# Patient Record
Sex: Female | Born: 1985
Health system: Southern US, Community
[De-identification: ages and names within clinical notes are randomized; demographics above are authoritative.]

## PROBLEM LIST (undated history)

## (undated) ENCOUNTER — Inpatient Hospital Stay (HOSPITAL_COMMUNITY): Payer: Self-pay

## (undated) DIAGNOSIS — R51 Headache: Secondary | ICD-10-CM

## (undated) DIAGNOSIS — F53 Postpartum depression: Secondary | ICD-10-CM

## (undated) DIAGNOSIS — R519 Headache, unspecified: Secondary | ICD-10-CM

## (undated) DIAGNOSIS — G7000 Myasthenia gravis without (acute) exacerbation: Secondary | ICD-10-CM

## (undated) DIAGNOSIS — O99345 Other mental disorders complicating the puerperium: Secondary | ICD-10-CM

## (undated) HISTORY — DX: Headache, unspecified: R51.9

## (undated) HISTORY — DX: Myasthenia gravis without (acute) exacerbation: G70.00

## (undated) HISTORY — DX: Other mental disorders complicating the puerperium: O99.345

## (undated) HISTORY — DX: Postpartum depression: F53.0

## (undated) HISTORY — PX: WISDOM TOOTH EXTRACTION: SHX21

## (undated) HISTORY — DX: Headache: R51

---

## 2014-07-03 NOTE — L&D Delivery Note (Signed)
Operative Delivery Note At 6:49 PM a viable and healthy female was delivered via Vaginal, Vacuum Neurosurgeon).  Presentation: vertex; Position: Occiput,, Anterior; Station: +3.  Verbal consent: obtained from patient.  Risks and benefits discussed in detail.  Risks include, but are not limited to the risks of anesthesia, bleeding, infection, damage to maternal tissues, fetal cephalhematoma.  There is also the risk of inability to effect vaginal delivery of the head, or shoulder dystocia that cannot be resolved by established maneuvers, leading to the need for emergency cesarean section.  APGAR: 9, 9; weight  6 lb 1 oz  Placenta status: Abnormal, Manual removal Pathology.   Cord: 3 vessels with the following complications: None.  Cord pH: none  Anesthesia: Epidural  Instruments: kiwi Episiotomy: None Lacerations: Labial minora( left( superficial) not bleeding Suture Repair: n/a Est. Blood Loss (mL): 250  Mom to postpartum.  Baby to Couplet care / Skin to Skin.  [name] A 05/20/2015, 8:09 PM

## 2014-10-13 LAB — US OB TRANSVAGINAL

## 2014-10-30 LAB — OB RESULTS CONSOLE RPR: RPR: NONREACTIVE

## 2014-10-30 LAB — OB RESULTS CONSOLE RUBELLA ANTIBODY, IGM: RUBELLA: IMMUNE

## 2014-10-30 LAB — OB RESULTS CONSOLE HEPATITIS B SURFACE ANTIGEN: Hepatitis B Surface Ag: NEGATIVE

## 2014-10-30 LAB — OB RESULTS CONSOLE HIV ANTIBODY (ROUTINE TESTING): HIV: NONREACTIVE

## 2014-11-06 ENCOUNTER — Ambulatory Visit (HOSPITAL_COMMUNITY): Payer: Self-pay

## 2014-11-13 ENCOUNTER — Ambulatory Visit (HOSPITAL_COMMUNITY)
Admission: RE | Admit: 2014-11-13 | Discharge: 2014-11-13 | Disposition: A | Discharge status: Home / Self Care | Payer: 59 | Source: Ambulatory Visit | Attending: Obstetrics | Admitting: Obstetrics

## 2014-11-13 ENCOUNTER — Encounter (HOSPITAL_COMMUNITY): Payer: Self-pay

## 2014-11-13 DIAGNOSIS — Z3A12 12 weeks gestation of pregnancy: Secondary | ICD-10-CM | POA: Insufficient documentation

## 2014-11-13 DIAGNOSIS — O9989 Other specified diseases and conditions complicating pregnancy, childbirth and the puerperium: Secondary | ICD-10-CM | POA: Insufficient documentation

## 2014-11-13 DIAGNOSIS — O401XX0 Polyhydramnios, first trimester, not applicable or unspecified: Secondary | ICD-10-CM | POA: Diagnosis not present

## 2014-11-13 DIAGNOSIS — G7000 Myasthenia gravis without (acute) exacerbation: Secondary | ICD-10-CM | POA: Diagnosis not present

## 2014-11-13 DIAGNOSIS — Z8739 Personal history of other diseases of the musculoskeletal system and connective tissue: Secondary | ICD-10-CM | POA: Insufficient documentation

## 2014-11-13 NOTE — Consult Note (Signed)
Maternal Fetal Medicine Consultation  Requesting Provider(s): Aloha Gell, MD  Reason for consultation: Myasthenia Gravis  HPI: Robin Robertson is a 29 yo G1P0, EDD 05/26/2015 who is currently at 12w 2d seen for consultation due to a personal history of Myasthenia Gravis.  The patient reports that she was first diagnosed with MG at age 19.  She reports that most of her symptoms have been associated with "asthma-like" symptoms and chronic muscle fatigue.  She has some chronic ptosis of the right eye.  [name] reports that she has never had a myasthenia crisis. She was previously followed by an Neuro-opthamologist in [place], but has not yet established care with a neurologist in the area.  She was previously on pyridostigmine, but has been off of it for approximately one year.  She is without complaints today.  Her prenatal course thus far has been uncomplicated.  OB History: OB History    Gravida Para Term Preterm AB TAB SAB Ectopic Multiple Living   1               PMH: Myasthenia Gravis, Migraine headaches  PSH: Wisdom teeth extraction  Meds: Prenatal vitamins  Allergies:  Allergies  Allergen Reactions  . Fentanyl   . Influenza Vaccines   . Vicodin [Hydrocodone-Acetaminophen]    FH: Ischemic heart disease, elevated cholesterol, hypertension - father; CVA - mother  Soc:  Denies tobacco or ETOH use   Review of Systems: no vaginal bleeding or cramping/contractions, no LOF, no nausea/vomiting. All other systems reviewed and are negative.   PE:  Wt: 109 lbs, 109/64, 70  A/P:  1) Single IUP at 12w 2d  2) Known history of MG - Pregnancy has a variable effect on the course of myasthenia gravis.  The first trimester and post partum time frames appear to be associated with the highest risk of exacerbations.  The patient recently moved to the area, and has not yet established care with a Neurologist.  She has an appointment next week, and would await their input for  further management.     Recommendations: 1) Neurology consultation for further management.  Anticholinesterase agents (pyridostigmine) is the standard, first line agent.  Dose adjustments may be required in pregnancy due to increased renal clearance.  Glucocorticoids, azathioprine or cyclosporine may be used if anticholinesterases fail to control MG exacerbations and are relatively safe in pregnancy. 2) Baseline pulmonary function tests - particularly in light of the patients history of respiratory issues / "asthma- like" symptoms.  Complaints of dyspnea and cough should prompt evaluation for possible myasthenic flares and respiratory muscle weakness 3) Thyroid function tests as there is an association between MG and other autoimmune disorders 4) All infections (UTI) should be treated promptly - women with underlying infections will frequently develop MG exacerbations 4)  Magnesium sulfate is contraindicated.  Hypertension can be treated with methydopa or hydralazine - avoid calcium channel blockers and beta blockers if possible.  Keppra can be used for seizure prophylaxis (Dilantin should be reserved for refractory seizures as it can potentially exacerbate weakness) 5) Recommend anesthesia consult (ideally prior to the 3rd trimester)  - particularly with the fact that the patient has some opioid allergies.  This in addition to her MG, labor analgesia is very limited.  Regional anesthesia is not contraindicated in mild to moderate disease and my be the analgesia of choice for anticipated vaginal delivery. 6) The second stage of labor may be problematic - would consider giving anticholinesterase agents parenterally.  May benefit from  operative vaginal delivery. 7) The newborn is at risk for transient MG (occurs in 10-20% of infants born to [facility] mothers) - notify Peds.  May require additional observation for the first 48-72 hours of life. 8) Fetal assessment may be affected by MG.  The perception of fetal  movement may be altered and NSTs are often inconclusive.  Decreased fetal movement and breathing activity have been described.  Polyhydramnios had been reported due to poor fetal swallowing.     Thank you for the opportunity to be a part of the care of [place]. Please contact our office if we can be of further assistance.   I spent approximately 30 minutes with this patient with over 50% of time spent in face-to-face counseling.  Benjaman Lobe, MD Maternal Fetal Medicine

## 2014-11-16 ENCOUNTER — Other Ambulatory Visit (HOSPITAL_COMMUNITY): Payer: Self-pay | Admitting: Obstetrics

## 2014-11-17 ENCOUNTER — Other Ambulatory Visit (HOSPITAL_COMMUNITY): Payer: Self-pay | Admitting: Obstetrics

## 2014-11-17 ENCOUNTER — Encounter (HOSPITAL_COMMUNITY): Payer: Self-pay | Admitting: Obstetrics

## 2014-11-24 ENCOUNTER — Encounter: Payer: Self-pay | Admitting: Neurology

## 2014-11-24 ENCOUNTER — Ambulatory Visit (INDEPENDENT_AMBULATORY_CARE_PROVIDER_SITE_OTHER): Payer: 59 | Admitting: Neurology

## 2014-11-24 VITALS — BP 100/59 | HR 83 | Ht 64.0 in | Wt 111.4 lb

## 2014-11-24 DIAGNOSIS — G7000 Myasthenia gravis without (acute) exacerbation: Secondary | ICD-10-CM

## 2014-11-24 LAB — OB RESULTS CONSOLE GC/CHLAMYDIA
Chlamydia: NEGATIVE
GC PROBE AMP, GENITAL: NEGATIVE

## 2014-11-24 NOTE — Progress Notes (Signed)
Reason for visit: Myasthenia gravis  Referring physician: Dr. Dayna Ramus Robin is a 29 y.o. female  History of present illness:  Robin Robertson is a 29 year old right-handed white female with a history of myasthenia gravis with ocular features. The patient was diagnosed 4 years ago while living in [place]. The patient moved to this area about a year ago. She was on Mestinon prior to moving, she has been off the medication for about one year. The patient mainly has ptosis involving the right eye only, she denies any double vision. She has seronegative disease, the diagnosis of made in that she had a response to Mestinon with improvement of the ptosis. The patient does not believe that she ever had a CT scan of the chest. She denies any generalized weakness, and she denies difficulty with chewing or swallowing. She does report some generalized fatigue at times, and she may have some slight shortness of breath. She currently is [redacted] weeks pregnant, her due date is on 05/26/2015. The patient is sent to this office for further evaluation.  Past Medical History  Diagnosis Date  . Myasthenia gravis   . Headache     Past Surgical History  Procedure Laterality Date  . Wisdom tooth extraction      Family History  Problem Relation Age of Onset  . Hypertension Father   . Cerebrovascular Disease Maternal Grandmother   . Heart disease Paternal Grandmother   . Myasthenia gravis Paternal Grandmother   . Hypertension Paternal Grandfather     Social history:  reports that she has never smoked. She has never used smokeless tobacco. She reports that she does not drink alcohol or use illicit drugs.  Medications:  Prior to Admission medications   Medication Sig Start Date End Date Taking? Authorizing Provider  Prenatal Vit-Fe Fumarate-FA (PRENATAL MULTIVITAMIN) TABS tablet Take 1 tablet by mouth daily at 12 noon.   Yes Historical Provider, MD      Allergies  Allergen  Reactions  . Fentanyl   . Influenza Vaccines   . Vicodin [Hydrocodone-Acetaminophen]     ROS:  Out of a complete 14 system review of symptoms, the patient complains only of the following symptoms, and all other reviewed systems are negative.  Fatigue Shortness of breath Migraine  Blood pressure 100/59, pulse 83, height 5\' 4"  (1.626 m), weight 111 lb 6.4 oz (50.531 kg), last menstrual period 08/19/2014.  Physical Exam  General: The patient is alert and cooperative at the time of the examination.  Eyes: Pupils are equal, round, and reactive to light. Discs are flat bilaterally.  Neck: The neck is supple, no carotid bruits are noted.  Respiratory: The respiratory examination is clear.  Cardiovascular: The cardiovascular examination reveals a regular rate and rhythm, no obvious murmurs or rubs are noted.  Skin: Extremities are without significant edema.  Neurologic Exam  Mental status: The patient is alert and oriented x 3 at the time of the examination. The patient has apparent normal recent and remote memory, with an apparently normal attention span and concentration ability.  Cranial nerves: Facial symmetry is not present. Prominent ptosis of the right eye is noted. There is good sensation of the face to pinprick and soft touch bilaterally. The strength of the facial muscles and the muscles to head turning and shoulder shrug are normal bilaterally. Speech is well enunciated, no aphasia or dysarthria is noted. Extraocular movements are full. Visual fields are full. The tongue is midline, and the patient has  symmetric elevation of the soft palate. No obvious hearing deficits are noted. With superior gaze for 1 minute, no increased ptosis is noted on the right. No divergence of gaze is noted, no reports of double vision is noted.  Motor: The motor testing reveals 5 over 5 strength of all 4 extremities. Good symmetric motor tone is noted throughout. With arms outstretched 1 minute, no  fatigable weakness of the deltoid muscles is noted on either side.  Sensory: Sensory testing is intact to pinprick, soft touch, vibration sensation, and position sense on all 4 extremities. No evidence of extinction is noted.  Coordination: Cerebellar testing reveals good finger-nose-finger and heel-to-shin bilaterally.  Gait and station: Gait is normal. Tandem gait is normal. Romberg is negative. No drift is seen.  Reflexes: Deep tendon reflexes are symmetric and normal bilaterally. Toes are downgoing bilaterally.   Assessment/Plan:  1. Myasthenia gravis, ocular features  2. Migraine headache  The patient has been remarkably stable with her myasthenia gravis with right-sided ptosis. The patient can be treated with Mestinon if needed. The patient has had ocular disease for 4 years, the chance of generalization of her myasthenia is low. For now, we will follow the patient conservatively. She will follow-up in about 6 months.    Jill Alexanders MD 11/24/2014 8:19 PM  [facility] [address] [place], [place] [address]  Phone [phone] Fax [phone]

## 2014-11-24 NOTE — Patient Instructions (Signed)
Myasthenia Gravis Myasthenia gravis is a disease that causes muscle weakness throughout the body. The muscles affected are the ones we can control (voluntary muscles). An example of a voluntary muscle is your hand muscles. You can control the muscles to make the hand pick something up. An example of an involuntary muscle is the heart. The heart beats without any direction from you.  Myasthenia Gravis is thought to be an autoimmune disease. That means that normal defenses of the body begin to attack the body. In this case, the immune system begins to attack cells located at the junctions of the muscles and the nerves. Women are affected more often. Women are affected at a younger age than men. Babies born to affected women frequently develop symptoms at an early age. SYMPTOMS Initially in the disease, the facial muscles are affected first. After this, a person may develop droopy eyelids. They may have difficulty controlling facial muscles. They may have problems chewing. Swallowing and speaking may become impaired. The weakness gradually spreads to the arms and legs. It begins to affect breathing. Sometimes, the symptoms lessen or go away without any apparent cause. DIAGNOSIS  Diagnosis can be made with blood tests. Tests such as electromyography may be done to examine the electrical activity in the muscle. An improvement in symptoms after having an anti-cholinesterase drug helps confirm the diagnosis.  TREATMENT  Medicines are usually prescribed as the first treatment. These medicines help, but they do not cure the disease. A plasma cleansing procedure (plasmapheresis) can be used to treat a crisis. It can also be used to prepare a person for surgery. This procedure produces short-term improvement. Some cases are helped by removing the thymus gland. Steroids are used for short-term benefits. Document Released: 09/25/2000 Document Revised: 09/11/2011 Document Reviewed: 08/20/2013 [employer] Patient  Information 2015 [employer]. This information is not intended to replace advice given to you by your health care provider. Make sure you discuss any questions you have with your health care provider.  

## 2014-12-25 ENCOUNTER — Telehealth: Payer: Self-pay | Admitting: *Deleted

## 2014-12-25 DIAGNOSIS — G7000 Myasthenia gravis without (acute) exacerbation: Secondary | ICD-10-CM

## 2014-12-25 NOTE — Telephone Encounter (Signed)
Order placed for PFT. 

## 2014-12-28 ENCOUNTER — Ambulatory Visit (INDEPENDENT_AMBULATORY_CARE_PROVIDER_SITE_OTHER): Payer: 59 | Admitting: Internal Medicine

## 2014-12-28 ENCOUNTER — Encounter (INDEPENDENT_AMBULATORY_CARE_PROVIDER_SITE_OTHER): Payer: Self-pay

## 2014-12-28 DIAGNOSIS — G7000 Myasthenia gravis without (acute) exacerbation: Secondary | ICD-10-CM | POA: Diagnosis not present

## 2014-12-28 LAB — PULMONARY FUNCTION TEST
DL/VA % pred: 104 %
DL/VA: 5.01 ml/min/mmHg/L
DLCO unc % pred: 100 %
DLCO unc: 24.35 ml/min/mmHg
FEF 25-75 Pre: 2.99 L/sec
FEF2575-%PRED-PRE: 85 %
FEV1-%PRED-PRE: 90 %
FEV1-PRE: 2.9 L
FEV1FVC-%Pred-Pre: 98 %
FEV6-%Pred-Pre: 91 %
FEV6-Pre: 3.43 L
FEV6FVC-%Pred-Pre: 101 %
FVC-%PRED-PRE: 91 %
FVC-PRE: 3.45 L
PRE FEV6/FVC RATIO: 100 %
Pre FEV1/FVC ratio: 84 %
RV % PRED: 125 %
RV: 1.72 L
TLC % pred: 100 %
TLC: 5.06 L

## 2014-12-28 NOTE — Progress Notes (Signed)
PFT done today. 

## 2014-12-30 NOTE — Progress Notes (Signed)
Quick Note:  Spoke with pt and notified of results per Dr. Young. Pt verbalized understanding and denied any questions.  ______ 

## 2014-12-30 NOTE — Progress Notes (Signed)
Quick Note:  LMTCB ______ 

## 2015-04-22 ENCOUNTER — Other Ambulatory Visit: Payer: Self-pay | Admitting: Obstetrics

## 2015-04-28 LAB — OB RESULTS CONSOLE GBS: STREP GROUP B AG: NEGATIVE

## 2015-05-07 ENCOUNTER — Encounter (HOSPITAL_COMMUNITY): Payer: Self-pay | Admitting: Anesthesiology

## 2015-05-07 ENCOUNTER — Encounter (HOSPITAL_COMMUNITY)
Admission: RE | Admit: 2015-05-07 | Discharge: 2015-05-07 | Disposition: A | Discharge status: Home / Self Care | Payer: 59 | Source: Ambulatory Visit | Attending: Obstetrics | Admitting: Obstetrics

## 2015-05-07 NOTE — Progress Notes (Signed)
63 YRO 37 week IUP for consult regarding Myasthenia Gravis and Epidural.  She was taken off her Myasthenia meds during the pregnancy and is doing well.  She had an episode of respiratory depression with Fentanyl at age 29 during a MRI and was also concerned about the Fentanyl in the Epidural.  I told her that we did not do anything different with an Epidural in a Myasthenic patient other than possibly decreasing the infusion rate.  I advised her to inform her anesthesiologist about her concerns with  Fentanyl at time of procedure.

## 2015-05-20 ENCOUNTER — Encounter (HOSPITAL_COMMUNITY): Payer: Self-pay | Admitting: *Deleted

## 2015-05-20 ENCOUNTER — Inpatient Hospital Stay (HOSPITAL_COMMUNITY): Payer: 59 | Admitting: Anesthesiology

## 2015-05-20 ENCOUNTER — Inpatient Hospital Stay (HOSPITAL_COMMUNITY)
Admission: AD | Admit: 2015-05-20 | Discharge: 2015-05-22 | DRG: 767 | Disposition: A | Discharge status: Home / Self Care | Payer: 59 | Source: Ambulatory Visit | Attending: Obstetrics and Gynecology | Admitting: Obstetrics and Gynecology

## 2015-05-20 DIAGNOSIS — O1092 Unspecified pre-existing hypertension complicating childbirth: Secondary | ICD-10-CM | POA: Diagnosis present

## 2015-05-20 DIAGNOSIS — O26893 Other specified pregnancy related conditions, third trimester: Secondary | ICD-10-CM | POA: Diagnosis present

## 2015-05-20 DIAGNOSIS — Z8249 Family history of ischemic heart disease and other diseases of the circulatory system: Secondary | ICD-10-CM | POA: Diagnosis not present

## 2015-05-20 DIAGNOSIS — Z8759 Personal history of other complications of pregnancy, childbirth and the puerperium: Secondary | ICD-10-CM

## 2015-05-20 DIAGNOSIS — Z3A39 39 weeks gestation of pregnancy: Secondary | ICD-10-CM | POA: Diagnosis not present

## 2015-05-20 DIAGNOSIS — G7000 Myasthenia gravis without (acute) exacerbation: Secondary | ICD-10-CM | POA: Diagnosis present

## 2015-05-20 LAB — TYPE AND SCREEN
ABO/RH(D): A POS
Antibody Screen: NEGATIVE

## 2015-05-20 LAB — CBC
HEMATOCRIT: 33.5 % — AB (ref 36.0–46.0)
Hemoglobin: 11.7 g/dL — ABNORMAL LOW (ref 12.0–15.0)
MCH: 32 pg (ref 26.0–34.0)
MCHC: 34.9 g/dL (ref 30.0–36.0)
MCV: 91.5 fL (ref 78.0–100.0)
PLATELETS: 171 10*3/uL (ref 150–400)
RBC: 3.66 MIL/uL — ABNORMAL LOW (ref 3.87–5.11)
RDW: 13.1 % (ref 11.5–15.5)
WBC: 9.4 10*3/uL (ref 4.0–10.5)

## 2015-05-20 LAB — RPR: RPR Ser Ql: NONREACTIVE

## 2015-05-20 LAB — ABO/RH: ABO/RH(D): A POS

## 2015-05-20 MED ORDER — LACTATED RINGERS IV SOLN
INTRAVENOUS | Status: DC
  Administered 2015-05-20: 07:00:00 via INTRAVENOUS

## 2015-05-20 MED ORDER — ONDANSETRON HCL 4 MG/2ML IJ SOLN
4.0000 mg | Freq: Four times a day (QID) | INTRAMUSCULAR | Status: DC | PRN
  Administered 2015-05-20: 4 mg via INTRAVENOUS
  Filled 2015-05-20: qty 2

## 2015-05-20 MED ORDER — OXYTOCIN 40 UNITS IN LACTATED RINGERS INFUSION - SIMPLE MED
1.0000 m[IU]/min | INTRAVENOUS | Status: DC
  Administered 2015-05-20: 2 m[IU]/min via INTRAVENOUS

## 2015-05-20 MED ORDER — OXYTOCIN 40 UNITS IN LACTATED RINGERS INFUSION - SIMPLE MED
1.0000 m[IU]/min | INTRAVENOUS | Status: DC
  Filled 2015-05-20: qty 1000

## 2015-05-20 MED ORDER — LANOLIN HYDROUS EX OINT: TOPICAL_OINTMENT | CUTANEOUS | Status: DC | PRN

## 2015-05-20 MED ORDER — ONDANSETRON HCL 4 MG PO TABS: 4.0000 mg | ORAL_TABLET | ORAL | Status: DC | PRN

## 2015-05-20 MED ORDER — OXYTOCIN BOLUS FROM INFUSION: 500.0000 mL | INTRAVENOUS | Status: DC

## 2015-05-20 MED ORDER — LACTATED RINGERS IV SOLN
500.0000 mL | INTRAVENOUS | Status: DC | PRN
  Administered 2015-05-20: 500 mL via INTRAVENOUS

## 2015-05-20 MED ORDER — LIDOCAINE HCL (PF) 1 % IJ SOLN
INTRAMUSCULAR | Status: DC | PRN
  Administered 2015-05-20 (×2): 4 mL

## 2015-05-20 MED ORDER — TERBUTALINE SULFATE 1 MG/ML IJ SOLN
0.2500 mg | Freq: Once | INTRAMUSCULAR | Status: DC | PRN
  Filled 2015-05-20: qty 1

## 2015-05-20 MED ORDER — PHENYLEPHRINE 40 MCG/ML (10ML) SYRINGE FOR IV PUSH (FOR BLOOD PRESSURE SUPPORT)
80.0000 ug | PREFILLED_SYRINGE | INTRAVENOUS | Status: DC | PRN
  Filled 2015-05-20: qty 2
  Filled 2015-05-20: qty 20

## 2015-05-20 MED ORDER — DIPHENHYDRAMINE HCL 25 MG PO CAPS: 25.0000 mg | ORAL_CAPSULE | Freq: Four times a day (QID) | ORAL | Status: DC | PRN

## 2015-05-20 MED ORDER — SENNOSIDES-DOCUSATE SODIUM 8.6-50 MG PO TABS
2.0000 | ORAL_TABLET | ORAL | Status: DC
  Administered 2015-05-22: 2 via ORAL
  Filled 2015-05-20 (×2): qty 2

## 2015-05-20 MED ORDER — OXYTOCIN 40 UNITS IN LACTATED RINGERS INFUSION - SIMPLE MED
62.5000 mL/h | INTRAVENOUS | Status: DC
  Filled 2015-05-20: qty 1000

## 2015-05-20 MED ORDER — ONDANSETRON HCL 4 MG/2ML IJ SOLN: 4.0000 mg | INTRAMUSCULAR | Status: DC | PRN

## 2015-05-20 MED ORDER — BENZOCAINE-MENTHOL 20-0.5 % EX AERO
1.0000 "application " | INHALATION_SPRAY | CUTANEOUS | Status: DC | PRN
  Administered 2015-05-20: 1 via TOPICAL
  Filled 2015-05-20: qty 56

## 2015-05-20 MED ORDER — IBUPROFEN 600 MG PO TABS
600.0000 mg | ORAL_TABLET | Freq: Four times a day (QID) | ORAL | Status: DC
  Administered 2015-05-20 – 2015-05-22 (×7): 600 mg via ORAL
  Filled 2015-05-20 (×7): qty 1

## 2015-05-20 MED ORDER — FERROUS SULFATE 325 (65 FE) MG PO TABS
325.0000 mg | ORAL_TABLET | Freq: Two times a day (BID) | ORAL | Status: DC
  Administered 2015-05-21 – 2015-05-22 (×3): 325 mg via ORAL
  Filled 2015-05-20 (×3): qty 1

## 2015-05-20 MED ORDER — LIDOCAINE HCL (PF) 1 % IJ SOLN
30.0000 mL | INTRAMUSCULAR | Status: DC | PRN
  Filled 2015-05-20: qty 30

## 2015-05-20 MED ORDER — DIPHENHYDRAMINE HCL 50 MG/ML IJ SOLN: 12.5000 mg | INTRAMUSCULAR | Status: DC | PRN

## 2015-05-20 MED ORDER — OXYCODONE-ACETAMINOPHEN 5-325 MG PO TABS: 1.0000 | ORAL_TABLET | ORAL | Status: DC | PRN

## 2015-05-20 MED ORDER — ACETAMINOPHEN 325 MG PO TABS: 650.0000 mg | ORAL_TABLET | ORAL | Status: DC | PRN

## 2015-05-20 MED ORDER — SODIUM CHLORIDE 0.9 % IV SOLN
14.0000 mL/h | INTRAVENOUS | Status: DC | PRN
  Administered 2015-05-20: 14 mL/h via EPIDURAL
  Filled 2015-05-20 (×3): qty 17

## 2015-05-20 MED ORDER — CITRIC ACID-SODIUM CITRATE 334-500 MG/5ML PO SOLN
30.0000 mL | ORAL | Status: DC | PRN
  Administered 2015-05-20: 30 mL via ORAL
  Filled 2015-05-20: qty 15

## 2015-05-20 MED ORDER — SIMETHICONE 80 MG PO CHEW: 80.0000 mg | CHEWABLE_TABLET | ORAL | Status: DC | PRN

## 2015-05-20 MED ORDER — FLEET ENEMA 7-19 GM/118ML RE ENEM: 1.0000 | ENEMA | RECTAL | Status: DC | PRN

## 2015-05-20 MED ORDER — PRENATAL MULTIVITAMIN CH
1.0000 | ORAL_TABLET | Freq: Every day | ORAL | Status: DC
  Administered 2015-05-21: 1 via ORAL
  Filled 2015-05-20: qty 1

## 2015-05-20 MED ORDER — EPHEDRINE 5 MG/ML INJ
10.0000 mg | INTRAVENOUS | Status: DC | PRN
Start: 2015-05-20 — End: 2015-05-20
  Filled 2015-05-20: qty 2

## 2015-05-20 MED ORDER — WITCH HAZEL-GLYCERIN EX PADS: 1.0000 "application " | MEDICATED_PAD | CUTANEOUS | Status: DC | PRN

## 2015-05-20 MED ORDER — OXYCODONE-ACETAMINOPHEN 5-325 MG PO TABS: 2.0000 | ORAL_TABLET | ORAL | Status: DC | PRN

## 2015-05-20 MED ORDER — ZOLPIDEM TARTRATE 5 MG PO TABS: 5.0000 mg | ORAL_TABLET | Freq: Every evening | ORAL | Status: DC | PRN

## 2015-05-20 MED ORDER — OXYTOCIN 40 UNITS IN LACTATED RINGERS INFUSION - SIMPLE MED: 1.0000 m[IU]/min | INTRAVENOUS | Status: DC

## 2015-05-20 MED ORDER — DIBUCAINE 1 % RE OINT: 1.0000 "application " | TOPICAL_OINTMENT | RECTAL | Status: DC | PRN

## 2015-05-20 NOTE — H&P (Signed)
Robin Robertson is a 29 y.o. G1P0 at [redacted]w[redacted]d presenting for SROM. Pt notes onset contractions yest afternoon, continued for much of the afternoon, SROM at 2 am and mild contractions since . Good fetal movement, No vaginal bleeding.  PNCare at [place] since 7 wks - Dated by 7 wk u/s c/w LMP - Myasthenia Gravis- had been off meds for 9 months prior to pregnancy, no meds through pregnancy. Has established with neuro. Lid lag/ eye symptoms most prominent. Plan early anasthesia consult, epidural to minimize fatigue, labor down and consider assisted delivery. Avoid mag, can use valproic acid for seizure. Avoid phenytoin. OK for pyridostigmine and steroids if needed for MG flare. Limit opiods and use pulse ox when using opiods. Treat hypertension with hydralizine or methyldopa. Avoid infection. Alert peds to risks of transient fetal MG. - growth at 37'2, 25% - Failed 1 ht GTT, nl 3 hr GTT   Prenatal Transfer Tool  Maternal Diabetes: No Genetic Screening: Normal Maternal Ultrasounds/Referrals: Normal Fetal Ultrasounds or other Referrals:  None Maternal Substance Abuse:  No Significant Maternal Medications:  None Significant Maternal Lab Results: None     OB History    Gravida Para Term Preterm AB TAB SAB Ectopic Multiple Living   1              Past Medical History  Diagnosis Date  . Myasthenia gravis ([place])   . Headache   . Myasthenia gravis [facility])    Past Surgical History  Procedure Laterality Date  . Wisdom tooth extraction     Family History: family history includes Cerebrovascular Disease in her maternal grandmother; Heart disease in her paternal grandmother; Hypertension in her father and paternal grandfather; Myasthenia gravis in her paternal grandmother. Social History:  reports that she has never smoked. She has never used smokeless tobacco. She reports that she does not drink alcohol or use illicit drugs.  Review of Systems - Negative except leakign fluid, mild  contractions   Dilation: 4 Effacement (%): 90 Station: 0 Exam by:: Dr. Valentino Saxon  Blood pressure 121/65, pulse 70, temperature 98.1 F (36.7 C), temperature source Oral, resp. rate 16, height 5\' 4"  (1.626 m), weight 61.236 kg (135 lb), last menstrual period 08/19/2014, SpO2 99 %.  Physical Exam:  Gen: well appearing, no distress Back: no CVAT Abd: gravid, NT, no RUQ pain LE: no edema, equal bilaterally, non-tender Toco: irregular, not tracing well FH: baseline 140s, accelerations present, no deceleratons, 10 beat variability  Prenatal labs: ABO, Rh: --/--/A POS (11/17 0429) Antibody: NEG (11/17 0429) Rubella: !Error! RPR: Nonreactive (04/29 0000)  HBsAg: Negative (04/29 0000)  HIV: Non-reactive (04/29 0000)  GBS: Negative (10/26 0000)  1 hr Glucola 151, nl 3 hr  Genetic screening normal quad Anatomy US normal   Assessment/Plan: 29 y.o. G1P0 at [redacted]w[redacted]d SROM, no actively laboring 6 hrs after ROM, plan pitocin now Epidural Myasthenia Gravis, see plan above. Reactive fetal testing   Arlys Scatena A. 05/20/2015, 8:56 AM

## 2015-05-20 NOTE — Lactation Note (Signed)
This note was copied from the chart of Robin [facility]. Lactation Consultation Note  Patient Name: Robin Robertson [id number] Date: 05/20/2015 Reason for consult: Initial assessment Baby at 3 hr of life and not latching. Mom has wide spaced small breast that are not compressible and very short shaft nipple at rest. Baby has a high palate. Baby did extend tongue over gum ridge, can lift tongue past midline, and had good lateralization but appears to have a hump in the middle of her tongue. Baby may need to be further assessed for a tight frenulum. Mom's L nipple looks bruised and there is stage 2 skin break down. Mom was given a #20 nipple shield that cause pinching, she went to a #24 shield and reported that it felt better. Mom was able to manually express colostrum bilaterally and bloody colostrum was noted in the shield. Discussed feeding frequency, voids, wt loss, breast changes, and nipple care. She was given a Harmony to use after feeding when she uses the shield. Lactation handouts give and she is aware of OP services along with support group.    Maternal Data Formula Feeding for Exclusion: No Has patient been taught Hand Expression?: Yes Does the patient have breastfeeding experience prior to this delivery?: No  Feeding Feeding Type: Breast Fed Length of feed: 15 min  LATCH Score/Interventions Latch: Repeated attempts needed to sustain latch, nipple held in mouth throughout feeding, stimulation needed to elicit sucking reflex. Intervention(s): Adjust position;Assist with latch;Breast compression  Audible Swallowing: A few with stimulation Intervention(s): Hand expression;Alternate breast massage  Type of Nipple: Everted at rest and after stimulation Intervention(s): Hand pump  Comfort (Breast/Nipple): Filling, red/small blisters or bruises, mild/mod discomfort  Problem noted: Mild/Moderate discomfort;Cracked, bleeding, blisters, bruises Interventions   (Cracked/bleeding/bruising/blister): Expressed breast milk to nipple Interventions (Mild/moderate discomfort): Post-pump  Hold (Positioning): Assistance needed to correctly position infant at breast and maintain latch. Intervention(s): Support Pillows;Position options  LATCH Score: 6  Lactation Tools Discussed/Used Tools: Nipple Shields Nipple shield size: 24 WIC Program: No   Consult Status Consult Status: Follow-up Date: 05/21/15 Follow-up type: In-patient    Denzil Hughes 05/20/2015, 10:35 PM

## 2015-05-20 NOTE — Anesthesia Preprocedure Evaluation (Signed)
Anesthesia Evaluation  Patient identified by MRN, date of birth, ID band Patient awake    Reviewed: Allergy & Precautions, NPO status , Patient's Chart, lab work & pertinent test results  History of Anesthesia Complications Negative for: history of anesthetic complications  Airway Mallampati: II  TM Distance: >3 FB Neck ROM: Full    Dental no notable dental hx. (+) Dental Advisory Given   Pulmonary neg pulmonary ROS,    Pulmonary exam normal breath sounds clear to auscultation       Cardiovascular negative cardio ROS Normal cardiovascular exam Rhythm:Regular Rate:Normal     Neuro/Psych  Headaches, Myasthenia Gravis, not currently taking any medication, no current symptoms, no hospitalization  Neuromuscular disease negative psych ROS   GI/Hepatic negative GI ROS, Neg liver ROS,   Endo/Other  negative endocrine ROS  Renal/GU negative Renal ROS  negative genitourinary   Musculoskeletal negative musculoskeletal ROS (+)   Abdominal   Peds negative pediatric ROS (+)  Hematology negative hematology ROS (+)   Anesthesia Other Findings   Reproductive/Obstetrics (+) Pregnancy                             Anesthesia Physical Anesthesia Plan  ASA: II  Anesthesia Plan: Epidural   Post-op Pain Management:    Induction:   Airway Management Planned:   Additional Equipment:   Intra-op Plan:   Post-operative Plan:   Informed Consent: I have reviewed the patients History and Physical, chart, labs and discussed the procedure including the risks, benefits and alternatives for the proposed anesthesia with the patient or authorized representative who has indicated his/her understanding and acceptance.   Dental advisory given  Plan Discussed with: CRNA  Anesthesia Plan Comments:         Anesthesia Quick Evaluation

## 2015-05-20 NOTE — Progress Notes (Signed)
S: Doing well, no complaints, pain  controlled with epidural placed about 1 hr ago, still with a 'hot spot' RLQ.   O: BP 106/62 mmHg  Pulse 61  Temp(Src) 98.5 F (36.9 C) (Oral)  Resp 18  Ht 5\' 4"  (1.626 m)  Wt 61.236 kg (135 lb)  BMI 23.16 kg/m2  SpO2 100%  LMP 08/19/2014   FHT:  FHR: 140s bpm, variability: moderate,  accelerations:  Present,  decelerations:  Absent UC:   regular, every 3 minutes, pit at 6 munits/ min SVE:   Dilation: 5 Effacement (%): 90 Station: 0 Exam by:: Dr Pamala Hurry   A / P:  29 y.o.  Obstetric History   G1   P0   T0   P0   A0   TAB0   SAB0   E0   M0   L0    at [redacted]w[redacted]d Augmentation of labor, progressing well  Myasthenia Gravis  Fetal Wellbeing:  Category I Pain Control:  Epidural  Anticipated MOD:  NSVD  Robin Robertson A. 05/20/2015, 1:40 PM

## 2015-05-20 NOTE — MAU Note (Signed)
Pt reports ROM at 0200, occasional uc's

## 2015-05-20 NOTE — Progress Notes (Signed)
S: Pushing  O: VE fully (+) 2  station  Tracing: baseline 145 (+) variable deceleration with ctx Ctx q 2-3 mins  IMP: Variable deceleration due to cord compression Myasthenia gravis P0 cont pushing. Watch tracing closely

## 2015-05-20 NOTE — Progress Notes (Signed)
S: epidural  O: Pitocin VE deferred  Tracing: baseline 145  Ctx q 2-3 mins  IMP:  SROM  Term gestation Myasthenia gravis P0 cont with pitocin. No plan to restart myasthenia med pp per pt

## 2015-05-20 NOTE — Anesthesia Procedure Notes (Signed)
Epidural Patient location during procedure: OB  Staffing Anesthesiologist: Marlow Berenguer Performed by: anesthesiologist   Preanesthetic Checklist Completed: patient identified, site marked, surgical consent, pre-op evaluation, timeout performed, IV checked, risks and benefits discussed and monitors and equipment checked  Epidural Patient position: sitting Prep: site prepped and draped and DuraPrep Patient monitoring: continuous pulse ox and blood pressure Approach: midline Location: L3-L4 Injection technique: LOR saline  Needle:  Needle type: Tuohy  Needle gauge: 17 G Needle length: 9 cm and 9 Needle insertion depth: 4 cm Catheter type: closed end flexible Catheter size: 19 Gauge Catheter at skin depth: 9 cm Test dose: negative  Assessment Events: blood not aspirated, injection not painful, no injection resistance, negative IV test and no paresthesia  Additional Notes Patient identified. Risks/Benefits/Options discussed with patient including but not limited to bleeding, infection, nerve damage, paralysis, failed block, incomplete pain control, headache, blood pressure changes, nausea, vomiting, reactions to medication both or allergic, itching and postpartum back pain. Confirmed with bedside nurse the patient's most recent platelet count. Confirmed with patient that they are not currently taking any anticoagulation, have any bleeding history or any family history of bleeding disorders. Patient expressed understanding and wished to proceed. All questions were answered. Sterile technique was used throughout the entire procedure. Please see nursing notes for vital signs. Test dose was given through epidural catheter and negative prior to continuing to dose epidural or start infusion. Warning signs of high block given to the patient including shortness of breath, tingling/numbness in hands, complete motor block, or any concerning symptoms with instructions to call for help. Patient was  given instructions on fall risk and not to get out of bed. All questions and concerns addressed with instructions to call with any issues or inadequate analgesia.      

## 2015-05-21 ENCOUNTER — Encounter (HOSPITAL_COMMUNITY): Payer: Self-pay | Admitting: Obstetrics and Gynecology

## 2015-05-21 LAB — CBC
HCT: 33.6 % — ABNORMAL LOW (ref 36.0–46.0)
Hemoglobin: 11.6 g/dL — ABNORMAL LOW (ref 12.0–15.0)
MCH: 31.4 pg (ref 26.0–34.0)
MCHC: 34.5 g/dL (ref 30.0–36.0)
MCV: 90.8 fL (ref 78.0–100.0)
PLATELETS: 153 10*3/uL (ref 150–400)
RBC: 3.7 MIL/uL — AB (ref 3.87–5.11)
RDW: 13.4 % (ref 11.5–15.5)
WBC: 15.3 10*3/uL — AB (ref 4.0–10.5)

## 2015-05-21 LAB — CCBB MATERNAL DONOR DRAW

## 2015-05-21 NOTE — Lactation Note (Signed)
This note was copied from the chart of Robin [facility]. Lactation Consultation Note  Patient Name: Robin Robertson [id number] Date: 05/21/2015 Reason for consult: Follow-up assessment Baby at 22 hr of life and mom reports bf is going well. Baby will latch without the shield to the R breast. The L nipple is still bruised and has 2 small scabs. Mom reports that L nipple is feeling better today. Discussed breast changes and nipple care. Given comfort gels. Mom got her DEBP from health insurance today, discussed pumping and milk storage.   Maternal Data    Feeding Feeding Type: Breast Fed Length of feed: 10 min  LATCH Score/Interventions                      Lactation Tools Discussed/Used     Consult Status Consult Status: Follow-up Date: 05/22/15 Follow-up type: In-patient    Denzil Hughes 05/21/2015, 5:00 PM

## 2015-05-21 NOTE — Anesthesia Postprocedure Evaluation (Signed)
  Anesthesia Post-op Note  Patient: Robin Robertson  Procedure(s) Performed: * No procedures listed *  Patient Location: Mother/Baby  Anesthesia Type:Epidural  Level of Consciousness: awake and alert   Airway and Oxygen Therapy: Patient Spontanous Breathing  Post-op Pain: mild  Post-op Assessment: Post-op Vital signs reviewed, Patient's Cardiovascular Status Stable, Respiratory Function Stable, No signs of Nausea or vomiting, Pain level controlled, No headache, Spinal receding and Patient able to bend at knees              Post-op Vital Signs: Reviewed  Last Vitals:  Filed Vitals:   05/21/15 0500  BP: 98/66  Pulse: 55  Temp: 36.7 C  Resp: 18    Complications: No apparent anesthesia complications

## 2015-05-21 NOTE — Lactation Note (Signed)
This note was copied from the chart of Robin [facility]. Lactation Consultation Note  Patient Name: Robin Robertson [id number] Date: 05/21/2015 Reason for consult: Difficult latch RN requested that [place] observe a feeding because she does not think baby is transferring milk. Mom was able to latch baby to both breast using a #20 shield on the R and #24 on the L, audible swallows were present on both breast. Mom was only able to keep baby on the L for 10 minutes until the pain got too bad. There was milk and blood in the shield. Mom is very tired and weepy. She did pump after taking baby off the breast. She is going to take a nap when she is done pumping. FOB will feed back whatever mom gets if baby acts hungry while mom is resting. Discussed nipple care and breast changes. Encouraged mom to keep bf and praised her efforts.   Maternal Data    Feeding Feeding Type: Breast Fed  LATCH Score/Interventions                      Lactation Tools Discussed/Used Tools: Nipple Shields   Consult Status Consult Status: Follow-up Date: 05/22/15 Follow-up type: In-patient    Denzil Hughes 05/21/2015, 10:23 PM

## 2015-05-21 NOTE — Progress Notes (Signed)
PPD #1- SVD  Subjective:   Reports feeling well Tolerating po/ No nausea or vomiting Bleeding is light Pain controlled with Motrin Up ad lib / ambulatory / voiding without problems Newborn: breastfeeding    Objective:   VS:  VS:  Filed Vitals:   05/20/15 2133 05/20/15 2238 05/21/15 0225 05/21/15 0500  BP: 101/55 104/52 104/54 98/66  Pulse: 64 63 60 55  Temp: 98.7 F (37.1 C) 98.6 F (37 C) 98.6 F (37 C) 98 F (36.7 C)  TempSrc: Oral Oral Oral Oral  Resp: 18 18 18 18   Height:      Weight:      SpO2: 97% 98%      LABS:  Recent Labs  05/20/15 0430 05/21/15 0528  WBC 9.4 15.3*  HGB 11.7* 11.6*  PLT 171 153   Blood type: --/--/A POS (11/17 0430) Rubella: Immune (04/29 0000)   I&O: Intake/Output      11/17 0701 - 11/18 0700 11/18 0701 - 11/19 0700   Urine (mL/kg/hr) 500 (0.3)    Blood 302 (0.2)    Total Output 802     Net -802          Urine Occurrence 2 x      Physical Exam: Alert and oriented x3 Abdomen: soft, non-tender, non-distended  Fundus: firm, non-tender, U-3 Perineum:  Lochia: small Extremities: No edema, no calf pain or tenderness   Assessment:  PPD #1 G1P1001/ S/P:vacuum extraction, labial laceration  Doing well   Plan: Continue routine post partum orders Anticipate D/C home tomorrow Tdap current-declines flu vaccine   Julianne Handler, N MSN, CNM 05/21/2015, 9:21 AM

## 2015-05-22 MED ORDER — IBUPROFEN 600 MG PO TABS: 600.0000 mg | ORAL_TABLET | Freq: Four times a day (QID) | ORAL | Status: DC

## 2015-05-22 NOTE — Discharge Summary (Signed)
Obstetric Discharge Summary Reason for Admission: rupture of membranes and [redacted] weeks gestation Prenatal Course: Dated by 7 wk u/s c/w LMP, Myasthenia Gravis, growth at 37'2, 25%, Failed 1 ht GTT, nl 3 hr GTT Intrapartum Procedures: vacuum and Pitocin, epidural Postpartum Procedures: none Complications-Operative and Postpartum: labial laceration HEMOGLOBIN  Date Value Ref Range Status  05/21/2015 11.6* 12.0 - 15.0 g/dL Final   HCT  Date Value Ref Range Status  05/21/2015 33.6* 36.0 - 46.0 % Final    Physical Exam:  General: alert, cooperative and no distress Lochia: appropriate Uterine Fundus: firm Incision: healing well, no significant drainage, no dehiscence, no significant erythema DVT Evaluation: No evidence of DVT seen on physical exam. Negative Homan's sign. No cords or calf tenderness. No significant calf/ankle edema.  Discharge Diagnoses: Term Pregnancy-delivered  Discharge Information: Date: 05/22/2015 Activity: pelvic rest Diet: routine Medications: PNV and Ibuprofen Condition: stable Instructions: refer to practice specific booklet Discharge to: home Follow-up Information    Follow up with [facility] A., MD. Schedule an appointment as soon as possible for a visit in 6 weeks.   Specialty:  Obstetrics and Gynecology   Contact information:   [place] [address] [phone]       Newborn Data: Live born female on 05/20/15 Birth Weight: 6 lb 1.4 oz (2761 g) APGAR: 9, 9  Home with mother.  Himmat Enberg, [place], N 05/22/2015, 11:02 AM

## 2015-05-22 NOTE — Progress Notes (Signed)
PPD #2- SVD  Subjective:   Reports feeling well, ready for discharge Tolerating po/ No nausea or vomiting Bleeding is light Pain controlled with Motrin Up ad lib / ambulatory / voiding without problems Newborn: breastfeeding    Objective:   VS: VS:  Filed Vitals:   05/21/15 0500 05/21/15 1032 05/21/15 1810 05/22/15 0646  BP: 98/66 93/55 110/57 96/56  Pulse: 55 70 61 98  Temp: 98 F (36.7 C) 98.3 F (36.8 C) 98.7 F (37.1 C) 98.3 F (36.8 C)  TempSrc: Oral Oral Axillary Oral  Resp: 18 20 20 16   Height:      Weight:      SpO2:        LABS:  Recent Labs  05/20/15 0430 05/21/15 0528  WBC 9.4 15.3*  HGB 11.7* 11.6*  PLT 171 153   Blood type: --/--/A POS (11/17 0430) Rubella: Immune (04/29 0000)                I&O: Intake/Output      11/18 0701 - 11/19 0700 11/19 0701 - 11/20 0700   Urine (mL/kg/hr)     Blood     Total Output       Net              Physical Exam: Alert and oriented X3 Abdomen: soft, non-tender, non-distended  Fundus: firm, non-tender, U-2 Perineum: Well approximated, no significant erythema, edema, or drainage; healing well. Lochia: small Extremities: No edema, no calf pain or tenderness   Assessment: PPD #2  G1P1001/ S/P:vacuum extraction, labial laceration Doing well - stable for discharge home  Plan: Discharge home RX's:  Ibuprofen 600mg  po Q 6 hrs prn pain #30 Refill x 0 Follow up in 6 wks at [facility] for postpartum visit [place] given    Julianne Handler, N MSN, CNM 05/22/2015, 9:29 AM

## 2015-06-15 ENCOUNTER — Encounter: Payer: Self-pay | Admitting: Neurology

## 2015-06-15 ENCOUNTER — Ambulatory Visit (INDEPENDENT_AMBULATORY_CARE_PROVIDER_SITE_OTHER): Payer: 59 | Admitting: Neurology

## 2015-06-15 VITALS — BP 104/57 | HR 72 | Ht 64.0 in | Wt 119.5 lb

## 2015-06-15 DIAGNOSIS — G7000 Myasthenia gravis without (acute) exacerbation: Secondary | ICD-10-CM | POA: Diagnosis not present

## 2015-06-15 NOTE — Patient Instructions (Signed)
Myasthenia Gravis Myasthenia gravis (MG) means severe weakness. It is a long-term (chronic) condition that causes weakness in the muscles you can control (voluntary muscles). MG can affect any voluntary muscle. The muscles most often affected are the ones that control:   Eye movement.  Facial movements.  Swallowing. MG is an autoimmune disease, which means that your body's defense system (immune system) attacks healthy parts of your body instead of germs and other things that make you sick. When you have MG, your immune system makes proteins (antibodies) that block the chemical (acetylcholine) your body needs to send nerve signals to your muscles. This causes muscle weakness. CAUSES  The exact cause of MG is unknown. One possible cause is an enlarged thymus gland, which is located under your breastbone.  SIGNS AND SYMPTOMS The earliest symptom of MG is muscle weakness that gets worse with activity and gets better after rest. Other symptoms of MG may include:  Drooping eyelids.  Double vision.  Loss of facial expression.  Trouble chewing and swallowing.  Slurred speech.  A waddling walk.  Weakness of the arms, hands, and legs. Trouble breathing is the most dangerous symptom of MG. Sudden and severe difficulty breathing (myasthenic crisis) may require emergency breathing support. This symptom sometimes happens after:   Infection.  Fever.  Drug reaction. DIAGNOSIS  It can be hard to diagnose MG because muscle weakness is a common symptom in many conditions. Your health care provider will do a physical exam. You may also have tests that will help make a diagnosis. These may include:  A blood test.  A test using the medicine edrophonium. This medicine increases muscle strength by slowing the breakdown of acetylcholine.  Tests to measure nerve conduction to muscle (electromyography).  An imaging study of the chest (CT or MRI). TREATMENT  Treatment can improve muscle strength.  Sometimes symptoms of MG go away for a while (remission) and you can stop treatment. Possible treatments include:  Medicine.  Removal of the thymus gland (thymectomy). This may result in a long remission for some people. HOME CARE INSTRUCTIONS  Take medicines only as directed by your health care provider.  Get plenty of rest to conserve your energy.  Take frequent breaks to rest your eyes.  Maintain a healthy diet and a healthy weight.  Do not use any tobacco products including cigarettes, chewing tobacco, or electronic cigarettes. If you need help quitting, ask your health care provider.  Keep all follow-up visits as directed by your health care provider. This is important. SEEK MEDICAL CARE IF:  Your symptoms get worse after a fever or infection.  You have a reaction to a medicine you are taking.  Your symptoms change or get worse. SEEK IMMEDIATE MEDICAL CARE IF: You have trouble breathing.    This information is not intended to replace advice given to you by your health care provider. Make sure you discuss any questions you have with your health care provider.   Document Released: 09/25/2000 Document Revised: 07/10/2014 Document Reviewed: 08/20/2013 Elsevier Interactive Patient Education 2016 Elsevier Inc.  

## 2015-06-15 NOTE — Progress Notes (Signed)
Reason for visit: Myasthenia gravis  Robin Robertson is an 29 y.o. female  History of present illness:  Robin Robertson is a 29 year old right-handed white female with a history of myasthenia gravis with ocular features. The patient has not required any immunosuppressant medications, she has Mestinon to take, but she does not take this on a regular basis. The patient has some intermittent ptosis involving the right eye, she denies any significant problems with double vision, chewing problems, or swallowing problems. She has no weakness of the extremities. She has had the disease for about 4 and half years without progression. She recently delivered a child within the last 3-1/2 weeks. She is doing well following delivery. She comes to this office for an evaluation.  Past Medical History  Diagnosis Date  . Myasthenia gravis ([place])   . Headache   . Myasthenia gravis [facility])     Past Surgical History  Procedure Laterality Date  . Wisdom tooth extraction      Family History  Problem Relation Age of Onset  . Hypertension Father   . Cerebrovascular Disease Maternal Grandmother   . Heart disease Paternal Grandmother   . Myasthenia gravis Paternal Grandmother   . Hypertension Paternal Grandfather     Social history:  reports that she has never smoked. She has never used smokeless tobacco. She reports that she does not drink alcohol or use illicit drugs.    Allergies  Allergen Reactions  . Fentanyl Anaphylaxis    Stopped breathing, heart rate dropped to 8bpm  . Influenza Vaccines Shortness Of Breath    Chest pain, Left side of body went numb for 2 weeks  . Vicodin [Hydrocodone-Acetaminophen] Rash    Anger and Black outs    Medications:  Prior to Admission medications   Medication Sig Start Date End Date Taking? Authorizing Provider  ibuprofen (ADVIL,MOTRIN) 600 MG tablet Take 1 tablet (600 mg total) by mouth every 6 (six) hours. 05/22/15  Yes Graciela Husbands, CNM  Prenatal  Multivit-Min-Fe-FA (PRENATAL VITAMINS PO) Take 1 tablet by mouth daily.   Yes Historical Provider, MD    ROS:  Out of a complete 14 system review of symptoms, the patient complains only of the following symptoms, and all other reviewed systems are negative.  Headache, numbness  Blood pressure 104/57, pulse 72, height 5\' 4"  (1.626 m), weight 119 lb 8 oz (54.205 kg), unknown if currently breastfeeding.  Physical Exam  General: The patient is alert and cooperative at the time of the examination.  Skin: No significant peripheral edema is noted.   Neurologic Exam  Mental status: The patient is alert and oriented x 3 at the time of the examination. The patient has apparent normal recent and remote memory, with an apparently normal attention span and concentration ability.   Cranial nerves: Facial symmetry is not present. Ptosis of the right eye is noted. Speech is normal, no aphasia or dysarthria is noted. Extraocular movements are full. Visual fields are full. With superior gaze for 1 minute, no increase in ptosis was noted on the right eye, no reports of subjective double vision was noted.  Motor: The patient has good strength in all 4 extremities. With arms outstretched 1 minute, no fatigable weakness of the deltoid muscles was seen.  Sensory examination: Soft touch sensation is symmetric on the face, arms, and legs.  Coordination: The patient has good finger-nose-finger and heel-to-shin bilaterally.  Gait and station: The patient has a normal gait. Tandem gait is normal. Romberg is  negative. No drift is seen.  Reflexes: Deep tendon reflexes are symmetric.   Assessment/Plan:  1. Myasthenia gravis  The patient is doing well at this time, she has ocular myasthenia that has been relatively stable. She has Mestinon if she needs it to take. She will follow-up in one year, sooner if needed. The patient can take Mestinon while breast-feeding.  Jill Alexanders MD 06/15/2015 6:48  PM  [facility] [address] [place], [place] [address]  Phone [phone] Fax [phone]

## 2015-07-15 DIAGNOSIS — F331 Major depressive disorder, recurrent, moderate: Secondary | ICD-10-CM | POA: Diagnosis not present

## 2015-09-01 ENCOUNTER — Ambulatory Visit (INDEPENDENT_AMBULATORY_CARE_PROVIDER_SITE_OTHER): Payer: 59 | Admitting: Osteopathic Medicine

## 2015-09-01 ENCOUNTER — Encounter: Payer: Self-pay | Admitting: Osteopathic Medicine

## 2015-09-01 VITALS — BP 113/68 | HR 63 | Ht 64.0 in | Wt 113.0 lb

## 2015-09-01 DIAGNOSIS — R42 Dizziness and giddiness: Secondary | ICD-10-CM | POA: Insufficient documentation

## 2015-09-01 DIAGNOSIS — R002 Palpitations: Secondary | ICD-10-CM | POA: Diagnosis not present

## 2015-09-01 NOTE — Progress Notes (Signed)
HPI: Robin Robertson is a 30 y.o. female who presents to [facility] today for chief complaint of:  Chief Complaint  Patient presents with  . Establish Care    Postpartum Dizziness    CHEST PAINS & DIZZINESS . Location: all over chest, R and L  . Quality: sharp . Severity: were bad but have basically gone away, now biggest problems is dizziness . Duration: dizziness about a month . Timing: multiple times per day, every day . Context: 15 weeks postpartum, her supervisor (works as Economist) scanned her and saw nothing, no formal read was done.  . Modifying factors: nothing makes bette or worse, no orthostatic symptoms unless she is already feeling dizzy sitting down then stands up and it's worse . Assoc signs/symptoms: dizziness, which is getting worse lately. Having nightmares, night terrors. No alarm signs for brainstem - ataxia, n/v, vision change, weakness, trouble with balance/coordination.   OTHER: Hx Myasthenia Gravis, following with Neuro, Dr Jannifer Franklin, last appt 06/15/15 - Mestinon prn and f/u 1 year. PFT 12/28/14 WNL   Past medical, social and family history reviewed: Past Medical History  Diagnosis Date  . Myasthenia gravis ([place])   . Headache   . Myasthenia gravis [facility])    Past Surgical History  Procedure Laterality Date  . Wisdom tooth extraction     Social History  Substance Use Topics  . Smoking status: Never Smoker   . Smokeless tobacco: Never Used  . Alcohol Use: No   Family History  Problem Relation Age of Onset  . Hypertension Father   . Cerebrovascular Disease Maternal Grandmother   . Heart disease Paternal Grandmother   . Myasthenia gravis Paternal Grandmother   . Hypertension Paternal Grandfather     Current Outpatient Prescriptions  Medication Sig Dispense Refill  . Prenatal Multivit-Min-Fe-FA (PRENATAL VITAMINS PO) Take 1 tablet by mouth daily.     No current facility-administered medications for this visit.    Allergies  Allergen Reactions  . Fentanyl Anaphylaxis    Stopped breathing, heart rate dropped to 8bpm  . Influenza Vaccines Shortness Of Breath    Chest pain, Left side of body went numb for 2 weeks  . Vicodin [Hydrocodone-Acetaminophen] Rash    Anger and Black outs      Review of Systems: CONSTITUTIONAL:  No  fever, no chills, No  unintentional weight changes HEAD/EYES/EARS/NOSE/THROAT: (+) headache, no vision change, no hearing change, No  sore throat, No  sinus pressure, (+)allergies CARDIAC: (+) chest pain, No  pressure, (+) palpitations, No  orthopnea RESPIRATORY: No  cough, No  shortness of breath/wheeze GASTROINTESTINAL: No  nausea, No  vomiting, No  abdominal pain, No  blood in stool, No  diarrhea, No  constipation  MUSCULOSKELETAL: No  myalgia/arthralgia GENITOURINARY: No  incontinence, No  abnormal genital bleeding/discharge SKIN: No  rash/wounds/concerning lesions HEM/ONC: No  easy bruising/bleeding, No  abnormal lymph node ENDOCRINE: No polyuria/polydipsia/polyphagia, No  heat/cold intolerance  NEUROLOGIC: No  weakness, No  dizziness, No  slurred speech PSYCHIATRIC: No  concerns with depression though reports hx of depression, (+) concerns with anxiety, (+) sleep problems, PHQ2 (-)  Exam:  BP 113/68 mmHg  Pulse 63  Ht 5\' 4"  (1.626 m)  Wt 113 lb (51.256 kg)  BMI 19.39 kg/m2 Constitutional: VS see above. General Appearance: alert, well-developed, well-nourished, NAD Eyes: Normal lids and conjunctive, non-icteric sclera, PERRLA, EOMI Ears, Nose, Mouth, Throat: MMM, Normal external inspection ears/nares/mouth/lips/gums, TM normal bilaterally. Pharynx no erythema, no exudate.  Neck:  No masses, trachea midline. No thyroid enlargement/tenderness/mass appreciated. No lymphadenopathy Respiratory: Normal respiratory effort. no wheeze, no rhonchi, no rales Cardiovascular: S1/S2 normal, no murmur, no rub/gallop auscultated. RRR. No lower extremity edema. Gastrointestinal:  Nontender, no masses. Musculoskeletal: Gait normal. No clubbing/cyanosis of digits.  Neurological: No cranial nerve deficit on limited exam. Motor and sensation intact and symmetric. Dix-Hallpike maneuver no nystagmus or replication of symptoms. Cerebellar reflexes intact.   Psychiatric: Normal judgment/insight. Normal mood and affect. Oriented x3.    No results found for this or any previous visit (from the past 72 hour(s)).  EKG interpretation: Rate: 61 Rhythm: sinus No ST/T changes concerning for acute ischemia/infarct   Orthostatic VS for the past 24 hrs:  BP- Lying Pulse- Lying BP- Sitting Pulse- Sitting BP- Standing at 0 minutes Pulse- Standing at 0 minutes  09/01/15 1603 97/53 mmHg 59 106/62 mmHg 68 100/60 mmHg 68    ASSESSMENT/PLAN: Concern for postpartum CM vs postpartum thyroid issue vs BPPV vs other dizziness cause unrelated to postpartum status, will get labs done today and will consider formal echo, pt states palpitations aren't really an issue as much as dizziness, her palpitations/chest pain has resolved at this time but could still consider Holter monitor. She is established with a neurologist, will consider reaching out to him if symptoms do not improve or worsen and all other w/u negative, no alarm symptoms at this time which would prompt imaging but may consider this if no better. Pt breastfeeding and declines symptomatic care with meclizine. ER/RTC precautions reviewed.   Dizziness - Plan: CBC, COMPLETE METABOLIC PANEL WITH GFR, Magnesium, TSH, Urinalysis  Palpitations - Plan: CBC, COMPLETE METABOLIC PANEL WITH GFR, Magnesium, TSH, Urinalysis, EKG 12-Lead   Return depends on lab results and if symptoms are persistent/worse over the next week or two.

## 2015-09-02 LAB — COMPLETE METABOLIC PANEL WITH GFR
ALT: 22 U/L (ref 6–29)
AST: 22 U/L (ref 10–30)
Albumin: 4.7 g/dL (ref 3.6–5.1)
Alkaline Phosphatase: 46 U/L (ref 33–115)
BUN: 14 mg/dL (ref 7–25)
CHLORIDE: 102 mmol/L (ref 98–110)
CO2: 23 mmol/L (ref 20–31)
Calcium: 9.7 mg/dL (ref 8.6–10.2)
Creat: 0.66 mg/dL (ref 0.50–1.10)
GFR, Est African American: >89 mL/min (ref >=60)
GFR, Est Non African American: >89 mL/min (ref >=60)
GLUCOSE: 75 mg/dL (ref 65–99)
POTASSIUM: 3.7 mmol/L (ref 3.5–5.3)
SODIUM: 139 mmol/L (ref 135–146)
Total Bilirubin: 0.7 mg/dL (ref 0.2–1.2)
Total Protein: 7 g/dL (ref 6.1–8.1)

## 2015-09-02 LAB — CBC
HCT: 42.3 % (ref 36.0–46.0)
Hemoglobin: 14 g/dL (ref 12.0–15.0)
MCH: 29.7 pg (ref 26.0–34.0)
MCHC: 33.1 g/dL (ref 30.0–36.0)
MCV: 89.6 fL (ref 78.0–100.0)
MPV: 10 fL (ref 8.6–12.4)
PLATELETS: 263 10*3/uL (ref 150–400)
RBC: 4.72 MIL/uL (ref 3.87–5.11)
RDW: 13.6 % (ref 11.5–15.5)
WBC: 8.8 10*3/uL (ref 4.0–10.5)

## 2015-09-02 LAB — URINALYSIS
Bilirubin Urine: NEGATIVE
Glucose, UA: NEGATIVE
Hgb urine dipstick: NEGATIVE
KETONES UR: NEGATIVE
LEUKOCYTES UA: NEGATIVE
NITRITE: NEGATIVE
PH: 6.5 (ref 5.0–8.0)
PROTEIN: NEGATIVE
SPECIFIC GRAVITY, URINE: 1.019 (ref 1.001–1.035)

## 2015-09-02 LAB — TSH: TSH: 0.78 mIU/L

## 2015-09-02 LAB — MAGNESIUM: Magnesium: 2.1 mg/dL (ref 1.5–2.5)

## 2015-09-03 DIAGNOSIS — O906 Postpartum mood disturbance: Secondary | ICD-10-CM | POA: Diagnosis not present

## 2015-09-07 DIAGNOSIS — F331 Major depressive disorder, recurrent, moderate: Secondary | ICD-10-CM | POA: Diagnosis not present

## 2015-09-10 DIAGNOSIS — H5213 Myopia, bilateral: Secondary | ICD-10-CM | POA: Diagnosis not present

## 2015-09-12 DIAGNOSIS — J111 Influenza due to unidentified influenza virus with other respiratory manifestations: Secondary | ICD-10-CM | POA: Diagnosis not present

## 2015-09-20 DIAGNOSIS — F331 Major depressive disorder, recurrent, moderate: Secondary | ICD-10-CM | POA: Diagnosis not present

## 2015-10-06 DIAGNOSIS — F331 Major depressive disorder, recurrent, moderate: Secondary | ICD-10-CM | POA: Diagnosis not present

## 2015-10-08 DIAGNOSIS — Z3202 Encounter for pregnancy test, result negative: Secondary | ICD-10-CM | POA: Diagnosis not present

## 2015-11-02 DIAGNOSIS — F331 Major depressive disorder, recurrent, moderate: Secondary | ICD-10-CM | POA: Diagnosis not present

## 2015-12-23 DIAGNOSIS — F331 Major depressive disorder, recurrent, moderate: Secondary | ICD-10-CM | POA: Diagnosis not present

## 2016-06-14 ENCOUNTER — Ambulatory Visit: Payer: 59 | Admitting: Adult Health

## 2016-06-21 ENCOUNTER — Ambulatory Visit (INDEPENDENT_AMBULATORY_CARE_PROVIDER_SITE_OTHER): Payer: 59 | Admitting: Adult Health

## 2016-06-21 ENCOUNTER — Encounter: Payer: Self-pay | Admitting: Adult Health

## 2016-06-21 VITALS — BP 102/54 | HR 90 | Ht 64.0 in | Wt 114.4 lb

## 2016-06-21 DIAGNOSIS — G7000 Myasthenia gravis without (acute) exacerbation: Secondary | ICD-10-CM | POA: Diagnosis not present

## 2016-06-21 DIAGNOSIS — G43019 Migraine without aura, intractable, without status migrainosus: Secondary | ICD-10-CM | POA: Diagnosis not present

## 2016-06-21 NOTE — Patient Instructions (Addendum)
Start Gabapentin 100 mg at bedtime for 1 week for migraine then increase to 2 tablets (200 mg) at bedtime If your symptoms worsen or you develop new symptoms please let us know.  Please let me know if you restart mestinon.  Gabapentin capsules or tablets What is this medicine? GABAPENTIN (GA ba pen tin) is used to control partial seizures in adults with epilepsy. It is also used to treat certain types of nerve pain. This medicine may be used for other purposes; ask your health care provider or pharmacist if you have questions. COMMON BRAND NAME(S): Active-PAC with Gabapentin, Gabarone, Neurontin What should I tell my health care provider before I take this medicine? They need to know if you have any of these conditions: -kidney disease -suicidal thoughts, plans, or attempt; a previous suicide attempt by you or a family member -an unusual or allergic reaction to gabapentin, other medicines, foods, dyes, or preservatives -pregnant or trying to get pregnant -breast-feeding How should I use this medicine? Take this medicine by mouth with a glass of water. Follow the directions on the prescription label. You can take it with or without food. If it upsets your stomach, take it with food.Take your medicine at regular intervals. Do not take it more often than directed. Do not stop taking except on your doctor's advice. If you are directed to break the 600 or 800 mg tablets in half as part of your dose, the extra half tablet should be used for the next dose. If you have not used the extra half tablet within 28 days, it should be thrown away. A special MedGuide will be given to you by the pharmacist with each prescription and refill. Be sure to read this information carefully each time. Talk to your pediatrician regarding the use of this medicine in children. Special care may be needed. Overdosage: If you think you have taken too much of this medicine contact a poison control center or emergency room at  once. NOTE: This medicine is only for you. Do not share this medicine with others. What if I miss a dose? If you miss a dose, take it as soon as you can. If it is almost time for your next dose, take only that dose. Do not take double or extra doses. What may interact with this medicine? Do not take this medicine with any of the following medications: -other gabapentin products This medicine may also interact with the following medications: -alcohol -antacids -antihistamines for allergy, cough and cold -certain medicines for anxiety or sleep -certain medicines for depression or psychotic disturbances -homatropine; hydrocodone -naproxen -narcotic medicines (opiates) for pain -phenothiazines like chlorpromazine, mesoridazine, prochlorperazine, thioridazine This list may not describe all possible interactions. Give your health care provider a list of all the medicines, herbs, non-prescription drugs, or dietary supplements you use. Also tell them if you smoke, drink alcohol, or use illegal drugs. Some items may interact with your medicine. What should I watch for while using this medicine? Visit your doctor or health care professional for regular checks on your progress. You may want to keep a record at home of how you feel your condition is responding to treatment. You may want to share this information with your doctor or health care professional at each visit. You should contact your doctor or health care professional if your seizures get worse or if you have any new types of seizures. Do not stop taking this medicine or any of your seizure medicines unless instructed by your doctor or  health care professional. Stopping your medicine suddenly can increase your seizures or their severity. Wear a medical identification bracelet or chain if you are taking this medicine for seizures, and carry a card that lists all your medications. You may get drowsy, dizzy, or have blurred vision. Do not drive, use  machinery, or do anything that needs mental alertness until you know how this medicine affects you. To reduce dizzy or fainting spells, do not sit or stand up quickly, especially if you are an older patient. Alcohol can increase drowsiness and dizziness. Avoid alcoholic drinks. Your mouth may get dry. Chewing sugarless gum or sucking hard candy, and drinking plenty of water will help. The use of this medicine may increase the chance of suicidal thoughts or actions. Pay special attention to how you are responding while on this medicine. Any worsening of mood, or thoughts of suicide or dying should be reported to your health care professional right away. Women who become pregnant while using this medicine may enroll in the [place] Pregnancy Registry by calling ([phone]. This registry collects information about the safety of antiepileptic drug use during pregnancy. What side effects may I notice from receiving this medicine? Side effects that you should report to your doctor or health care professional as soon as possible: -allergic reactions like skin rash, itching or hives, swelling of the face, lips, or tongue -worsening of mood, thoughts or actions of suicide or dying Side effects that usually do not require medical attention (report to your doctor or health care professional if they continue or are bothersome): -constipation -difficulty walking or controlling muscle movements -dizziness -nausea -slurred speech -tiredness -tremors -weight gain This list may not describe all possible side effects. Call your doctor for medical advice about side effects. You may report side effects to FDA at 1-800-FDA-1088. Where should I keep my medicine? Keep out of reach of children. This medicine may cause accidental overdose and death if it taken by other adults, children, or pets. Mix any unused medicine with a substance like cat litter or coffee grounds. Then throw the  medicine away in a sealed container like a sealed bag or a coffee can with a lid. Do not use the medicine after the expiration date. Store at room temperature between 15 and 30 degrees C (59 and 86 degrees F). NOTE: This sheet is a summary. It may not cover all possible information. If you have questions about this medicine, talk to your doctor, pharmacist, or health care provider.  2017 Elsevier/Gold Standard (2013-08-15 15:26:50)  Myasthenia Gravis Introduction Myasthenia gravis (MG) means severe weakness. It is a long-term (chronic) condition that causes weakness in the muscles you can control (voluntary muscles). MG can affect any voluntary muscle. The muscles most often affected are the ones that control:  Eye movement.  Facial movements.  Swallowing. MG is an autoimmune disease, which means that your body's defense system (immune system) attacks healthy parts of your body instead of germs and other things that make you sick. When you have MG, your immune system makes proteins (antibodies) that block the chemical (acetylcholine) your body needs to send nerve signals to your muscles. This causes muscle weakness. What are the causes? The exact cause of MG is unknown. One possible cause is an enlarged thymus gland, which is located under your breastbone. What are the signs or symptoms? The earliest symptom of MG is muscle weakness that gets worse with activity and gets better after rest. Other symptoms of MG may include:  Drooping eyelids.  Double vision.  Loss of facial expression.  Trouble chewing and swallowing.  Slurred speech.  A waddling walk.  Weakness of the arms, hands, and legs. Trouble breathing is the most dangerous symptom of MG. Sudden and severe difficulty breathing (myasthenic crisis) may require emergency breathing support. This symptom sometimes happens after:  Infection.  Fever.  Drug reaction. How is this diagnosed? It can be hard to diagnose MG because  muscle weakness is a common symptom in many conditions. Your health care provider will do a physical exam. You may also have tests that will help make a diagnosis. These may include:  A blood test.  A test using the medicine edrophonium. This medicine increases muscle strength by slowing the breakdown of acetylcholine.  Tests to measure nerve conduction to muscle (electromyography).  An imaging study of the chest (CT or MRI). How is this treated? Treatment can improve muscle strength. Sometimes symptoms of MG go away for a while (remission) and you can stop treatment. Possible treatments include:  Medicine.  Removal of the thymus gland (thymectomy). This may result in a long remission for some people. Follow these instructions at home:  Take medicines only as directed by your health care provider.  Get plenty of rest to conserve your energy.  Take frequent breaks to rest your eyes.  Maintain a healthy diet and a healthy weight.  Do not use any tobacco products including cigarettes, chewing tobacco, or electronic cigarettes. If you need help quitting, ask your health care provider.  Keep all follow-up visits as directed by your health care provider. This is important. Contact a health care provider if:  Your symptoms get worse after a fever or infection.  You have a reaction to a medicine you are taking.  Your symptoms change or get worse. Get help right away if: You have trouble breathing. This information is not intended to replace advice given to you by your health care provider. Make sure you discuss any questions you have with your health care provider. Document Released: 09/25/2000 Document Revised: 11/25/2015 Document Reviewed: 08/20/2013  2017 Elsevier

## 2016-06-21 NOTE — Progress Notes (Signed)
PATIENT: Robin Robertson DOB: 09-06-1985  REASON FOR VISIT: follow up- myasthenia gravis HISTORY FROM: patient  HISTORY OF PRESENT ILLNESS: Robin Robertson is a 30 year old female with a history of myasthenia gravis with ocular features. She returns today for follow-up. She reports that she is not on any medication currently. She reports that she was on Mestinon in the past but decided to come off this medication when she got pregnant. She states that her symptoms have remained stable. Her daughter is now 92 years old. She states that she always has ptosis of the right eye. It does get worse with fatigue. Denies diplopia. She states that she does fatigue easily. She states that if she is doing any repetitive activity she does notice weakness and fatigue. Denies any trouble chewing. Denies any trouble breathing. Most recently she's had trouble with migraine headaches. She states that she's had migraines her whole life. However after she stopped breast-feeding they have gotten worse. She also reports that the barometric pressure as a trigger for her migraines. She states her headaches typically occur on the right side or in the occipital region. She does report photophobia and phonophobia. Denies nausea or vomiting. Denies visual disturbance prior to the headache. Occasionally with severe headache she will have blurry vision. In the past she's been on amitriptyline and Imitrex. She states in the last month's she's had at least 2 headaches a week. She is interested in trying a daily medication to get control of her headaches. She returns today for an evaluation.  HISTORY 06/15/15:  Robin Robertson is a 30 year old right-handed white female with a history of myasthenia gravis with ocular features. The patient has not required any immunosuppressant medications, she has Mestinon to take, but she does not take this on a regular basis. The patient has some intermittent ptosis involving the right eye, she denies  any significant problems with double vision, chewing problems, or swallowing problems. She has no weakness of the extremities. She has had the disease for about 4 and half years without progression. She recently delivered a child within the last 3-1/2 weeks. She is doing well following delivery. She comes to this office for an evaluation.  REVIEW OF SYSTEMS: Out of a complete 14 system review of symptoms, the patient complains only of the following symptoms, and all other reviewed systems are negative.  Dizziness, headache, runny nose  ALLERGIES: Allergies  Allergen Reactions  . Fentanyl Anaphylaxis    Stopped breathing, heart rate dropped to 8bpm  . Influenza Vaccines Shortness Of Breath    Chest pain, Left side of body went numb for 2 weeks  . Vicodin [Hydrocodone-Acetaminophen] Rash    Anger and Black outs    HOME MEDICATIONS: Outpatient Medications Prior to Visit  Medication Sig Dispense Refill  . Prenatal Multivit-Min-Fe-FA (PRENATAL VITAMINS PO) Take 1 tablet by mouth daily.     No facility-administered medications prior to visit.     PAST MEDICAL HISTORY: Past Medical History:  Diagnosis Date  . Headache   . Myasthenia gravis ([place])   . Myasthenia gravis ([place])     PAST SURGICAL HISTORY: Past Surgical History:  Procedure Laterality Date  . WISDOM TOOTH EXTRACTION      FAMILY HISTORY: Family History  Problem Relation Age of Onset  . Hypertension Father   . Cerebrovascular Disease Maternal Grandmother   . Heart disease Paternal Grandmother   . Myasthenia gravis Paternal Grandmother   . Hypertension Paternal Grandfather     SOCIAL HISTORY: Social History  Social History  . Marital status: Married    Spouse name: N/A  . Number of children: 0  . Years of education: associates   Occupational History  . [employer]    Social History Main Topics  . Smoking status: Never Smoker  . Smokeless tobacco: Never Used  . Alcohol use No  . Drug use: No  .  Sexual activity: Yes   Other Topics Concern  . Not on file   Social History Narrative   Patient drinks 1-3 cups of caffeine weekly.   Patient is right handed.      PHYSICAL EXAM  Vitals:   06/21/16 0834  BP: (!) 102/54  Pulse: 90  Weight: 114 lb 6.4 oz (51.9 kg)  Height: 5\' 4"  (1.626 m)   Body mass index is 19.64 kg/m.  Generalized: Well developed, in no acute distress   Neurological examination  Mentation: Alert oriented to time, place, history taking. Follows all commands speech and language fluent Cranial nerve II-XII: Pupils were equal round reactive to light. Extraocular movements were full, visual field were full on confrontational test. Facial sensation and strength were normal. Uvula tongue midline. Head turning and shoulder shrug  were normal and symmetric.Ptosis noted in the right eye. With superior gaze for 1 minute ptosis does not worsen. Motor: The motor testing reveals 5 over 5 strength of all 4 extremities. Good symmetric motor tone is noted throughout. With arms abducted for 1 minute she does have mild weakness in the arms bilaterally. Sensory: Sensory testing is intact to soft touch on all 4 extremities. No evidence of extinction is noted.  Coordination: Cerebellar testing reveals good finger-nose-finger and heel-to-shin bilaterally.  Gait and station: Gait is normal. Tandem gait is normal. Romberg is negative. No drift is seen.  Reflexes: Deep tendon reflexes are symmetric and normal bilaterally.   DIAGNOSTIC DATA (LABS, IMAGING, TESTING) - I reviewed patient records, labs, notes, testing and imaging myself where available.  Lab Results  Component Value Date   WBC 8.8 09/01/2015   HGB 14.0 09/01/2015   HCT 42.3 09/01/2015   MCV 89.6 09/01/2015   PLT 263 09/01/2015      Component Value Date/Time   NA 139 09/01/2015 1623   K 3.7 09/01/2015 1623   CL 102 09/01/2015 1623   CO2 23 09/01/2015 1623   GLUCOSE 75 09/01/2015 1623   BUN 14 09/01/2015 1623     CREATININE 0.66 09/01/2015 1623   CALCIUM 9.7 09/01/2015 1623   PROT 7.0 09/01/2015 1623   ALBUMIN 4.7 09/01/2015 1623   AST 22 09/01/2015 1623   ALT 22 09/01/2015 1623   ALKPHOS 46 09/01/2015 1623   BILITOT 0.7 09/01/2015 1623   GFRNONAA >89 09/01/2015 1623   GFRAA >89 09/01/2015 1623    Lab Results  Component Value Date   TSH 0.78 09/01/2015      ASSESSMENT AND PLAN 30 y.o. year old female  has a past medical history of Headache; Myasthenia gravis ([place]); and Myasthenia gravis ([place]). here with:  1. Myasthenia gravis 2. Migraine headaches  Patient advised that if her symptoms related to myasthenia worsen she may need to reconsider starting Mestinon. We did review symptoms of myasthenia as well as myasthenia crisis. Patient verbalized understanding. We will start patient on gabapentin 100 mg at bedtime for 1 week then increasing to 200 mg at bedtime for migraine headaches. If this is not beneficial for her headaches she should let us know. She will follow-up in 3 months or sooner  if needed.     Ward Givens, MSN, NP-C 06/21/2016, 8:41 AM [facility] [address], [place], [place] [address] [phone]

## 2016-06-21 NOTE — Progress Notes (Signed)
I have read the note, and I agree with the clinical assessment and plan.  Jemiah Ellenburg KEITH   

## 2016-06-22 ENCOUNTER — Telehealth: Payer: Self-pay | Admitting: Adult Health

## 2016-06-22 MED ORDER — GABAPENTIN 100 MG PO CAPS: ORAL_CAPSULE | ORAL | 11 refills | Status: DC

## 2016-06-22 NOTE — Telephone Encounter (Signed)
Pt called stating she was seen on 06/21/16 and is waiting on a prescription for headaches. Please call

## 2016-06-22 NOTE — Addendum Note (Signed)
Addended by: Trudie Buckler on: 06/22/2016 05:14 PM   Modules accepted: Orders

## 2016-06-22 NOTE — Telephone Encounter (Signed)
Medication sent.

## 2016-06-22 NOTE — Addendum Note (Signed)
Addended byOliver Hum on: 06/22/2016 04:47 PM   Modules accepted: Orders

## 2016-06-28 NOTE — Telephone Encounter (Signed)
LMVM for pt that escribed prescription last week.  If not received to let us know.

## 2016-07-25 MED FILL — PARoxetine HCL 10 MG TABS: 10 | 90 days supply | Qty: 90 | Fill #0

## 2016-08-02 DIAGNOSIS — Z01419 Encounter for gynecological examination (general) (routine) without abnormal findings: Secondary | ICD-10-CM | POA: Diagnosis not present

## 2016-08-02 DIAGNOSIS — L292 Pruritus vulvae: Secondary | ICD-10-CM | POA: Diagnosis not present

## 2016-08-02 DIAGNOSIS — Z1151 Encounter for screening for human papillomavirus (HPV): Secondary | ICD-10-CM | POA: Diagnosis not present

## 2016-08-02 DIAGNOSIS — Z681 Body mass index (BMI) 19 or less, adult: Secondary | ICD-10-CM | POA: Diagnosis not present

## 2016-08-02 DIAGNOSIS — F53 Puerperal psychosis: Secondary | ICD-10-CM | POA: Diagnosis not present

## 2016-09-25 ENCOUNTER — Encounter: Payer: Self-pay | Admitting: Adult Health

## 2016-09-25 ENCOUNTER — Ambulatory Visit (INDEPENDENT_AMBULATORY_CARE_PROVIDER_SITE_OTHER): Payer: 59 | Admitting: Adult Health

## 2016-09-25 VITALS — BP 91/54 | HR 61 | Ht 64.0 in | Wt 113.6 lb

## 2016-09-25 DIAGNOSIS — G7000 Myasthenia gravis without (acute) exacerbation: Secondary | ICD-10-CM | POA: Diagnosis not present

## 2016-09-25 DIAGNOSIS — G43009 Migraine without aura, not intractable, without status migrainosus: Secondary | ICD-10-CM | POA: Diagnosis not present

## 2016-09-25 NOTE — Progress Notes (Signed)
I have read the note, and I agree with the clinical assessment and plan.  WILLIS,CHARLES KEITH   

## 2016-09-25 NOTE — Patient Instructions (Signed)
We will continue to monitor Continue gabapentin 100 mg at bedtime for migraines Can retry mestinon in the future  Myasthenia Gravis Myasthenia gravis (MG) means severe weakness. It is a long-term (chronic) condition that causes weakness in the muscles you can control (voluntary muscles). MG can affect any voluntary muscle. The muscles most often affected are the ones that control:  Eye movement.  Facial movements.  Swallowing. MG is an autoimmune disease, which means that your body's defense system (immune system) attacks healthy parts of your body instead of germs and other things that make you sick. When you have MG, your immune system makes proteins (antibodies) that block the chemical (acetylcholine) your body needs to send nerve signals to your muscles. This causes muscle weakness. What are the causes? The exact cause of MG is unknown. One possible cause is an enlarged thymus gland, which is located under your breastbone. What are the signs or symptoms? The earliest symptom of MG is muscle weakness that gets worse with activity and gets better after rest. Other symptoms of MG may include:  Drooping eyelids.  Double vision.  Loss of facial expression.  Trouble chewing and swallowing.  Slurred speech.  A waddling walk.  Weakness of the arms, hands, and legs. Trouble breathing is the most dangerous symptom of MG. Sudden and severe difficulty breathing (myasthenic crisis) may require emergency breathing support. This symptom sometimes happens after:  Infection.  Fever.  Drug reaction. How is this diagnosed? It can be hard to diagnose MG because muscle weakness is a common symptom in many conditions. Your health care provider will do a physical exam. You may also have tests that will help make a diagnosis. These may include:  A blood test.  A test using the medicine edrophonium. This medicine increases muscle strength by slowing the breakdown of acetylcholine.  Tests to  measure nerve conduction to muscle (electromyography).  An imaging study of the chest (CT or MRI). How is this treated? Treatment can improve muscle strength. Sometimes symptoms of MG go away for a while (remission) and you can stop treatment. Possible treatments include:  Medicine.  Removal of the thymus gland (thymectomy). This may result in a long remission for some people. Follow these instructions at home:  Take medicines only as directed by your health care provider.  Get plenty of rest to conserve your energy.  Take frequent breaks to rest your eyes.  Maintain a healthy diet and a healthy weight.  Do not use any tobacco products including cigarettes, chewing tobacco, or electronic cigarettes. If you need help quitting, ask your health care provider.  Keep all follow-up visits as directed by your health care provider. This is important. Contact a health care provider if:  Your symptoms get worse after a fever or infection.  You have a reaction to a medicine you are taking.  Your symptoms change or get worse. Get help right away if: You have trouble breathing. This information is not intended to replace advice given to you by your health care provider. Make sure you discuss any questions you have with your health care provider. Document Released: 09/25/2000 Document Revised: 11/25/2015 Document Reviewed: 08/20/2013 Elsevier Interactive Patient Education  2017 [employer].

## 2016-09-25 NOTE — Progress Notes (Signed)
PATIENT: Robin Robertson DOB: [birth year]  REASON FOR VISIT: follow up- myasthenia gravis, migraine headaches HISTORY FROM: patient  HISTORY OF PRESENT ILLNESS: Robin Robertson is a 31 year old female with a history of myasthenia gravis with ocular features and migraine headaches. She returns today for follow-up. Overall she feels that she has remained stable. She states that gabapentin has improved her migraines. She states that she is only taken 100 mg at bedtime. She states occasionally with the weather changes she will have several days of a mild headache but overall she's been doing well. She states her symptoms related to myasthenia has also been stable. She continues to have ptosis on the right. Denies diplopia or blurry vision. Denies any weakness in the limbs. Denies shortness of breath. Denies any trouble chewing or swallowing. She states occasionally if she is up most of the night with her daughter she will feel more fatigued the next day. She is no longer on Mestinon. She stopped this medication prior to getting pregnant. She feels that her symptoms have remained stable. She returns today for an evaluation.  HISTORY 06/21/16: Robin Robertson is a 31 year old female with a history of myasthenia gravis with ocular features. She returns today for follow-up. She reports that she is not on any medication currently. She reports that she was on Mestinon in the past but decided to come off this medication when she got pregnant. She states that her symptoms have remained stable. Her daughter is now 24 years old. She states that she always has ptosis of the right eye. It does get worse with fatigue. Denies diplopia. She states that she does fatigue easily. She states that if she is doing any repetitive activity she does notice weakness and fatigue. Denies any trouble chewing. Denies any trouble breathing. Most recently she's had trouble with migraine headaches. She states that she's had migraines her whole  life. However after she stopped breast-feeding they have gotten worse. She also reports that the barometric pressure is a trigger for her migraines. She states her headaches typically occur on the right side or in the occipital region. She does report photophobia and phonophobia. Denies nausea or vomiting. Denies visual disturbance prior to the headache. Occasionally with severe headache she will have blurry vision. In the past she's been on amitriptyline and Imitrex. She states in the last month's she's had at least 2 headaches a week. She is interested in trying a daily medication to get control of her headaches. She returns today for an evaluation.  HISTORY 06/15/15:  Robin Robertson is a 31 year old right-handed white female with a history of myasthenia gravis with ocular features. The patient has not required any immunosuppressant medications, she has Mestinon to take, but she does not take this on a regular basis. The patient has some intermittent ptosis involving the right eye, she denies any significant problems with double vision, chewing problems, or swallowing problems. She has no weakness of the extremities. She has had the disease for about 4 and half years without progression. She recently delivered a child within the last 3-1/2 weeks. She is doing well following delivery. She comes to this office for an evaluation.  REVIEW OF SYSTEMS: Out of a complete 14 system review of symptoms, the patient complains only of the following symptoms, and all other reviewed systems are negative.  Palpitations, runny nose, fatigue, excessive sweating, bruise easily, headaches, rash  ALLERGIES: Allergies  Allergen Reactions  . Fentanyl Anaphylaxis    Stopped breathing, heart rate dropped to  8bpm  . Influenza Vaccines Shortness Of Breath    Chest pain, Left side of body went numb for 2 weeks  . Vicodin [Hydrocodone-Acetaminophen] Rash    Anger and Black outs    HOME MEDICATIONS: Outpatient Medications  Prior to Visit  Medication Sig Dispense Refill  . gabapentin (NEURONTIN) 100 MG capsule Take 1 capsule at bedtime for one week, then increase to 2 capsules at bedtime. 60 capsule 11   No facility-administered medications prior to visit.     PAST MEDICAL HISTORY: Past Medical History:  Diagnosis Date  . Headache   . Myasthenia gravis ([place])   . Myasthenia gravis ([place])     PAST SURGICAL HISTORY: Past Surgical History:  Procedure Laterality Date  . WISDOM TOOTH EXTRACTION      FAMILY HISTORY: Family History  Problem Relation Age of Onset  . Hypertension Father   . Cerebrovascular Disease Maternal Grandmother   . Heart disease Paternal Grandmother   . Myasthenia gravis Paternal Grandmother   . Hypertension Paternal Grandfather     SOCIAL HISTORY: Social History   Social History  . Marital status: Married    Spouse name: N/A  . Number of children: 0  . Years of education: associates   Occupational History  . [employer]    Social History Main Topics  . Smoking status: Never Smoker  . Smokeless tobacco: Never Used  . Alcohol use No  . Drug use: No  . Sexual activity: Yes   Other Topics Concern  . Not on file   Social History Narrative   Patient drinks 1-3 cups of caffeine weekly.   Patient is right handed.   Has child, CHLOE.      PHYSICAL EXAM  Vitals:   09/25/16 1043  BP: (!) 91/54  Pulse: 61  Weight: 113 lb 9.6 oz (51.5 kg)  Height: 5\' 4"  (1.626 m)   Body mass index is 19.5 kg/m.  Generalized: Well developed, in no acute distress   Neurological examination  Mentation: Alert oriented to time, place, history taking. Follows all commands speech and language fluent Cranial nerve II-XII: Pupils were equal round reactive to light. Extraocular movements were full, visual field were full on confrontational test. Facial sensation and strength were normal. Uvula tongue midline. Head turning and shoulder shrug  were normal and symmetric.Ptosis on the  right.  With eyes held in the superior gaze for 1 minute no worsening of ptosis or diplopia noted. Motor: The motor testing reveals 5 over 5 strength of all 4 extremities. Good symmetric motor tone is noted throughout. With arms abducted for 1 minute no weakness noted. Sensory: Sensory testing is intact to soft touch on all 4 extremities. No evidence of extinction is noted.  Coordination: Cerebellar testing reveals good finger-nose-finger and heel-to-shin bilaterally.  Gait and station: Gait is normal.  Reflexes: Deep tendon reflexes are symmetric and normal bilaterally.   DIAGNOSTIC DATA (LABS, IMAGING, TESTING) - I reviewed patient records, labs, notes, testing and imaging myself where available.  Lab Results  Component Value Date   WBC 8.8 09/01/2015   HGB 14.0 09/01/2015   HCT 42.3 09/01/2015   MCV 89.6 09/01/2015   PLT 263 09/01/2015      Component Value Date/Time   NA 139 09/01/2015 1623   K 3.7 09/01/2015 1623   CL 102 09/01/2015 1623   CO2 23 09/01/2015 1623   GLUCOSE 75 09/01/2015 1623   BUN 14 09/01/2015 1623   CREATININE 0.66 09/01/2015 1623   CALCIUM  9.7 09/01/2015 1623   PROT 7.0 09/01/2015 1623   ALBUMIN 4.7 09/01/2015 1623   AST 22 09/01/2015 1623   ALT 22 09/01/2015 1623   ALKPHOS 46 09/01/2015 1623   BILITOT 0.7 09/01/2015 1623   GFRNONAA >89 09/01/2015 1623   GFRAA >89 09/01/2015 1623    Lab Results  Component Value Date   TSH 0.78 09/01/2015      ASSESSMENT AND PLAN 31 y.o. year old female  has a past medical history of Headache; Myasthenia gravis ([place]); and Myasthenia gravis ([place]). here with:  1. Myasthenia gravis 2. Migraine headaches  Overall the patient has remained stable. She will continue on gabapentin 100 mg at bedtime for her migraines. If her migraine frequency increases we can increase her dose of gabapentin to 200 mg at bedtime. The patient states that they are considering having another child. For that reason she would like to hold  off on the Mestinon. She states her symptoms have been stable. I again reviewed the symptoms of myasthenia gravis as well as myasthenia crisis. She is advised that if she develops symptoms of myasthenia crisis she should go to the emergency room. She voiced understanding. She will follow-up in 6 months with Dr. Jannifer Franklin.    Ward Givens, MSN, NP-C 09/25/2016, 10:58 AM [facility] [address], [place], [place] [address] [phone]

## 2016-10-16 MED FILL — NORETHIN-ESTRAD-FERR 1-0.02: 1-20 | 84 days supply | Qty: 84 | Fill #0

## 2016-10-16 MED FILL — PARoxetine HCL 10 MG TABS: 10 | 90 days supply | Qty: 90 | Fill #0

## 2016-11-01 ENCOUNTER — Ambulatory Visit (INDEPENDENT_AMBULATORY_CARE_PROVIDER_SITE_OTHER): Payer: 59 | Admitting: Osteopathic Medicine

## 2016-11-01 ENCOUNTER — Encounter: Payer: Self-pay | Admitting: Osteopathic Medicine

## 2016-11-01 VITALS — BP 109/65 | HR 98 | Ht 64.0 in | Wt 115.0 lb

## 2016-11-01 DIAGNOSIS — R1011 Right upper quadrant pain: Secondary | ICD-10-CM | POA: Diagnosis not present

## 2016-11-01 DIAGNOSIS — R1031 Right lower quadrant pain: Secondary | ICD-10-CM

## 2016-11-01 DIAGNOSIS — N926 Irregular menstruation, unspecified: Secondary | ICD-10-CM

## 2016-11-01 DIAGNOSIS — N939 Abnormal uterine and vaginal bleeding, unspecified: Secondary | ICD-10-CM | POA: Insufficient documentation

## 2016-11-01 DIAGNOSIS — R7989 Other specified abnormal findings of blood chemistry: Secondary | ICD-10-CM

## 2016-11-01 DIAGNOSIS — R946 Abnormal results of thyroid function studies: Secondary | ICD-10-CM

## 2016-11-01 DIAGNOSIS — N920 Excessive and frequent menstruation with regular cycle: Secondary | ICD-10-CM

## 2016-11-01 LAB — CBC WITH DIFFERENTIAL/PLATELET
Basophils Absolute: 0 cells/uL (ref 0–200)
Basophils Relative: 0 %
Eosinophils Absolute: 0 cells/uL — ABNORMAL LOW (ref 15–500)
Eosinophils Relative: 0 %
HCT: 41.4 % (ref 35.0–45.0)
HEMOGLOBIN: 13.6 g/dL (ref 11.7–15.5)
LYMPHS ABS: 590 {cells}/uL — AB (ref 850–3900)
Lymphocytes Relative: 10 %
MCH: 29.4 pg (ref 27.0–33.0)
MCHC: 32.9 g/dL (ref 32.0–36.0)
MCV: 89.6 fL (ref 80.0–100.0)
MPV: 10.1 fL (ref 7.5–12.5)
Monocytes Absolute: 354 cells/uL (ref 200–950)
Monocytes Relative: 6 %
NEUTROS ABS: 4956 {cells}/uL (ref 1500–7800)
NEUTROS PCT: 84 %
PLATELETS: 202 10*3/uL (ref 140–400)
RBC: 4.62 MIL/uL (ref 3.80–5.10)
RDW: 13.3 % (ref 11.0–15.0)
WBC: 5.9 10*3/uL (ref 3.8–10.8)

## 2016-11-01 LAB — POCT URINALYSIS DIPSTICK
Bilirubin, UA: NEGATIVE
GLUCOSE UA: NEGATIVE
Ketones, UA: 40
Leukocytes, UA: NEGATIVE
NITRITE UA: NEGATIVE
Protein, UA: 30
Spec Grav, UA: 1.025 (ref 1.010–1.025)
UROBILINOGEN UA: 1 U/dL
pH, UA: 6 (ref 5.0–8.0)

## 2016-11-01 LAB — COMPLETE METABOLIC PANEL WITH GFR
ALBUMIN: 4 g/dL (ref 3.6–5.1)
ALK PHOS: 44 U/L (ref 33–115)
ALT: 11 U/L (ref 6–29)
AST: 15 U/L (ref 10–30)
BUN: 14 mg/dL (ref 7–25)
CHLORIDE: 105 mmol/L (ref 98–110)
CO2: 21 mmol/L (ref 20–31)
Calcium: 8.5 mg/dL — ABNORMAL LOW (ref 8.6–10.2)
Creat: 0.65 mg/dL (ref 0.50–1.10)
GFR, Est African American: >89 mL/min (ref >=60)
GFR, Est Non African American: >89 mL/min (ref >=60)
Glucose, Bld: 105 mg/dL — ABNORMAL HIGH (ref 65–99)
POTASSIUM: 3.9 mmol/L (ref 3.5–5.3)
SODIUM: 138 mmol/L (ref 135–146)
Total Bilirubin: 0.6 mg/dL (ref 0.2–1.2)
Total Protein: 6.4 g/dL (ref 6.1–8.1)

## 2016-11-01 LAB — POCT URINE PREGNANCY: PREG TEST UR: NEGATIVE

## 2016-11-01 LAB — TSH: TSH: 0.31 mIU/L — ABNORMAL LOW

## 2016-11-01 MED ORDER — ONDANSETRON 8 MG PO TBDP: 8.0000 mg | ORAL_TABLET | Freq: Three times a day (TID) | ORAL | 1 refills | Status: DC | PRN

## 2016-11-01 NOTE — Progress Notes (Addendum)
HPI: Robin Robertson is a 31 y.o. female  who presents to [place] today, 11/01/16,  for chief complaint of:  Chief Complaint  Patient presents with  . Abdominal Pain  . Nausea    . Location & Quality: cramping pain with nausea but no vomiting upper abdomen on R side, occasional cramping on R lower side . Severity: mild to moderate, able to go to work but is uncomfortable . Duration: 2 weeks going on 3 weeks soon . Assoc signs/symptoms: loose stool few times, vaginal spotting but irregular periods since delivery of baby last year, recent OCP switch last month   Past medical history, surgical history, social history and family history reviewed.  Patient Active Problem List   Diagnosis Date Noted  . Dizziness 09/01/2015  . Postpartum care following vac-assist vaginal delivery (11/17) 05/21/2015  . Normal labor 05/20/2015  . Status post vacuum-assisted vaginal delivery 05/20/2015  . Myasthenia gravis ([place]) 11/24/2014    Current medication list and allergy/intolerance information reviewed.   Current Outpatient Prescriptions on File Prior to Visit  Medication Sig Dispense Refill  . gabapentin (NEURONTIN) 100 MG capsule Take 1 capsule at bedtime for one week, then increase to 2 capsules at bedtime. 60 capsule 11  . norethindrone-ethinyl estradiol (JUNEL FE,GILDESS FE,LOESTRIN FE) 1-20 MG-MCG tablet Take 1 tablet by mouth daily.    Marland Kitchen PARoxetine (PAXIL) 10 MG tablet   0   No current facility-administered medications on file prior to visit.    Allergies  Allergen Reactions  . Fentanyl Anaphylaxis    Stopped breathing, heart rate dropped to 8bpm  . Influenza Vaccines Shortness Of Breath    Chest pain, Left side of body went numb for 2 weeks  . Vicodin [Hydrocodone-Acetaminophen] Rash    Anger and Black outs      Review of Systems:  Constitutional: No fever. Some chills.   Cardiac: No  chest pain, No  pressure, No  palpitations  Respiratory:  No  shortness of breath.  Gastrointestinal: +abdominal pain, +change in bowel habits as per HPI, no blood in stool, +nausea, no vomiting   Musculoskeletal: No new myalgia/arthralgia  Skin: No  Rash  Neurologic: No  weakness, No  Dizziness   Exam:  BP 109/65   Pulse 98   Ht '5\' 4"'  (1.626 m)   Wt 115 lb (52.2 kg)   BMI 19.74 kg/m   Constitutional: VS see above. General Appearance: alert, well-developed, well-nourished, NAD  Eyes: Normal lids and conjunctive, non-icteric sclera  Ears, Nose, Mouth, Throat: MMM, Normal external inspection ears/nares/mouth/lips/gums.  Neck: No masses, trachea midline.   Respiratory: Normal respiratory effort. no wheeze, no rhonchi, no rales  Cardiovascular: S1/S2 normal, no murmur, no rub/gallop auscultated. RRR.   Musculoskeletal: Gait normal. Symmetric and independent movement of all extremities  Abdominal: (+)TTP RUQ, borderline Murphy's - pt is breathing through the exam but very uncomfortable, no rebound, nondistended, BS WNL x4 but bit more active on L side, Neg McBurney's  Neurological: Normal balance/coordination. No tremor.  Skin: warm, dry, intact.   Psychiatric: Normal judgment/insight. Normal mood and affect. Oriented x3.    Recent Results (from the past 2160 hour(s))  POCT urine pregnancy     Status: None   Collection Time: 11/01/16  9:20 AM  Result Value Ref Range   Preg Test, Ur Negative Negative  POCT Urinalysis Dipstick     Status: None   Collection Time: 11/01/16  9:21 AM  Result Value Ref Range   Color,  UA YELLOW    Clarity, UA CLEAR    Glucose, UA NEGATIVE    Bilirubin, UA NEGATIVE    Ketones, UA 40 MG    Spec Grav, UA 1.025 1.010 - 1.025   Blood, UA TRACE    pH, UA 6.0 5.0 - 8.0   Protein, UA 30    Urobilinogen, UA 1.0 0.2 or 1.0 E.U./dL   Nitrite, UA NEGATIVE    Leukocytes, UA Negative Negative  CBC with Differential/Platelet     Status: Abnormal   Collection Time: 11/01/16   9:58 AM  Result Value Ref Range   WBC 5.9 3.8 - 10.8 K/uL   RBC 4.62 3.80 - 5.10 MIL/uL   Hemoglobin 13.6 11.7 - 15.5 g/dL   HCT 41.4 35.0 - 45.0 %   MCV 89.6 80.0 - 100.0 fL   MCH 29.4 27.0 - 33.0 pg   MCHC 32.9 32.0 - 36.0 g/dL   RDW 13.3 11.0 - 15.0 %   Platelets 202 140 - 400 K/uL   MPV 10.1 7.5 - 12.5 fL   Neutro Abs 4,956 1,500 - 7,800 cells/uL   Lymphs Abs 590 (L) 850 - 3,900 cells/uL   Monocytes Absolute 354 200 - 950 cells/uL   Eosinophils Absolute 0 (L) 15 - 500 cells/uL   Basophils Absolute 0 0 - 200 cells/uL   Neutrophils Relative % 84 %   Lymphocytes Relative 10 %   Monocytes Relative 6 %   Eosinophils Relative 0 %   Basophils Relative 0 %   Smear Review Criteria for review not met   COMPLETE METABOLIC PANEL WITH GFR     Status: Abnormal   Collection Time: 11/01/16  9:58 AM  Result Value Ref Range   Sodium 138 135 - 146 mmol/L   Potassium 3.9 3.5 - 5.3 mmol/L   Chloride 105 98 - 110 mmol/L   CO2 21 20 - 31 mmol/L   Glucose, Bld 105 (H) 65 - 99 mg/dL   BUN 14 7 - 25 mg/dL   Creat 0.65 0.50 - 1.10 mg/dL   Total Bilirubin 0.6 0.2 - 1.2 mg/dL   Alkaline Phosphatase 44 33 - 115 U/L   AST 15 10 - 30 U/L   ALT 11 6 - 29 U/L   Total Protein 6.4 6.1 - 8.1 g/dL   Albumin 4.0 3.6 - 5.1 g/dL   Calcium 8.5 (L) 8.6 - 10.2 mg/dL   GFR, Est African American >89 >=60 mL/min   GFR, Est Non African American >89 >=60 mL/min  TSH     Status: Abnormal   Collection Time: 11/01/16  9:58 AM  Result Value Ref Range   TSH 0.31 (L) mIU/L    Comment:   Reference Range   > or = 20 Years  0.40-4.50   Pregnancy Range First trimester  0.26-2.66 Second trimester 0.55-2.73 Third trimester  0.43-2.91     Lipase     Status: None   Collection Time: 11/01/16  9:58 AM  Result Value Ref Range   Lipase 28 7 - 60 U/L       ASSESSMENT/PLAN: Labs/imaging and symptomatic treatment as below, bland/FLD recommended, consider CT, ER precautions reviewed but do not suspect cholecystitis  or appendicitis at this point though s/s of these reviewed w/ patient in case worse   Right upper quadrant abdominal pain - ?GB or pancreatic pathology - labs and [place], consider CT - Plan: POCT Urinalysis Dipstick, POCT urine pregnancy, CBC with Differential/Platelet, COMPLETE METABOLIC PANEL WITH GFR, TSH, Lipase,  US Abdomen Limited RUQ  Abdominal pain, right lower quadrant - ?ovarian pathology - would consider CT, no concern for appendicitis at this point but caution advised  - Plan: Urine Culture, Urine Microscopic, CBC with Differential/Platelet, COMPLETE METABOLIC PANEL WITH GFR, TSH  Vaginal spotting - Likely OCP, following with OBGYN. Not certain if currrent abd symptoms related to GYN issue but might be  Irregular periods - See above re: spotting  Abnormal TSH - Plan: T3, T4, free    Patient Instructions  Plan: 1. Labs today 2. Ultrasound of liver and gallbladder 3. Treat nausea - medications and bland diet 4. Further evaluation will depend on above results and will depend on your symptoms - we may need to get additional testing such as a CT scan of the abdomen & pelvis if your pain persists but the testing is negative  5. If worse, especially severe pain or fever, please seek emergency care, otherwise let's follow-up on your symptoms early next week        Follow-up plan: Return in about 5 days (around 11/06/2016) for recheck sypmtoms, sooner if needed .  Visit summary with medication list and pertinent instructions was printed for patient to review, alert [place] if any changes needed. All questions at time of visit were answered - patient instructed to contact office with any additional concerns. ER/RTC precautions were reviewed with the patient and understanding verbalized.

## 2016-11-01 NOTE — Patient Instructions (Addendum)
Plan: 1. Labs today 2. Ultrasound of liver and gallbladder 3. Treat nausea - medications and bland diet 4. Further evaluation will depend on above results and will depend on your symptoms - we may need to get additional testing such as a CT scan of the abdomen & pelvis if your pain persists but the testing is negative  5. If worse, especially severe pain or fever, please seek emergency care, otherwise let's follow-up on your symptoms early next week

## 2016-11-02 ENCOUNTER — Ambulatory Visit (INDEPENDENT_AMBULATORY_CARE_PROVIDER_SITE_OTHER): Payer: 59

## 2016-11-02 DIAGNOSIS — R1011 Right upper quadrant pain: Secondary | ICD-10-CM

## 2016-11-02 LAB — LIPASE: Lipase: 28 U/L (ref 7–60)

## 2016-11-02 NOTE — Addendum Note (Signed)
Addended by: Maryla Morrow on: 11/02/2016 07:16 AM   Modules accepted: Orders

## 2016-11-03 LAB — T4, FREE: Free T4: 0.9 ng/dL (ref 0.8–1.8)

## 2016-11-03 LAB — T3: T3, Total: 83 ng/dL (ref 76–181)

## 2016-11-06 ENCOUNTER — Ambulatory Visit: Payer: 59 | Admitting: Osteopathic Medicine

## 2016-12-12 ENCOUNTER — Encounter: Payer: Self-pay | Admitting: Adult Health

## 2016-12-13 ENCOUNTER — Other Ambulatory Visit: Payer: Self-pay | Admitting: *Deleted

## 2016-12-13 MED ORDER — GABAPENTIN 100 MG PO CAPS: ORAL_CAPSULE | ORAL | 4 refills | Status: DC

## 2016-12-13 MED FILL — GABAPENTIN 100 MG CAPSULE: 100 | 30 days supply | Qty: 60 | Fill #0

## 2017-01-05 DIAGNOSIS — Z135 Encounter for screening for eye and ear disorders: Secondary | ICD-10-CM | POA: Diagnosis not present

## 2017-01-05 DIAGNOSIS — H33322 Round hole, left eye: Secondary | ICD-10-CM | POA: Diagnosis not present

## 2017-01-08 ENCOUNTER — Encounter (INDEPENDENT_AMBULATORY_CARE_PROVIDER_SITE_OTHER): Payer: 59 | Admitting: Ophthalmology

## 2017-01-08 DIAGNOSIS — H33301 Unspecified retinal break, right eye: Secondary | ICD-10-CM

## 2017-01-08 DIAGNOSIS — H33022 Retinal detachment with multiple breaks, left eye: Secondary | ICD-10-CM

## 2017-01-08 DIAGNOSIS — H35413 Lattice degeneration of retina, bilateral: Secondary | ICD-10-CM

## 2017-01-08 DIAGNOSIS — H43813 Vitreous degeneration, bilateral: Secondary | ICD-10-CM | POA: Diagnosis not present

## 2017-01-17 ENCOUNTER — Encounter (INDEPENDENT_AMBULATORY_CARE_PROVIDER_SITE_OTHER): Payer: 59 | Admitting: Ophthalmology

## 2017-01-17 DIAGNOSIS — H33301 Unspecified retinal break, right eye: Secondary | ICD-10-CM

## 2017-01-25 ENCOUNTER — Encounter (INDEPENDENT_AMBULATORY_CARE_PROVIDER_SITE_OTHER): Payer: 59 | Admitting: Ophthalmology

## 2017-01-25 DIAGNOSIS — H33303 Unspecified retinal break, bilateral: Secondary | ICD-10-CM

## 2017-03-23 ENCOUNTER — Telehealth: Payer: 59 | Admitting: Family

## 2017-03-23 DIAGNOSIS — J028 Acute pharyngitis due to other specified organisms: Secondary | ICD-10-CM | POA: Diagnosis not present

## 2017-03-23 DIAGNOSIS — B9689 Other specified bacterial agents as the cause of diseases classified elsewhere: Secondary | ICD-10-CM

## 2017-03-23 MED ORDER — BENZONATATE 100 MG PO CAPS: 100.0000 mg | ORAL_CAPSULE | Freq: Three times a day (TID) | ORAL | 0 refills | Status: DC | PRN

## 2017-03-23 MED ORDER — AZITHROMYCIN 250 MG PO TABS: ORAL_TABLET | ORAL | 0 refills | Status: DC

## 2017-03-23 MED FILL — AZITHROMYCIN 250 MG TABLET: 250 | 5 days supply | Qty: 6 | Fill #0

## 2017-03-23 MED FILL — BENZONATATE 100 MG CAPS: 100 | 5 days supply | Qty: 30 | Fill #0

## 2017-03-23 NOTE — Progress Notes (Signed)
Thank you for the details you put in the comment boxes. Those details really help [place] take better care of you.   We are sorry that you are not feeling well.  Here is how we plan to help!  Based on your presentation I believe you most likely have A cough due to bacteria.  When patients have a fever and a productive cough with a change in color or increased sputum production, we are concerned about bacterial bronchitis.  If left untreated it can progress to pneumonia.  If your symptoms do not improve with your treatment plan it is important that you contact your provider.   I have prescribed Azithromyin 250 mg: two tables now and then one tablet daily for 4 additonal days    In addition you may use A non-prescription cough medication called Mucinex DM: take 2 tablets every 12 hours. and A prescription cough medication called Tessalon Perles 100mg . You may take 1-2 capsules every 8 hours as needed for your cough.   From your responses in the eVisit questionnaire you describe inflammation in the upper respiratory tract which is causing a significant cough.  This is commonly called Bronchitis and has four common causes:    Allergies  Viral Infections  Acid Reflux  Bacterial Infection Allergies, viruses and acid reflux are treated by controlling symptoms or eliminating the cause. An example might be a cough caused by taking certain blood pressure medications. You stop the cough by changing the medication. Another example might be a cough caused by acid reflux. Controlling the reflux helps control the cough.  USE OF BRONCHODILATOR ("RESCUE") INHALERS: There is a risk from using your bronchodilator too frequently.  The risk is that over-reliance on a medication which only relaxes the muscles surrounding the breathing tubes can reduce the effectiveness of medications prescribed to reduce swelling and congestion of the tubes themselves.  Although you feel brief relief from the bronchodilator inhaler, your  asthma may actually be worsening with the tubes becoming more swollen and filled with mucus.  This can delay other crucial treatments, such as oral steroid medications. If you need to use a bronchodilator inhaler daily, several times per day, you should discuss this with your provider.  There are probably better treatments that could be used to keep your asthma under control.     HOME CARE . Only take medications as instructed by your medical team. . Complete the entire course of an antibiotic. . Drink plenty of fluids and get plenty of rest. . Avoid close contacts especially the very young and the elderly . Cover your mouth if you cough or cough into your sleeve. . Always remember to wash your hands . A steam or ultrasonic humidifier can help congestion.   GET HELP RIGHT AWAY IF: . You develop worsening fever. . You become short of breath . You cough up blood. . Your symptoms persist after you have completed your treatment plan MAKE SURE YOU   Understand these instructions.  Will watch your condition.  Will get help right away if you are not doing well or get worse.  Your e-visit answers were reviewed by a board certified advanced clinical practitioner to complete your personal care plan.  Depending on the condition, your plan could have included both over the counter or prescription medications. If there is a problem please reply  once you have received a response from your provider. Your safety is important to [place].  If you have drug allergies check your prescription carefully.  You can use MyChart to ask questions about today's visit, request a non-urgent call back, or ask for a work or school excuse for 24 hours related to this e-Visit. If it has been greater than 24 hours you will need to follow up with your provider, or enter a new e-Visit to address those concerns. You will get an e-mail in the next two days asking about your experience.  I hope that your e-visit has been  valuable and will speed your recovery. Thank you for using e-visits.

## 2017-04-02 ENCOUNTER — Ambulatory Visit: Payer: 59 | Admitting: Neurology

## 2017-05-23 ENCOUNTER — Encounter (INDEPENDENT_AMBULATORY_CARE_PROVIDER_SITE_OTHER): Payer: 59 | Admitting: Ophthalmology

## 2017-05-23 DIAGNOSIS — H43813 Vitreous degeneration, bilateral: Secondary | ICD-10-CM | POA: Diagnosis not present

## 2017-05-23 DIAGNOSIS — H33303 Unspecified retinal break, bilateral: Secondary | ICD-10-CM

## 2017-06-15 DIAGNOSIS — Z3200 Encounter for pregnancy test, result unknown: Secondary | ICD-10-CM | POA: Diagnosis not present

## 2017-06-21 ENCOUNTER — Telehealth: Payer: 59 | Admitting: Family

## 2017-06-21 ENCOUNTER — Encounter: Payer: Self-pay | Admitting: Adult Health

## 2017-06-21 ENCOUNTER — Ambulatory Visit: Payer: 59 | Admitting: Adult Health

## 2017-06-21 VITALS — BP 98/67 | HR 74 | Ht 64.0 in | Wt 111.8 lb

## 2017-06-21 DIAGNOSIS — J329 Chronic sinusitis, unspecified: Secondary | ICD-10-CM

## 2017-06-21 DIAGNOSIS — R51 Headache: Secondary | ICD-10-CM

## 2017-06-21 DIAGNOSIS — G7000 Myasthenia gravis without (acute) exacerbation: Secondary | ICD-10-CM | POA: Diagnosis not present

## 2017-06-21 DIAGNOSIS — Z3A01 Less than 8 weeks gestation of pregnancy: Secondary | ICD-10-CM | POA: Diagnosis not present

## 2017-06-21 DIAGNOSIS — B9789 Other viral agents as the cause of diseases classified elsewhere: Secondary | ICD-10-CM

## 2017-06-21 DIAGNOSIS — R519 Headache, unspecified: Secondary | ICD-10-CM

## 2017-06-21 MED ORDER — FLUTICASONE PROPIONATE 50 MCG/ACT NA SUSP: 1.0000 | Freq: Two times a day (BID) | NASAL | 6 refills | Status: DC

## 2017-06-21 NOTE — Progress Notes (Signed)
Thank you for the details you included in the comment boxes. Those details are very helpful in determining the best course of treatment for you and help [place] to provide the best care. Due to your pregnancy, there are limited options that are safe. Delsym cough syrup is safe for you to use for your cough (just follow the label). Flonase below is also safe.   We are sorry that you are not feeling well.  Here is how we plan to help!  Based on what you have shared with me it looks like you have sinusitis.  Sinusitis is inflammation and infection in the sinus cavities of the head.  Based on your presentation I believe you most likely have Acute Viral Sinusitis.This is an infection most likely caused by a virus. There is not specific treatment for viral sinusitis other than to help you with the symptoms until the infection runs its course.  You may use an oral decongestant such as Mucinex D or if you have glaucoma or high blood pressure use plain Mucinex. Saline nasal spray help and can safely be used as often as needed for congestion, I have prescribed: Fluticasone nasal spray two sprays in each nostril once a day  Some authorities believe that zinc sprays or the use of Echinacea may shorten the course of your symptoms.  Sinus infections are not as easily transmitted as other respiratory infection, however we still recommend that you avoid close contact with loved ones, especially the very young and elderly.  Remember to wash your hands thoroughly throughout the day as this is the number one way to prevent the spread of infection!  Home Care:  Only take medications as instructed by your medical team.  Complete the entire course of an antibiotic.  Do not take these medications with alcohol.  A steam or ultrasonic humidifier can help congestion.  You can place a towel over your head and breathe in the steam from hot water coming from a faucet.  Avoid close contacts especially the very young and the  elderly.  Cover your mouth when you cough or sneeze.  Always remember to wash your hands.  Get Help Right Away If:  You develop worsening fever or sinus pain.  You develop a severe head ache or visual changes.  Your symptoms persist after you have completed your treatment plan.  Make sure you  Understand these instructions.  Will watch your condition.  Will get help right away if you are not doing well or get worse.  Your e-visit answers were reviewed by a board certified advanced clinical practitioner to complete your personal care plan.  Depending on the condition, your plan could have included both over the counter or prescription medications.  If there is a problem please reply  once you have received a response from your provider.  Your safety is important to [place].  If you have drug allergies check your prescription carefully.    You can use MyChart to ask questions about today's visit, request a non-urgent call back, or ask for a work or school excuse for 24 hours related to this e-Visit. If it has been greater than 24 hours you will need to follow up with your provider, or enter a new e-Visit to address those concerns.  You will get an e-mail in the next two days asking about your experience.  I hope that your e-visit has been valuable and will speed your recovery. Thank you for using e-visits.

## 2017-06-21 NOTE — Progress Notes (Signed)
PATIENT: Robin Robertson DOB: Nov 22, 1985  REASON FOR VISIT: follow up HISTORY FROM: patient  HISTORY OF PRESENT ILLNESS: Today 06/21/17  Robin Robertson is a 31 year old female with a history of myasthenia gravis and migraine headaches.  She returns today for follow-up.  She reports that she recently found that she is [redacted] weeks pregnant.  She has a pending OB/GYN appointment.  The patient is not on any medication for myasthenia gravis.  The patient was on gabapentin over the time of her headaches.  Her she reports that she stopped the medication prior to becoming pregnant.  She states her headaches are under relatively good control.  She reported she had a mild headache before we recently had snow.  She states that she has been having a head cold for the last month.  She states that there are times that she feels weaker she denies worsening ptosis on the right.  Denies diplopia.  Denies any trouble swallowing.  She does report some shortness of breath but feels that this is related to her head cold.  She returns today for an evaluation. HISTORY   REVIEW OF SYSTEMS: Out of a complete 14 system review of symptoms, the patient complains only of the following symptoms, and all other reviewed systems are negative.  ALLERGIES: Allergies  Allergen Reactions  . Fentanyl Anaphylaxis    Stopped breathing, heart rate dropped to 8bpm  . Influenza Vaccines Shortness Of Breath    Chest pain, Left side of body went numb for 2 weeks  . Vicodin [Hydrocodone-Acetaminophen] Rash    Anger and Black outs    HOME MEDICATIONS: Outpatient Medications Prior to Visit  Medication Sig Dispense Refill  . gabapentin (NEURONTIN) 100 MG capsule Take 1 capsule at bedtime for one week, then increase to 2 capsules at bedtime. (Patient taking differently: Take 1 capsule at bedtime for one week, then increase to 2 capsules at bedtime prn) 60 capsule 4  . norethindrone-ethinyl estradiol (JUNEL FE,GILDESS FE,LOESTRIN FE)  1-20 MG-MCG tablet Take 1 tablet by mouth daily.    Marland Kitchen PARoxetine (PAXIL) 10 MG tablet   0  . azithromycin (ZITHROMAX) 250 MG tablet Take 2 tabs now then 1 daily times 4 days 6 tablet 0  . benzonatate (TESSALON PERLES) 100 MG capsule Take 1-2 capsules (100-200 mg total) by mouth every 8 (eight) hours as needed for cough. 30 capsule 0  . ondansetron (ZOFRAN-ODT) 8 MG disintegrating tablet Take 1 tablet (8 mg total) by mouth every 8 (eight) hours as needed for nausea or vomiting. (Patient not taking: Reported on 06/21/2017) 20 tablet 1   No facility-administered medications prior to visit.     PAST MEDICAL HISTORY: Past Medical History:  Diagnosis Date  . Headache   . Myasthenia gravis ([place])   . Myasthenia gravis ([place])     PAST SURGICAL HISTORY: Past Surgical History:  Procedure Laterality Date  . WISDOM TOOTH EXTRACTION      FAMILY HISTORY: Family History  Problem Relation Age of Onset  . Hypertension Father   . Cerebrovascular Disease Maternal Grandmother   . Heart disease Paternal Grandmother   . Myasthenia gravis Paternal Grandmother   . Hypertension Paternal Grandfather     SOCIAL HISTORY: Social History   Socioeconomic History  . Marital status: Married    Spouse name: Not on file  . Number of children: 0  . Years of education: associates  . Highest education level: Not on file  Social Needs  . Financial resource strain: Not  on file  . Food insecurity - worry: Not on file  . Food insecurity - inability: Not on file  . Transportation needs - medical: Not on file  . Transportation needs - non-medical: Not on file  Occupational History  . Occupation: [employer]  Tobacco Use  . Smoking status: Never Smoker  . Smokeless tobacco: Never Used  Substance and Sexual Activity  . Alcohol use: No    Alcohol/week: 0.0 oz  . Drug use: No  . Sexual activity: Yes  Other Topics Concern  . Not on file  Social History Narrative   Patient drinks 1-3 cups of caffeine  weekly.   Patient is right handed.   Has child, CHLOE.      PHYSICAL EXAM  Vitals:   06/21/17 0850  BP: 98/67  Pulse: 74  Weight: 111 lb 12.8 oz (50.7 kg)  Height: 5\' 4"  (1.626 m)   Body mass index is 19.19 kg/m.  Generalized: Well developed, in no acute distress  HEENT: Lungs  clear bilaterally to auscultation  Neurological examination  Mentation: Alert oriented to time, place, history taking. Follows all commands speech and language fluent Cranial nerve II-XII: Pupils were equal round reactive to light. Extraocular movements were full, visual field were full on confrontational test. Facial sensation and strength were normal. Uvula tongue midline. Head turning and shoulder shrug  were normal and symmetric.  Ptosis noted on the right.  With superior gaze for 1 minute no worsening ptosis or diplopia. Motor: The motor testing reveals 5 over 5 strength of all 4 extremities. Good symmetric motor tone is noted throughout.  With arms abducted for 1 minute no fatigable weakness noted.  However giveaway weakness was present. Sensory: Sensory testing is intact to soft touch on all 4 extremities. No evidence of extinction is noted.  Coordination: Cerebellar testing reveals good finger-nose-finger and heel-to-shin bilaterally.  Gait and station: Gait is normal. Tandem gait is normal. Romberg is negative. No drift is seen.  Reflexes: Deep tendon reflexes are symmetric and normal bilaterally.   DIAGNOSTIC DATA (LABS, IMAGING, TESTING) - I reviewed patient records, labs, notes, testing and imaging myself where available.  Lab Results  Component Value Date   WBC 5.9 11/01/2016   HGB 13.6 11/01/2016   HCT 41.4 11/01/2016   MCV 89.6 11/01/2016   PLT 202 11/01/2016      Component Value Date/Time   NA 138 11/01/2016 0958   K 3.9 11/01/2016 0958   CL 105 11/01/2016 0958   CO2 21 11/01/2016 0958   GLUCOSE 105 (H) 11/01/2016 0958   BUN 14 11/01/2016 0958   CREATININE 0.65 11/01/2016 0958     CALCIUM 8.5 (L) 11/01/2016 0958   PROT 6.4 11/01/2016 0958   ALBUMIN 4.0 11/01/2016 0958   AST 15 11/01/2016 0958   ALT 11 11/01/2016 0958   ALKPHOS 44 11/01/2016 0958   BILITOT 0.6 11/01/2016 0958   GFRNONAA >89 11/01/2016 0958   GFRAA >89 11/01/2016 0958   No results found for: CHOL, HDL, LDLCALC, LDLDIRECT, TRIG, CHOLHDL No results found for: HGBA1C No results found for: VITAMINB12 Lab Results  Component Value Date   TSH 0.31 (L) 11/01/2016      ASSESSMENT AND PLAN 31 y.o. year old female  has a past medical history of Headache, Myasthenia gravis ([place]), and Myasthenia gravis ([place]). here with:  1.  Myasthenia gravis 2.  Headache 3.  Pregnancy first trimester  The patient recently found that she is [redacted] weeks pregnant.  We will  not initiate any new medications today.  Her headaches are under relatively good control.  We did review signs and symptoms of myasthenia gravis.  I did advise that if her symptoms worsen she should let us know.  Also advised that if she develops any concerning symptoms such as shortness of breath or difficulty swallowing she should call 911 or go to the emergency room.  Patient voiced understanding.  I did encourage the patient to follow-up with her primary care regarding head cold that has been ongoing for 1 month.  She will follow-up in 3 months or sooner if needed.    Ward Givens, MSN, NP-C 06/21/2017, 9:13 AM [facility] [address], [place], [place] [address] [phone]

## 2017-06-21 NOTE — Patient Instructions (Addendum)
Your Plan:  Continue to monitor symptoms  If you develop weakness, worsening eyelid droop or double vision please let Robin Robertson know If you begin to have shortness of breath call 911 or go to ED If your symptoms worsen or you develop new symptoms please let Robin Robertson know.   Thank you for coming to see Robin Robertson at [facility]. I hope we have been able to provide you high quality care today.  You may receive a patient satisfaction survey over the next few weeks. We would appreciate your feedback and comments so that we may continue to improve ourselves and the health of our patients.  Myasthenia Gravis Myasthenia gravis (MG) means severe weakness. It is a long-term (chronic) condition that causes weakness in the muscles you can control (voluntary muscles). MG can affect any voluntary muscle. The muscles most often affected are the ones that control:  Eye movement.  Facial movements.  Swallowing.  MG is an autoimmune disease, which means that your body's defense system (immune system) attacks healthy parts of your body instead of germs and other things that make you sick. When you have MG, your immune system makes proteins (antibodies) that block the chemical (acetylcholine) your body needs to send nerve signals to your muscles. This causes muscle weakness. What are the causes? The exact cause of MG is unknown. One possible cause is an enlarged thymus gland, which is located under your breastbone. What are the signs or symptoms? The earliest symptom of MG is muscle weakness that gets worse with activity and gets better after rest. Other symptoms of MG may include:  Drooping eyelids.  Double vision.  Loss of facial expression.  Trouble chewing and swallowing.  Slurred speech.  A waddling walk.  Weakness of the arms, hands, and legs.  Trouble breathing is the most dangerous symptom of MG. Sudden and severe difficulty breathing (myasthenic crisis) may require emergency breathing  support. This symptom sometimes happens after:  Infection.  Fever.  Drug reaction.  How is this diagnosed? It can be hard to diagnose MG because muscle weakness is a common symptom in many conditions. Your health care provider will do a physical exam. You may also have tests that will help make a diagnosis. These may include:  A blood test.  A test using the medicine edrophonium. This medicine increases muscle strength by slowing the breakdown of acetylcholine.  Tests to measure nerve conduction to muscle (electromyography).  An imaging study of the chest (CT or MRI).  How is this treated? Treatment can improve muscle strength. Sometimes symptoms of MG go away for a while (remission) and you can stop treatment. Possible treatments include:  Medicine.  Removal of the thymus gland (thymectomy). This may result in a long remission for some people.  Follow these instructions at home:  Take medicines only as directed by your health care provider.  Get plenty of rest to conserve your energy.  Take frequent breaks to rest your eyes.  Maintain a healthy diet and a healthy weight.  Do not use any tobacco products including cigarettes, chewing tobacco, or electronic cigarettes. If you need help quitting, ask your health care provider.  Keep all follow-up visits as directed by your health care provider. This is important. Contact a health care provider if:  Your symptoms get worse after a fever or infection.  You have a reaction to a medicine you are taking.  Your symptoms change or get worse. Get help right away if: You have trouble breathing. This information  is not intended to replace advice given to you by your health care provider. Make sure you discuss any questions you have with your health care provider. Document Released: 09/25/2000 Document Revised: 11/25/2015 Document Reviewed: 08/20/2013 Elsevier Interactive Patient Education  [employer].

## 2017-06-21 NOTE — Progress Notes (Signed)
I have read the note, and I agree with the clinical assessment and plan.  Charles K Willis   

## 2017-07-03 NOTE — L&D Delivery Note (Signed)
Patient: Robin Robertson MRN: [id number]  GBS status: Negative  Patient is a 32 y.o. now G2P2 s/p NSVD at [redacted]w[redacted]d, who was admitted for IOL for myasthenia gravis. AROM 1h 75m prior to delivery with clear fluid.    Delivery Note At 6:17 PM a viable female was delivered via Vaginal, Spontaneous (Presentation: LOA).  APGAR: 9, 9; weight pending.   Placenta status: spontaneous, intact.  Cord:  3 vessel with the following complications: none.    Anesthesia:  Epidural  Episiotomy: None Lacerations: None Suture Repair: N/A Est. Blood Loss (mL):  100    Head delivered LOA. No nuchal cord present. Shoulder and body delivered in usual fashion. Infant with spontaneous cry, placed on mother's abdomen, dried and bulb suctioned. Cord clamped x 2 after 1-minute delay, and cut by family member. Cord blood drawn. Placenta delivered spontaneously with gentle cord traction. Fundus firm with massage and Pitocin. Perineum inspected and found to have no lacerations. Exam notable for bruised, edematous anterior anterior lip of cervix.   Mom to postpartum.  Baby to Couplet care / Skin to Skin.  Melina Schools 02/01/2018, 6:45 PM

## 2017-07-13 ENCOUNTER — Encounter: Payer: Self-pay | Admitting: Certified Nurse Midwife

## 2017-07-13 ENCOUNTER — Ambulatory Visit (INDEPENDENT_AMBULATORY_CARE_PROVIDER_SITE_OTHER): Payer: 59 | Admitting: Certified Nurse Midwife

## 2017-07-13 VITALS — BP 95/56 | HR 72 | Wt 114.0 lb

## 2017-07-13 DIAGNOSIS — Z113 Encounter for screening for infections with a predominantly sexual mode of transmission: Secondary | ICD-10-CM

## 2017-07-13 DIAGNOSIS — Z3482 Encounter for supervision of other normal pregnancy, second trimester: Secondary | ICD-10-CM

## 2017-07-13 DIAGNOSIS — Z3480 Encounter for supervision of other normal pregnancy, unspecified trimester: Secondary | ICD-10-CM | POA: Diagnosis not present

## 2017-07-13 DIAGNOSIS — G7000 Myasthenia gravis without (acute) exacerbation: Secondary | ICD-10-CM

## 2017-07-13 MED ORDER — PRENATAL VITAMIN 27-0.8 MG PO TABS: 1.0000 | ORAL_TABLET | Freq: Every day | ORAL | 11 refills | Status: DC

## 2017-07-13 NOTE — Progress Notes (Signed)
Subjective:  Robin Robertson is a 32 y.o. G2P1001 at [redacted]w[redacted]d being seen today for first prenatal care visit.  She is currently monitored for the following issues for this low-risk pregnancy and has Myasthenia gravis ([place]); Normal labor; Status post vacuum-assisted vaginal delivery; Postpartum care following vac-assist vaginal delivery (11/17); Dizziness; Right upper quadrant abdominal pain; Abdominal pain, right lower quadrant; Vaginal spotting; and Supervision of other normal pregnancy, antepartum on their problem list.  Patient reports nausea.   . Vag. Bleeding: None.   . Denies leaking of fluid.   The following portions of the patient's history were reviewed and updated as appropriate: allergies, current medications, past family history, past medical history, past social history, past surgical history and problem list. Problem list updated.  Objective:   Vitals:   07/13/17 0907  BP: (!) 95/56  Pulse: 72  Weight: 51.7 kg (114 lb)    Fetal Status: Fetal Heart Rate (bpm): 182         General:  Alert, oriented and cooperative. Patient is in no acute distress.  Skin: Skin is warm and dry. No rash noted.   Cardiovascular: Normal heart rate noted  Respiratory: Normal respiratory effort, no problems with respiration noted  Abdomen: Soft, gravid, appropriate for gestational age.       Pelvic: Vag. Bleeding: None Vag D/C Character: Thin   Cervical exam deferred        Extremities: Normal range of motion.     Mental Status: Normal mood and affect. Normal behavior. Normal judgment and thought content.   Urinalysis: Urine Protein: Negative Urine Glucose: Negative  Assessment and Plan:  Pregnancy: G2P1001 at [redacted]w[redacted]d  1. Supervision of other normal pregnancy, antepartum - Babyscripts Schedule Optimization - Obstetric panel - HIV antibody (with reflex) - Culture, OB Urine - Urine cytology ancillary only - [place] MFM Fetal Nuchal Translucency; Future  2. Myasthenia gravis ([place]) - recent eval  by Neuro - not on meds - f/u scheduled for March  3. Nausea of pregnancy - eat often, don't get hungry - B6 50 mg 3-4 times daily - Ginger  Preterm labor symptoms and general obstetric precautions including but not limited to vaginal bleeding, contractions, leaking of fluid and fetal movement were reviewed in detail with the patient. Please refer to After Visit Summary for other counseling recommendations.  No Follow-up on file.   Julianne Handler, CNM

## 2017-07-13 NOTE — Progress Notes (Signed)
Bedside U/S shows single IUP with FHT of 182 BPM and CRL  Is 28.10mm  GA [redacted]w[redacted]d.  Pt will be on BRX opt schedule

## 2017-07-14 LAB — URINE CYTOLOGY ANCILLARY ONLY
CHLAMYDIA, DNA PROBE: NEGATIVE
Neisseria Gonorrhea: NEGATIVE

## 2017-07-15 LAB — CULTURE, OB URINE

## 2017-07-15 LAB — URINE CULTURE, OB REFLEX: ORGANISM ID, BACTERIA: NO GROWTH

## 2017-07-16 ENCOUNTER — Encounter: Payer: Self-pay | Admitting: Certified Nurse Midwife

## 2017-07-16 DIAGNOSIS — O09899 Supervision of other high risk pregnancies, unspecified trimester: Secondary | ICD-10-CM | POA: Insufficient documentation

## 2017-07-16 DIAGNOSIS — Z283 Underimmunization status: Secondary | ICD-10-CM | POA: Insufficient documentation

## 2017-07-16 DIAGNOSIS — Z2839 Other underimmunization status: Secondary | ICD-10-CM | POA: Insufficient documentation

## 2017-07-16 DIAGNOSIS — O9989 Other specified diseases and conditions complicating pregnancy, childbirth and the puerperium: Secondary | ICD-10-CM

## 2017-07-16 LAB — OBSTETRIC PANEL
Antibody Screen: NOT DETECTED
BASOS ABS: 30 {cells}/uL (ref 0–200)
BASOS PCT: 0.4 %
EOS ABS: 68 {cells}/uL (ref 15–500)
Eosinophils Relative: 0.9 %
HCT: 38.4 % (ref 35.0–45.0)
HEP B S AG: NONREACTIVE
Hemoglobin: 13.1 g/dL (ref 11.7–15.5)
Lymphs Abs: 1920 cells/uL (ref 850–3900)
MCH: 30 pg (ref 27.0–33.0)
MCHC: 34.1 g/dL (ref 32.0–36.0)
MCV: 87.9 fL (ref 80.0–100.0)
MPV: 10.7 fL (ref 7.5–12.5)
Monocytes Relative: 6.8 %
Neutro Abs: 4973 cells/uL (ref 1500–7800)
Neutrophils Relative %: 66.3 %
PLATELETS: 251 10*3/uL (ref 140–400)
RBC: 4.37 10*6/uL (ref 3.80–5.10)
RDW: 12.3 % (ref 11.0–15.0)
RPR: NONREACTIVE
TOTAL LYMPHOCYTE: 25.6 %
WBC: 7.5 10*3/uL (ref 3.8–10.8)
WBCMIX: 510 {cells}/uL (ref 200–950)

## 2017-07-16 LAB — HIV ANTIBODY (ROUTINE TESTING W REFLEX): HIV: NONREACTIVE

## 2017-07-18 DIAGNOSIS — O9989 Other specified diseases and conditions complicating pregnancy, childbirth and the puerperium: Principal | ICD-10-CM

## 2017-07-18 DIAGNOSIS — O09899 Supervision of other high risk pregnancies, unspecified trimester: Secondary | ICD-10-CM

## 2017-07-18 DIAGNOSIS — Z3480 Encounter for supervision of other normal pregnancy, unspecified trimester: Secondary | ICD-10-CM

## 2017-07-18 DIAGNOSIS — Z283 Underimmunization status: Secondary | ICD-10-CM

## 2017-07-25 ENCOUNTER — Ambulatory Visit (INDEPENDENT_AMBULATORY_CARE_PROVIDER_SITE_OTHER): Payer: 59 | Admitting: Obstetrics & Gynecology

## 2017-07-25 VITALS — BP 102/56 | HR 75 | Wt 116.0 lb

## 2017-07-25 DIAGNOSIS — G7000 Myasthenia gravis without (acute) exacerbation: Secondary | ICD-10-CM

## 2017-07-25 DIAGNOSIS — Z3480 Encounter for supervision of other normal pregnancy, unspecified trimester: Secondary | ICD-10-CM

## 2017-07-25 DIAGNOSIS — Z3481 Encounter for supervision of other normal pregnancy, first trimester: Secondary | ICD-10-CM

## 2017-07-25 NOTE — Addendum Note (Signed)
Addended by: Phill Myron on: 07/25/2017 11:29 AM   Modules accepted: Orders

## 2017-07-25 NOTE — Progress Notes (Signed)
   PRENATAL VISIT NOTE  Subjective:  Robin Robertson is a 32 y.o. G2P1001 at [redacted]w[redacted]d being seen today for ongoing prenatal care.  She is currently monitored for the following issues for this high-risk pregnancy and has Myasthenia gravis ([place]); Status post vacuum-assisted vaginal delivery; Dizziness; Right upper quadrant abdominal pain; Abdominal pain, right lower quadrant; Vaginal spotting; Supervision of other normal pregnancy, antepartum; and Rubella non-immune status, antepartum on their problem list.  Patient reports nausea.   . Vag. Bleeding: None.   . Denies leaking of fluid.   The following portions of the patient's history were reviewed and updated as appropriate: allergies, current medications, past family history, past medical history, past social history, past surgical history and problem list. Problem list updated.  Objective:   Vitals:   07/25/17 1026  BP: (!) 102/56  Pulse: 75  Weight: 116 lb (52.6 kg)    Fetal Status: Fetal Heart Rate (bpm): 160         General:  Alert, oriented and cooperative. Patient is in no acute distress.  Skin: Skin is warm and dry. No rash noted.   Cardiovascular: Normal heart rate noted  Respiratory: Normal respiratory effort, no problems with respiration noted  Abdomen: Soft, gravid, appropriate for gestational age.  Pain/Pressure: Absent     Pelvic: Cervical exam performed      pap smear today (last pap 2015)  Extremities: Normal range of motion.  Edema: None  Mental Status:  Normal mood and affect. Normal behavior. Normal judgment and thought content.   Assessment and Plan:  Pregnancy: G2P1001 at [redacted]w[redacted]d  1. Supervision of other normal pregnancy, antepartum - First screen scheduled, AFP at 20 weeks - [place] MFM OB DETAIL +14 WK; Future - Babyscripts compliant  2. Myasthenia gravis ([place]) -no new symptoms;  Sees neurologist in April.    Preterm labor symptoms and general obstetric precautions including but not limited to vaginal bleeding,  contractions, leaking of fluid and fetal movement were reviewed in detail with the patient. Please refer to After Visit Summary for other counseling recommendations.   Optmized schedule of [place]; Weekly BP and weight; RTC 20 weeks.  Silas Sacramento, MD

## 2017-07-27 ENCOUNTER — Encounter (HOSPITAL_COMMUNITY): Payer: Self-pay | Admitting: Certified Nurse Midwife

## 2017-07-27 LAB — CYTOLOGY - PAP
Diagnosis: NEGATIVE
HPV: NOT DETECTED

## 2017-08-06 ENCOUNTER — Encounter (HOSPITAL_COMMUNITY): Payer: Self-pay

## 2017-08-06 ENCOUNTER — Ambulatory Visit (HOSPITAL_COMMUNITY)
Admission: RE | Admit: 2017-08-06 | Discharge: 2017-08-06 | Disposition: A | Discharge status: Home / Self Care | Payer: 59 | Source: Ambulatory Visit | Attending: Certified Nurse Midwife | Admitting: Certified Nurse Midwife

## 2017-08-06 DIAGNOSIS — Z3A13 13 weeks gestation of pregnancy: Secondary | ICD-10-CM | POA: Insufficient documentation

## 2017-08-06 DIAGNOSIS — Z3482 Encounter for supervision of other normal pregnancy, second trimester: Secondary | ICD-10-CM | POA: Insufficient documentation

## 2017-08-06 DIAGNOSIS — O281 Abnormal biochemical finding on antenatal screening of mother: Secondary | ICD-10-CM | POA: Diagnosis not present

## 2017-08-06 DIAGNOSIS — Z3480 Encounter for supervision of other normal pregnancy, unspecified trimester: Secondary | ICD-10-CM

## 2017-08-06 DIAGNOSIS — Z3682 Encounter for antenatal screening for nuchal translucency: Secondary | ICD-10-CM | POA: Diagnosis not present

## 2017-08-06 DIAGNOSIS — Z3A00 Weeks of gestation of pregnancy not specified: Secondary | ICD-10-CM | POA: Diagnosis not present

## 2017-08-14 ENCOUNTER — Encounter: Payer: Self-pay | Admitting: *Deleted

## 2017-08-27 DIAGNOSIS — J029 Acute pharyngitis, unspecified: Secondary | ICD-10-CM | POA: Diagnosis not present

## 2017-08-27 DIAGNOSIS — J09X2 Influenza due to identified novel influenza A virus with other respiratory manifestations: Secondary | ICD-10-CM | POA: Diagnosis not present

## 2017-08-28 ENCOUNTER — Telehealth: Payer: Self-pay | Admitting: *Deleted

## 2017-08-28 MED ORDER — PROMETHAZINE HCL 25 MG RE SUPP: 25.0000 mg | Freq: Four times a day (QID) | RECTAL | 0 refills | Status: DC | PRN

## 2017-08-28 NOTE — Telephone Encounter (Signed)
Pt called stating that she went to her PCP yesterday and tested positive for the FLU.  She was given Tamiflu but has not been able to keep them down.  Suggested that she use Phenergan RE Supp.  She has agreed to try these and try clear liquids and popcicles.  If she is still having N and V after 24 hours she will need IV fluids for dehydration.  Pt will touch base with me tomorrow and let me know her status.

## 2017-08-29 ENCOUNTER — Inpatient Hospital Stay (HOSPITAL_COMMUNITY)
Admission: AD | Admit: 2017-08-29 | Discharge: 2017-08-29 | Disposition: A | Discharge status: Home / Self Care | Payer: 59 | Source: Ambulatory Visit | Attending: Obstetrics & Gynecology | Admitting: Obstetrics & Gynecology

## 2017-08-29 ENCOUNTER — Encounter (HOSPITAL_COMMUNITY): Payer: Self-pay

## 2017-08-29 ENCOUNTER — Inpatient Hospital Stay (HOSPITAL_COMMUNITY): Payer: 59

## 2017-08-29 ENCOUNTER — Telehealth: Payer: Self-pay | Admitting: *Deleted

## 2017-08-29 DIAGNOSIS — G7000 Myasthenia gravis without (acute) exacerbation: Secondary | ICD-10-CM | POA: Diagnosis not present

## 2017-08-29 DIAGNOSIS — O98512 Other viral diseases complicating pregnancy, second trimester: Secondary | ICD-10-CM | POA: Diagnosis not present

## 2017-08-29 DIAGNOSIS — R0789 Other chest pain: Secondary | ICD-10-CM | POA: Diagnosis not present

## 2017-08-29 DIAGNOSIS — Z3A16 16 weeks gestation of pregnancy: Secondary | ICD-10-CM | POA: Insufficient documentation

## 2017-08-29 DIAGNOSIS — J101 Influenza due to other identified influenza virus with other respiratory manifestations: Secondary | ICD-10-CM | POA: Diagnosis not present

## 2017-08-29 DIAGNOSIS — J4521 Mild intermittent asthma with (acute) exacerbation: Secondary | ICD-10-CM | POA: Insufficient documentation

## 2017-08-29 DIAGNOSIS — O9989 Other specified diseases and conditions complicating pregnancy, childbirth and the puerperium: Secondary | ICD-10-CM | POA: Diagnosis not present

## 2017-08-29 DIAGNOSIS — J069 Acute upper respiratory infection, unspecified: Secondary | ICD-10-CM | POA: Insufficient documentation

## 2017-08-29 DIAGNOSIS — R0602 Shortness of breath: Secondary | ICD-10-CM | POA: Diagnosis not present

## 2017-08-29 DIAGNOSIS — R05 Cough: Secondary | ICD-10-CM | POA: Diagnosis not present

## 2017-08-29 DIAGNOSIS — O99512 Diseases of the respiratory system complicating pregnancy, second trimester: Secondary | ICD-10-CM | POA: Insufficient documentation

## 2017-08-29 LAB — URINALYSIS, ROUTINE W REFLEX MICROSCOPIC
Bilirubin Urine: NEGATIVE
GLUCOSE, UA: NEGATIVE mg/dL
HGB URINE DIPSTICK: NEGATIVE
KETONES UR: 80 mg/dL — AB
NITRITE: NEGATIVE
Protein, ur: NEGATIVE mg/dL
Specific Gravity, Urine: 1.026 (ref 1.005–1.030)
pH: 5 (ref 5.0–8.0)

## 2017-08-29 MED ORDER — IPRATROPIUM-ALBUTEROL 0.5-2.5 (3) MG/3ML IN SOLN
3.0000 mL | Freq: Four times a day (QID) | RESPIRATORY_TRACT | Status: DC
  Administered 2017-08-29: 3 mL via RESPIRATORY_TRACT
  Filled 2017-08-29: qty 3

## 2017-08-29 MED ORDER — ALBUTEROL SULFATE HFA 108 (90 BASE) MCG/ACT IN AERS: 2.0000 | INHALATION_SPRAY | Freq: Four times a day (QID) | RESPIRATORY_TRACT | 0 refills | Status: DC | PRN

## 2017-08-29 MED ORDER — ACETAMINOPHEN 325 MG PO TABS
650.0000 mg | ORAL_TABLET | Freq: Once | ORAL | Status: AC
  Administered 2017-08-29: 650 mg via ORAL
  Filled 2017-08-29: qty 2

## 2017-08-29 MED ORDER — PROMETHAZINE HCL 25 MG/ML IJ SOLN
12.5000 mg | Freq: Four times a day (QID) | INTRAMUSCULAR | Status: DC | PRN
Start: 2017-08-29 — End: 2017-08-29
  Administered 2017-08-29: 12.5 mg via INTRAVENOUS
  Filled 2017-08-29: qty 1

## 2017-08-29 MED ORDER — DEXTROSE 5 % IN LACTATED RINGERS IV BOLUS
1000.0000 mL | Freq: Once | INTRAVENOUS | Status: AC
  Administered 2017-08-29: 1000 mL via INTRAVENOUS

## 2017-08-29 MED ORDER — LACTATED RINGERS IV BOLUS (SEPSIS)
1000.0000 mL | Freq: Once | INTRAVENOUS | Status: AC
  Administered 2017-08-29: 1000 mL via INTRAVENOUS

## 2017-08-29 MED ORDER — ONDANSETRON 4 MG PO TBDP: 4.0000 mg | ORAL_TABLET | Freq: Four times a day (QID) | ORAL | 0 refills | Status: DC | PRN

## 2017-08-29 NOTE — MAU Note (Signed)
Pt reports positive influenza A on Monday, states she began having vomiting Monday pm and yesterday.

## 2017-08-29 NOTE — Telephone Encounter (Signed)
Pt called stating that her nausea is better on the Phenergan but now she can barely talk and coughing.  She states that she is running a temp.  "I just feel aweful"  She went to [facility] and they would not see her because she is pregnant and did not feel comfortable treating her.  We do not have a provider this afternoon or on Thursday.  I feel that this patient needs to be seen today.  I spoke with Dr Elly Modena @ [facility] location and she feels the patient should go to the MAU.  I advised the patient of this and she is worried about her high deductible but says that she will go on over.  I told her we could write a letter of appeal is the patient had any problems with her insurance.

## 2017-08-29 NOTE — MAU Provider Note (Signed)
Chief Complaint: Influenza   None     SUBJECTIVE HPI: Robin Robertson is a 32 y.o. G2P1001 at [redacted]w[redacted]d by LMP who presents to maternity admissions sent from [place] office known flu and worsening cough and onset of chest tightness today. Fever has persisted daily, resolved with Tylenol, but returns after 4 hours.  She initially started having congestion, cough, body aches and fever 2 days ago and called the OB office.  She was directed to get tested for flu and went to [facility] and had negative strep throat swab but positive flu swab on 08/27/17. She was prescribed Tamiflu, which she started right away. Then, on 2/26, she started having n/v and could not keep anything down, including Tamiflu.  She called the OB office and was prescribed Phenergan suppositories, which resolved her n/v. Last emesis this morning and only 2 episodes of emesis in 24 hours.  She reports her fever has persisted and her chest congestion is worsening with new onset chest tightness. She denies shortness of breath but reports she is coughing so much it is hard to breath sometimes.  She is taking Tylenol, Phenergan, and Tamiflu.  She is concerned about the pregnancy with her ongoing fever.  There are no other associated symptoms.  She denies abdominal pain, vaginal bleeding, LOF.      HPI  Past Medical History:  Diagnosis Date  . Headache   . Myasthenia gravis ([place])   . Myasthenia gravis ([place])   . Post partum depression    Past Surgical History:  Procedure Laterality Date  . WISDOM TOOTH EXTRACTION     Social History   Socioeconomic History  . Marital status: Married    Spouse name: Not on file  . Number of children: 0  . Years of education: associates  . Highest education level: Not on file  Social Needs  . Financial resource strain: Not on file  . Food insecurity - worry: Not on file  . Food insecurity - inability: Not on file  . Transportation needs - medical: Not on file  . Transportation needs -  non-medical: Not on file  Occupational History  . Occupation: [employer]  Tobacco Use  . Smoking status: Never Smoker  . Smokeless tobacco: Never Used  Substance and Sexual Activity  . Alcohol use: No    Alcohol/week: 0.0 oz  . Drug use: No  . Sexual activity: Yes    Partners: Male  Other Topics Concern  . Not on file  Social History Narrative   Patient drinks 1-3 cups of caffeine weekly.   Patient is right handed.   Has child, CHLOE.   No current facility-administered medications on file prior to encounter.    Current Outpatient Medications on File Prior to Encounter  Medication Sig Dispense Refill  . Prenatal Multivit-Min-Fe-FA (PRENATAL VITAMINS PO) Take by mouth.    . Prenatal Vit-Fe Fumarate-FA (PRENATAL VITAMIN) 27-0.8 MG TABS Take 1 tablet by mouth daily. 30 tablet 11  . promethazine (PHENERGAN) 25 MG suppository Place 1 suppository (25 mg total) rectally every 6 (six) hours as needed for nausea or vomiting. 12 each 0   Allergies  Allergen Reactions  . Fentanyl Anaphylaxis    Stopped breathing, heart rate dropped to 8bpm  . Influenza Vaccines Shortness Of Breath    Chest pain, Left side of body went numb for 2 weeks  . Vicodin [Hydrocodone-Acetaminophen] Rash    Anger and Black outs    ROS:  Review of Systems  Constitutional: Positive for appetite change, chills and fever. Negative for fatigue.  HENT: Positive for congestion, rhinorrhea and sore throat.   Respiratory: Negative for shortness of breath.   Cardiovascular: Negative for chest pain.  Gastrointestinal: Positive for nausea and vomiting. Negative for constipation and diarrhea.  Genitourinary: Negative for difficulty urinating, dysuria, flank pain, pelvic pain, vaginal bleeding, vaginal discharge and vaginal pain.  Neurological: Negative for dizziness and headaches.  Psychiatric/Behavioral: Negative.      I have reviewed patient's Past Medical Hx, Surgical Hx, Family Hx, Social Hx, medications and  allergies.   Physical Exam   Patient Vitals for the past 24 hrs:  BP Temp Temp src Pulse Resp SpO2 Height Weight  08/29/17 1938 (!) 105/54 - - 97 - - - -  08/29/17 1722 - - - - - 100 % - -  08/29/17 1448 (!) 101/59 98.4 F (36.9 C) Oral (!) 101 16 100 % 5\' 4"  (1.626 m) 115 lb (52.2 kg)   Constitutional: Well-developed, well-nourished female in no acute distress.  HEART: normal rate, heart sounds, regular rhythm RESP: normal effort, lung sounds clear and equal bilaterally GI: Abd soft, non-tender. Pos BS x 4 MS: Extremities nontender, no edema, normal ROM Neurologic: Alert and oriented x 4.  GU: Neg CVAT.   FHT 172 by doppler  LAB RESULTS Results for orders placed or performed during the hospital encounter of 08/29/17 (from the past 24 hour(s))  Urinalysis, Routine w reflex microscopic     Status: Abnormal   Collection Time: 08/29/17  2:50 PM  Result Value Ref Range   Color, Urine YELLOW YELLOW   APPearance HAZY (A) CLEAR   Specific Gravity, Urine 1.026 1.005 - 1.030   pH 5.0 5.0 - 8.0   Glucose, UA NEGATIVE NEGATIVE mg/dL   Hgb urine dipstick NEGATIVE NEGATIVE   Bilirubin Urine NEGATIVE NEGATIVE   Ketones, ur 80 (A) NEGATIVE mg/dL   Protein, ur NEGATIVE NEGATIVE mg/dL   Nitrite NEGATIVE NEGATIVE   Leukocytes, UA MODERATE (A) NEGATIVE   RBC / HPF 0-5 0 - 5 RBC/hpf   WBC, UA 0-5 0 - 5 WBC/hpf   Bacteria, UA MANY (A) NONE SEEN   Squamous Epithelial / LPF 0-5 (A) NONE SEEN   Mucus PRESENT     A/RH(D) POSITIVE/-- (01/11 0931)  IMAGING   MAU Management/MDM: Pt with chest tightness, worsening URI symptoms and known influenza.  Lung sounds clear but with significant coughing during exam.  Breathing treatment from respiratory therapy and chest X-ray ordered. UA with dehydration so D5LR x 1000 ml and LR x 1000 ml given in MAU. Chest X-ray with no evidence of pneumonia, improvement of chest tightness after duoneb treatment from respiratory D/C home with Rx for albuterol  inhaler, Zofran ODT Continue to manage nausea with Phenergan and Zofran, increase PO fluids Continue Tamiflu.  List of safe OTC medications in pregnancy given, consider Mucinex for chest congestion.   If chest tightness, pain, or SOB worsen, seek care at Urgent Care or ED, return to MAU for Ob/Gyn emergencies Keep scheduled appts at [place] office Pt discharged with strict return precautions.  ASSESSMENT 1. Influenza A   2. Acute upper respiratory infection   3. Feeling of chest tightness   4. Pregnancy with 16 completed weeks gestation   5. Mild intermittent asthma with exacerbation     PLAN Discharge home Allergies as of 08/29/2017      Reactions   Fentanyl Anaphylaxis   Stopped breathing, heart rate dropped to 8bpm  Influenza Vaccines Shortness Of Breath   Chest pain, Left side of body went numb for 2 weeks   Vicodin [hydrocodone-acetaminophen] Rash   Anger and Black outs      Medication List    TAKE these medications   albuterol 108 (90 Base) MCG/ACT inhaler Commonly known as:  PROVENTIL HFA;VENTOLIN HFA Inhale 2 puffs into the lungs every 6 (six) hours as needed for wheezing or shortness of breath.   ondansetron 4 MG disintegrating tablet Commonly known as:  ZOFRAN ODT Take 1 tablet (4 mg total) by mouth every 6 (six) hours as needed for nausea.   Prenatal Vitamin 27-0.8 MG Tabs Take 1 tablet by mouth daily.   PRENATAL VITAMINS PO Take by mouth.   promethazine 25 MG suppository Commonly known as:  PHENERGAN Place 1 suppository (25 mg total) rectally every 6 (six) hours as needed for nausea or vomiting.      Follow-up [place] for [facility] [place] Follow up.   Specialty:  Obstetrics and Gynecology Why:  As scheduled, return to MAU as needed for emergencies. Contact information: [place], [place] Certified Nurse-Midwife 08/29/2017   9:13 PM

## 2017-09-14 ENCOUNTER — Other Ambulatory Visit (HOSPITAL_COMMUNITY): Payer: Self-pay | Admitting: *Deleted

## 2017-09-14 ENCOUNTER — Ambulatory Visit (HOSPITAL_COMMUNITY)
Admission: RE | Admit: 2017-09-14 | Discharge: 2017-09-14 | Disposition: A | Discharge status: Home / Self Care | Payer: 59 | Source: Ambulatory Visit | Attending: Obstetrics & Gynecology | Admitting: Obstetrics & Gynecology

## 2017-09-14 ENCOUNTER — Encounter (HOSPITAL_COMMUNITY): Payer: Self-pay

## 2017-09-14 ENCOUNTER — Other Ambulatory Visit: Payer: Self-pay | Admitting: Obstetrics & Gynecology

## 2017-09-14 DIAGNOSIS — O99350 Diseases of the nervous system complicating pregnancy, unspecified trimester: Secondary | ICD-10-CM

## 2017-09-14 DIAGNOSIS — Z3480 Encounter for supervision of other normal pregnancy, unspecified trimester: Secondary | ICD-10-CM

## 2017-09-14 DIAGNOSIS — Z363 Encounter for antenatal screening for malformations: Secondary | ICD-10-CM

## 2017-09-14 DIAGNOSIS — Z3A19 19 weeks gestation of pregnancy: Secondary | ICD-10-CM

## 2017-09-14 DIAGNOSIS — O99352 Diseases of the nervous system complicating pregnancy, second trimester: Secondary | ICD-10-CM | POA: Diagnosis not present

## 2017-09-14 DIAGNOSIS — G7000 Myasthenia gravis without (acute) exacerbation: Secondary | ICD-10-CM

## 2017-09-17 ENCOUNTER — Other Ambulatory Visit (HOSPITAL_COMMUNITY): Payer: Self-pay | Admitting: *Deleted

## 2017-09-17 DIAGNOSIS — O26872 Cervical shortening, second trimester: Secondary | ICD-10-CM

## 2017-09-17 DIAGNOSIS — Z362 Encounter for other antenatal screening follow-up: Secondary | ICD-10-CM

## 2017-09-20 ENCOUNTER — Other Ambulatory Visit: Payer: Self-pay | Admitting: *Deleted

## 2017-09-20 MED ORDER — ALBUTEROL SULFATE HFA 108 (90 BASE) MCG/ACT IN AERS: 2.0000 | INHALATION_SPRAY | Freq: Four times a day (QID) | RESPIRATORY_TRACT | 0 refills | Status: DC | PRN

## 2017-09-21 ENCOUNTER — Encounter: Payer: 59 | Admitting: Advanced Practice Midwife

## 2017-09-26 ENCOUNTER — Ambulatory Visit (INDEPENDENT_AMBULATORY_CARE_PROVIDER_SITE_OTHER): Payer: 59 | Admitting: Obstetrics & Gynecology

## 2017-09-26 VITALS — BP 95/89 | HR 87 | Wt 118.0 lb

## 2017-09-26 DIAGNOSIS — Z3402 Encounter for supervision of normal first pregnancy, second trimester: Secondary | ICD-10-CM

## 2017-09-26 DIAGNOSIS — Z3400 Encounter for supervision of normal first pregnancy, unspecified trimester: Secondary | ICD-10-CM

## 2017-09-26 DIAGNOSIS — Z3480 Encounter for supervision of other normal pregnancy, unspecified trimester: Secondary | ICD-10-CM

## 2017-09-26 NOTE — Progress Notes (Signed)
AFP drawn    PRENATAL VISIT NOTE  Subjective:  Kemyra August Oats is a 32 y.o. G2P1001 at [redacted]w[redacted]d being seen today for ongoing prenatal care.  She is currently monitored for the following issues for this high-risk pregnancy and has Myasthenia gravis ([place]); Status post vacuum-assisted vaginal delivery; Dizziness; Right upper quadrant abdominal pain; Abdominal pain, right lower quadrant; Supervision of other normal pregnancy, antepartum; and Rubella non-immune status, antepartum on their problem list.  Patient reports no complaints.   . Vag. Bleeding: None.  Movement: Present. Denies leaking of fluid.   The following portions of the patient's history were reviewed and updated as appropriate: allergies, current medications, past family history, past medical history, past social history, past surgical history and problem list. Problem list updated.  Objective:   Vitals:   09/26/17 0956  BP: 95/89  Pulse: 87  Weight: 118 lb (53.5 kg)    Fetal Status: Fetal Heart Rate (bpm): 156   Movement: Present     General:  Alert, oriented and cooperative. Patient is in no acute distress.  Skin: Skin is warm and dry. No rash noted.   Cardiovascular: Normal heart rate noted  Respiratory: Normal respiratory effort, no problems with respiration noted  Abdomen: Soft, gravid, appropriate for gestational age.  Pain/Pressure: Present     Pelvic: Cervical exam performed      closed with tone; posterior, at least 2 cm but can't tell full length since closed.    Extremities: Normal range of motion.  Edema: None  Mental Status:  Normal mood and affect. Normal behavior. Normal judgment and thought content.   Assessment and Plan:  Pregnancy: G2P1001 at [redacted]w[redacted]d  1. Supervision of normal first pregnancy, antepartum -First screen negative - Alpha fetoprotein, maternal -Pt opts to not repeat anatomy at this time.  Pt has high deductible plan and understands there is a very small chance there is a problem with spine  or heart.  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  If AFP is abnml, then will rpt [place] to get better look at spine. Pt on Babyscripts and compliant (except for one week)  2. ?short cervix on [place] -unclear if [place] tech issue (see report).  TVUS was not done at time of anatomy.  Pt was not aware at visit the there concerns of cervix (MFM came to room to talk about spine only)  Cervix closed today with tone.  PT will get F/U TVUS for cervical length.    Preterm labor symptoms and general obstetric precautions including but not limited to vaginal bleeding, contractions, leaking of fluid and fetal movement were reviewed in detail with the patient. Please refer to After Visit Summary for other counseling recommendations.  Return in about 7 weeks (around 11/14/2017).   Silas Sacramento, MD

## 2017-09-27 LAB — ALPHA FETOPROTEIN, MATERNAL
AFP MoM: 1
AFP, SERUM: 73 ng/mL
CALC'D GESTATIONAL AGE: 20.7 wk
MATERNAL WT: 118 [lb_av]
Risk for ONTD: 1
TWINS-AFP: 1

## 2017-10-02 ENCOUNTER — Encounter: Payer: Self-pay | Admitting: *Deleted

## 2017-10-05 ENCOUNTER — Ambulatory Visit (HOSPITAL_COMMUNITY)
Admission: RE | Admit: 2017-10-05 | Discharge: 2017-10-05 | Disposition: A | Discharge status: Home / Self Care | Payer: 59 | Source: Ambulatory Visit | Attending: Obstetrics & Gynecology | Admitting: Obstetrics & Gynecology

## 2017-10-05 ENCOUNTER — Encounter (HOSPITAL_COMMUNITY): Payer: Self-pay

## 2017-10-05 ENCOUNTER — Other Ambulatory Visit (HOSPITAL_COMMUNITY): Payer: Self-pay | Admitting: Obstetrics and Gynecology

## 2017-10-05 DIAGNOSIS — Z3A22 22 weeks gestation of pregnancy: Secondary | ICD-10-CM | POA: Insufficient documentation

## 2017-10-05 DIAGNOSIS — Z3686 Encounter for antenatal screening for cervical length: Secondary | ICD-10-CM

## 2017-10-05 DIAGNOSIS — Z363 Encounter for antenatal screening for malformations: Secondary | ICD-10-CM

## 2017-10-05 DIAGNOSIS — O2692 Pregnancy related conditions, unspecified, second trimester: Secondary | ICD-10-CM

## 2017-10-05 DIAGNOSIS — Z283 Underimmunization status: Secondary | ICD-10-CM

## 2017-10-05 DIAGNOSIS — O09899 Supervision of other high risk pregnancies, unspecified trimester: Secondary | ICD-10-CM

## 2017-10-05 DIAGNOSIS — O9989 Other specified diseases and conditions complicating pregnancy, childbirth and the puerperium: Secondary | ICD-10-CM

## 2017-10-05 DIAGNOSIS — Z3480 Encounter for supervision of other normal pregnancy, unspecified trimester: Secondary | ICD-10-CM

## 2017-10-11 ENCOUNTER — Telehealth: Payer: Self-pay | Admitting: Obstetrics & Gynecology

## 2017-10-11 NOTE — Telephone Encounter (Signed)
Pt fax [phone]. Please fax to pt medical claim certificate for pt reimbursement per her request.

## 2017-10-17 ENCOUNTER — Ambulatory Visit: Payer: 59 | Admitting: Adult Health

## 2017-11-16 ENCOUNTER — Ambulatory Visit (INDEPENDENT_AMBULATORY_CARE_PROVIDER_SITE_OTHER): Payer: 59 | Admitting: Advanced Practice Midwife

## 2017-11-16 VITALS — BP 95/59 | HR 79 | Temp 97.3°F | Wt 125.0 lb

## 2017-11-16 DIAGNOSIS — Z3480 Encounter for supervision of other normal pregnancy, unspecified trimester: Secondary | ICD-10-CM | POA: Diagnosis not present

## 2017-11-16 DIAGNOSIS — Z3482 Encounter for supervision of other normal pregnancy, second trimester: Secondary | ICD-10-CM

## 2017-11-16 DIAGNOSIS — Z23 Encounter for immunization: Secondary | ICD-10-CM | POA: Diagnosis not present

## 2017-11-16 DIAGNOSIS — G7000 Myasthenia gravis without (acute) exacerbation: Secondary | ICD-10-CM

## 2017-11-16 NOTE — Patient Instructions (Addendum)
TDaP Vaccine Pregnancy Get the Whooping Cough Vaccine While You Are Pregnant (CDC)  It is important for women to get the whooping cough vaccine in the third trimester of each pregnancy. Vaccines are the best way to prevent this disease. There are 2 different whooping cough vaccines. Both vaccines combine protection against whooping cough, tetanus and diphtheria, but they are for different age groups: Tdap: for everyone 11 years or older, including pregnant women  DTaP: for children 2 months through 6 years of age  You need the whooping cough vaccine during each of your pregnancies The recommended time to get the shot is during your 27th through 36th week of pregnancy, preferably during the earlier part of this time period. The Centers for Disease Control and Prevention (CDC) recommends that pregnant women receive the whooping cough vaccine for adolescents and adults (called Tdap vaccine) during the third trimester of each pregnancy. The recommended time to get the shot is during your 27th through 36th week of pregnancy, preferably during the earlier part of this time period. This replaces the original recommendation that pregnant women get the vaccine only if they had not previously received it. The American College of Obstetricians and Gynecologists and the American College of Nurse-Midwives support this recommendation.  You should get the whooping cough vaccine while pregnant to pass protection to your baby frame support disabled and/or not supported in this browser  Learn why Robin Robertson decided to get the whooping cough vaccine in her 3rd trimester of pregnancy and how her baby girl was born with some protection against the disease. Also available on YouTube. After receiving the whooping cough vaccine, your body will create protective antibodies (proteins produced by the body to fight off diseases) and pass some of them to your baby before birth. These antibodies provide your baby some short-term  protection against whooping cough in early life. These antibodies can also protect your baby from some of the more serious complications that come along with whooping cough. Your protective antibodies are at their highest about 2 weeks after getting the vaccine, but it takes time to pass them to your baby. So the preferred time to get the whooping cough vaccine is early in your third trimester. The amount of whooping cough antibodies in your body decreases over time. That is why CDC recommends you get a whooping cough vaccine during each pregnancy. Doing so allows each of your babies to get the greatest number of protective antibodies from you. This means each of your babies will get the best protection possible against this disease.  Getting the whooping cough vaccine while pregnant is better than getting the vaccine after you give birth Whooping cough vaccination during pregnancy is ideal so your baby will have short-term protection as soon as he is born. This early protection is important because your baby will not start getting his whooping cough vaccines until he is 2 months old. These first few months of life are when your baby is at greatest risk for catching whooping cough. This is also when he's at greatest risk for having severe, potentially life-threating complications from the infection. To avoid that gap in protection, it is best to get a whooping cough vaccine during pregnancy. You will then pass protection to your baby before he is born. To continue protecting your baby, he should get whooping cough vaccines starting at 2 months old. You may never have gotten the Tdap vaccine before and did not get it during this pregnancy. If so, you should make sure   to get the vaccine immediately after you give birth, before leaving the hospital or birthing center. It will take about 2 weeks before your body develops protection (antibodies) in response to the vaccine. Once you have protection from the vaccine,  you are less likely to give whooping cough to your newborn while caring for him. But remember, your baby will still be at risk for catching whooping cough from others. A recent study looked to see how effective Tdap was at preventing whooping cough in babies whose mothers got the vaccine while pregnant or in the hospital after giving birth. The study found that getting Tdap between 27 through 36 weeks of pregnancy is 85% more effective at preventing whooping cough in babies younger than 2 months old. Blood tests cannot tell if you need a whooping cough vaccine There are no blood tests that can tell you if you have enough antibodies in your body to protect yourself or your baby against whooping cough. Even if you have been sick with whooping cough in the past or previously received the vaccine, you still should get the vaccine during each pregnancy. Breastfeeding may pass some protective antibodies onto your baby By breastfeeding, you may pass some antibodies you have made in response to the vaccine to your baby. When you get a whooping cough vaccine during your pregnancy, you will have antibodies in your breast milk that you can share with your baby as soon as your milk comes in. However, your baby will not get protective antibodies immediately if you wait to get the whooping cough vaccine until after delivering your baby. This is because it takes about 2 weeks for your body to create antibodies. Learn more about the health benefits of breastfeeding.   Contraception Choices Contraception, also called birth control, refers to methods or devices that prevent pregnancy. Hormonal methods Contraceptive implant A contraceptive implant is a thin, plastic tube that contains a hormone. It is inserted into the upper part of the arm. It can remain in place for up to 3 years. Progestin-only injections Progestin-only injections are injections of progestin, a synthetic form of the hormone progesterone. They are  given every 3 months by a health care provider. Birth control pills Birth control pills are pills that contain hormones that prevent pregnancy. They must be taken once a day, preferably at the same time each day. Birth control patch The birth control patch contains hormones that prevent pregnancy. It is placed on the skin and must be changed once a week for three weeks and removed on the fourth week. A prescription is needed to use this method of contraception. Vaginal ring A vaginal ring contains hormones that prevent pregnancy. It is placed in the vagina for three weeks and removed on the fourth week. After that, the process is repeated with a new ring. A prescription is needed to use this method of contraception. Emergency contraceptive Emergency contraceptives prevent pregnancy after unprotected sex. They come in pill form and can be taken up to 5 days after sex. They work best the sooner they are taken after having sex. Most emergency contraceptives are available without a prescription. This method should not be used as your only form of birth control. Barrier methods Female condom A female condom is a thin sheath that is worn over the penis during sex. Condoms keep sperm from going inside a woman's body. They can be used with a spermicide to increase their effectiveness. They should be disposed after a single use. Female condom A female  condom is a soft, loose-fitting sheath that is put into the vagina before sex. The condom keeps sperm from going inside a woman's body. They should be disposed after a single use. Diaphragm A diaphragm is a soft, dome-shaped barrier. It is inserted into the vagina before sex, along with a spermicide. The diaphragm blocks sperm from entering the uterus, and the spermicide kills sperm. A diaphragm should be left in the vagina for 6-8 hours after sex and removed within 24 hours. A diaphragm is prescribed and fitted by a health care provider. A diaphragm should be  replaced every 1-2 years, after giving birth, after gaining more than 15 lb (6.8 kg), and after pelvic surgery. Cervical cap A cervical cap is a round, soft latex or plastic cup that fits over the cervix. It is inserted into the vagina before sex, along with spermicide. It blocks sperm from entering the uterus. The cap should be left in place for 6-8 hours after sex and removed within 48 hours. A cervical cap must be prescribed and fitted by a health care provider. It should be replaced every 2 years. Sponge A sponge is a soft, circular piece of polyurethane foam with spermicide on it. The sponge helps block sperm from entering the uterus, and the spermicide kills sperm. To use it, you make it wet and then insert it into the vagina. It should be inserted before sex, left in for at least 6 hours after sex, and removed and thrown away within 30 hours. Spermicides Spermicides are chemicals that kill or block sperm from entering the cervix and uterus. They can come as a cream, jelly, suppository, foam, or tablet. A spermicide should be inserted into the vagina with an applicator at least 40-10 minutes before sex to allow time for it to work. The process must be repeated every time you have sex. Spermicides do not require a prescription. Intrauterine contraception Intrauterine device (IUD) An IUD is a T-shaped device that is put in a woman's uterus. There are two types:  Hormone IUD.This type contains progestin, a synthetic form of the hormone progesterone. This type can stay in place for 3-5 years.  Copper IUD.This type is wrapped in copper wire. It can stay in place for 10 years.  Permanent methods of contraception Female tubal ligation In this method, a woman's fallopian tubes are sealed, tied, or blocked during surgery to prevent eggs from traveling to the uterus. Hysteroscopic sterilization In this method, a small, flexible insert is placed into each fallopian tube. The inserts cause scar tissue  to form in the fallopian tubes and block them, so sperm cannot reach an egg. The procedure takes about 3 months to be effective. Another form of birth control must be used during those 3 months. Female sterilization This is a procedure to tie off the tubes that carry sperm (vasectomy). After the procedure, the man can still ejaculate fluid (semen). Natural planning methods Natural family planning In this method, a couple does not have sex on days when the woman could become pregnant. Calendar method This means keeping track of the length of each menstrual cycle, identifying the days when pregnancy can happen, and not having sex on those days. Ovulation method In this method, a couple avoids sex during ovulation. Symptothermal method This method involves not having sex during ovulation. The woman typically checks for ovulation by watching changes in her temperature and in the consistency of cervical mucus. Post-ovulation method In this method, a couple waits to have sex until after ovulation.  Summary  Contraception, also called birth control, means methods or devices that prevent pregnancy.  Hormonal methods of contraception include implants, injections, pills, patches, vaginal rings, and emergency contraceptives.  Barrier methods of contraception can include female condoms, female condoms, diaphragms, cervical caps, sponges, and spermicides.  There are two types of IUDs (intrauterine devices). An IUD can be put in a woman's uterus to prevent pregnancy for 3-5 years.  Permanent sterilization can be done through a procedure for males, females, or both.  Natural family planning methods involve not having sex on days when the woman could become pregnant. This information is not intended to replace advice given to you by your health care provider. Make sure you discuss any questions you have with your health care provider. Document Released: 06/19/2005 Document Revised: 07/22/2016 Document  Reviewed: 07/22/2016 Elsevier Interactive Patient Education  2018 [employer].

## 2017-11-16 NOTE — Addendum Note (Signed)
Addended by: Lyndal Rainbow on: 11/16/2017 09:26 AM   Modules accepted: Orders

## 2017-11-16 NOTE — Progress Notes (Signed)
   PRENATAL VISIT NOTE  Subjective:  Robin Robertson is a 32 y.o. G2P1001 at [redacted]w[redacted]d being seen today for ongoing prenatal care.  She is currently monitored for the following issues for this low-risk pregnancy and has Myasthenia gravis ([name]); Status post vacuum-assisted vaginal delivery; Dizziness; Right upper quadrant abdominal pain; Abdominal pain, right lower quadrant; Supervision of other normal pregnancy, antepartum; and Rubella non-immune status, antepartum on their problem list.  Patient reports no complaints.  Contractions: Not present. Vag. Bleeding: None.  Movement: Present. Denies leaking of fluid.   The following portions of the patient's history were reviewed and updated as appropriate: allergies, current medications, past family history, past medical history, past social history, past surgical history and problem list. Problem list updated.  Objective:   Vitals:   11/16/17 0838  BP: (!) 95/59  Pulse: 79  Temp: (!) 97.3 F (36.3 C)  Weight: 125 lb (56.7 kg)    Fetal Status: Fetal Heart Rate (bpm): 150 Fundal Height: 27 cm Movement: Present     General:  Alert, oriented and cooperative. Patient is in no acute distress.  Skin: Skin is warm and dry. No rash noted.   Cardiovascular: Normal heart rate noted  Respiratory: Normal respiratory effort, no problems with respiration noted  Abdomen: Soft, gravid, appropriate for gestational age.  Pain/Pressure: Present     Pelvic: Cervical exam deferred        Extremities: Normal range of motion.  Edema: None  Mental Status: Normal mood and affect. Normal behavior. Normal judgment and thought content.   Assessment and Plan:  Pregnancy: G2P1001 at [redacted]w[redacted]d  1. Supervision of other normal pregnancy, antepartum - Undecided about BC. List given - HIV antibody (with reflex) - CBC - RPR - 2Hr GTT w/ 1 Hr Carpenter 75 g  2. Myasthenia gravis ([name]) - Has appt in a few weeks w/ neurologist  Preterm labor symptoms and general  obstetric precautions including but not limited to vaginal bleeding, contractions, leaking of fluid and fetal movement were reviewed in detail with the patient. Please refer to After Visit Summary for other counseling recommendations.  Return in about 2 weeks (around 11/30/2017) for ROB.  Future Appointments  Date Time Provider [place]  11/28/2017  8:00 AM Ward Givens, NP [facility] None    Manya Silvas, [place]

## 2017-11-16 NOTE — Progress Notes (Incomplete)
   PRENATAL VISIT NOTE  Subjective:  Robin Robertson is a 32 y.o. G2P1001 at [redacted]w[redacted]d being seen today for ongoing prenatal care.  She is currently monitored for the following issues for this {Blank single:19197::"high-risk","low-risk"} pregnancy and has Myasthenia gravis ([place]); Status post vacuum-assisted vaginal delivery; Dizziness; Right upper quadrant abdominal pain; Abdominal pain, right lower quadrant; Supervision of other normal pregnancy, antepartum; and Rubella non-immune status, antepartum on their problem list.  Patient reports {sx:14538}.  Contractions: Not present. Vag. Bleeding: None.  Movement: Present. Denies leaking of fluid.   The following portions of the patient's history were reviewed and updated as appropriate: allergies, current medications, past family history, past medical history, past social history, past surgical history and problem list. Problem list updated.  Objective:   Vitals:   11/16/17 0838  BP: (!) 95/59  Pulse: 79  Temp: (!) 97.3 F (36.3 C)  Weight: 125 lb (56.7 kg)    Fetal Status: Fetal Heart Rate (bpm): 150 Fundal Height: 27 cm Movement: Present     General:  Alert, oriented and cooperative. Patient is in no acute distress.  Skin: Skin is warm and dry. No rash noted.   Cardiovascular: Normal heart rate noted  Respiratory: Normal respiratory effort, no problems with respiration noted  Abdomen: Soft, gravid, appropriate for gestational age.  Pain/Pressure: Present     Pelvic: {Blank single:19197::"Cervical exam performed","Cervical exam deferred"}        Extremities: Normal range of motion.  Edema: None  Mental Status: Normal mood and affect. Normal behavior. Normal judgment and thought content.   Assessment and Plan:  Pregnancy: G2P1001 at [redacted]w[redacted]d  1. Supervision of other normal pregnancy, antepartum *** - HIV antibody (with reflex) - CBC - RPR - 2Hr GTT w/ 1 Hr Carpenter 75 g  2. Myasthenia gravis ([place]) ***  {Blank  single:19197::"Term","Preterm"} labor symptoms and general obstetric precautions including but not limited to vaginal bleeding, contractions, leaking of fluid and fetal movement were reviewed in detail with the patient. Please refer to After Visit Summary for other counseling recommendations.  Return in about 2 weeks (around 11/30/2017) for ROB.  Future Appointments  Date Time Provider [place]  11/28/2017  8:00 AM Ward Givens, NP [facility] None    Manya Silvas, [place]

## 2017-11-19 ENCOUNTER — Telehealth: Payer: Self-pay

## 2017-11-19 LAB — CBC
HCT: 34.9 % — ABNORMAL LOW (ref 35.0–45.0)
Hemoglobin: 11.9 g/dL (ref 11.7–15.5)
MCH: 30.6 pg (ref 27.0–33.0)
MCHC: 34.1 g/dL (ref 32.0–36.0)
MCV: 89.7 fL (ref 80.0–100.0)
MPV: 10.2 fL (ref 7.5–12.5)
PLATELETS: 204 10*3/uL (ref 140–400)
RBC: 3.89 10*6/uL (ref 3.80–5.10)
RDW: 13.5 % (ref 11.0–15.0)
WBC: 10.8 10*3/uL (ref 3.8–10.8)

## 2017-11-19 LAB — 2HR GTT W 1 HR, CARPENTER, 75 G
Glucose, 1 Hr, Gest: 78 mg/dL (ref 65–179)
Glucose, 2 Hr, Gest: 80 mg/dL (ref 65–152)
Glucose, Fasting, Gest: 78 mg/dL (ref 65–91)

## 2017-11-19 LAB — RPR: RPR: NONREACTIVE

## 2017-11-19 LAB — HIV ANTIBODY (ROUTINE TESTING W REFLEX): HIV 1&2 Ab, 4th Generation: NONREACTIVE

## 2017-11-19 NOTE — Telephone Encounter (Signed)
Spoke with pt and she is aware that she passed her 2 hour GTT

## 2017-11-28 ENCOUNTER — Encounter: Payer: Self-pay | Admitting: Adult Health

## 2017-11-28 ENCOUNTER — Ambulatory Visit: Payer: 59 | Admitting: Adult Health

## 2017-11-28 VITALS — BP 95/53 | HR 74 | Ht 64.0 in | Wt 126.8 lb

## 2017-11-28 DIAGNOSIS — R51 Headache: Secondary | ICD-10-CM

## 2017-11-28 DIAGNOSIS — Z3A30 30 weeks gestation of pregnancy: Secondary | ICD-10-CM

## 2017-11-28 DIAGNOSIS — R519 Headache, unspecified: Secondary | ICD-10-CM

## 2017-11-28 DIAGNOSIS — G7000 Myasthenia gravis without (acute) exacerbation: Secondary | ICD-10-CM

## 2017-11-28 NOTE — Progress Notes (Signed)
I have read the note, and I agree with the clinical assessment and plan.  Baird Polinski K Ebrima Ranta   

## 2017-11-28 NOTE — Patient Instructions (Signed)
Your Plan:  Continue to monitor symptoms If your symptoms worsen or you develop new symptoms please let us know.   Thank you for coming to see us at [facility]. I hope we have been able to provide you high quality care today.  You may receive a patient satisfaction survey over the next few weeks. We would appreciate your feedback and comments so that we may continue to improve ourselves and the health of our patients.   

## 2017-11-28 NOTE — Progress Notes (Signed)
PATIENT: Robin Robertson DOB: 02-12-1986  REASON FOR VISIT: follow up HISTORY FROM: patient  HISTORY OF PRESENT ILLNESS: Today 11/28/17 Robin Robertson is a 32 year old female with a history of myasthenia gravis and migraine headaches.  She returns today for follow-up.  She is currently [redacted] weeks pregnant.  She reports that she is been doing well.  She denies any significant headaches since her first trimester.  She states that she only has shortness of breath when the baby is laying a certain way.  She denies any worsening ptosis.  Denies any trouble chewing or swallowing.  She is currently not on any medication for myasthenia gravis or migraine headaches.  She denies any new symptoms.  She returns today for an evaluation.  HISTORY 06/21/17  Robin Robertson is a 32 year old female with a history of myasthenia gravis and migraine headaches.  She returns today for follow-up.  She reports that she recently found that she is [redacted] weeks pregnant.  She has a pending OB/GYN appointment.  The patient is not on any medication for myasthenia gravis.  The patient was on gabapentin over the time of her headaches.  Her she reports that she stopped the medication prior to becoming pregnant.  She states her headaches are under relatively good control.  She reported she had a mild headache before we recently had snow.  She states that she has been having a head cold for the last month.  She states that there are times that she feels weaker she denies worsening ptosis on the right.  Denies diplopia.  Denies any trouble swallowing.  She does report some shortness of breath but feels that this is related to her head cold.  She returns today for an evaluation.   REVIEW OF SYSTEMS: Out of a complete 14 system review of symptoms, the patient complains only of the following symptoms, and all other reviewed systems are negative.  Shortness of breath, runny nose ALLERGIES: Allergies  Allergen Reactions  . Fentanyl  Anaphylaxis    Stopped breathing, heart rate dropped to 8bpm  . Influenza Vaccines Shortness Of Breath    Chest pain, Left side of body went numb for 2 weeks  . Vicodin [Hydrocodone-Acetaminophen] Rash    Anger and Black outs    HOME MEDICATIONS: Outpatient Medications Prior to Visit  Medication Sig Dispense Refill  . Prenatal Vit-Fe Fumarate-FA (PRENATAL VITAMIN PLUS LOW IRON) 27-1 MG TABS      No facility-administered medications prior to visit.     PAST MEDICAL HISTORY: Past Medical History:  Diagnosis Date  . Headache   . Myasthenia gravis ([place])   . Myasthenia gravis ([place])   . Post partum depression     PAST SURGICAL HISTORY: Past Surgical History:  Procedure Laterality Date  . WISDOM TOOTH EXTRACTION      FAMILY HISTORY: Family History  Problem Relation Age of Onset  . Hypertension Father   . Cerebrovascular Disease Maternal Grandmother   . Heart disease Paternal Grandmother   . Myasthenia gravis Paternal Grandmother   . Hypertension Paternal Grandfather     SOCIAL HISTORY: Social History   Socioeconomic History  . Marital status: Married    Spouse name: Not on file  . Number of children: 0  . Years of education: associates  . Highest education level: Not on file  Occupational History  . Occupation: [employer]  Social Needs  . Financial resource strain: Not on file  . Food insecurity:    Worry: Not on  file    Inability: Not on file  . Transportation needs:    Medical: Not on file    Non-medical: Not on file  Tobacco Use  . Smoking status: Never Smoker  . Smokeless tobacco: Never Used  Substance and Sexual Activity  . Alcohol use: No    Alcohol/week: 0.0 oz  . Drug use: No  . Sexual activity: Yes    Partners: Male    Birth control/protection: None  Lifestyle  . Physical activity:    Days per week: Not on file    Minutes per session: Not on file  . Stress: Not on file  Relationships  . Social connections:    Talks on phone: Not on  file    Gets together: Not on file    Attends religious service: Not on file    Active member of club or organization: Not on file    Attends meetings of clubs or organizations: Not on file    Relationship status: Not on file  . Intimate partner violence:    Fear of current or ex partner: Not on file    Emotionally abused: Not on file    Physically abused: Not on file    Forced sexual activity: Not on file  Other Topics Concern  . Not on file  Social History Narrative   Patient drinks 1-3 cups of caffeine weekly.   Patient is right handed.   Has child, CHLOE.      PHYSICAL EXAM  Vitals:   11/28/17 0806  BP: (!) 95/53  Pulse: 74  Weight: 126 lb 12.8 oz (57.5 kg)  Height: 5\' 4"  (1.626 m)   Body mass index is 21.77 kg/m.  Generalized: Well developed, in no acute distress   Neurological examination  Mentation: Alert oriented to time, place, history taking. Follows all commands speech and language fluent Cranial nerve II-XII: Pupils were equal round reactive to light. Extraocular movements were full, visual field were full on confrontational test.  Ptosis noted in the right eye.  Facial sensation and strength were normal. Uvula tongue midline. Head turning and shoulder shrug  were normal and symmetric.  With superior gaze for 1 minute no worsening ptosis noted. Motor: The motor testing reveals 5 over 5 strength of all 4 extremities. Good symmetric motor tone is noted throughout.  With arms abducted for 1 minute no weakness noted. Sensory: Sensory testing is intact to soft touch on all 4 extremities. No evidence of extinction is noted.  Coordination: Cerebellar testing reveals good finger-nose-finger and heel-to-shin bilaterally.  Gait and station: Gait is normal.  Reflexes: Deep tendon reflexes are symmetric and normal bilaterally.   DIAGNOSTIC DATA (LABS, IMAGING, TESTING) - I reviewed patient records, labs, notes, testing and imaging myself where available.  Lab Results    Component Value Date   WBC 10.8 11/16/2017   HGB 11.9 11/16/2017   HCT 34.9 (L) 11/16/2017   MCV 89.7 11/16/2017   PLT 204 11/16/2017      Component Value Date/Time   NA 138 11/01/2016 0958   K 3.9 11/01/2016 0958   CL 105 11/01/2016 0958   CO2 21 11/01/2016 0958   GLUCOSE 105 (H) 11/01/2016 0958   BUN 14 11/01/2016 0958   CREATININE 0.65 11/01/2016 0958   CALCIUM 8.5 (L) 11/01/2016 0958   PROT 6.4 11/01/2016 0958   ALBUMIN 4.0 11/01/2016 0958   AST 15 11/01/2016 0958   ALT 11 11/01/2016 0958   ALKPHOS 44 11/01/2016 0958   BILITOT 0.6 11/01/2016  1610   GFRNONAA >89 11/01/2016 0958   GFRAA >89 11/01/2016 0958    Lab Results  Component Value Date   TSH 0.31 (L) 11/01/2016      ASSESSMENT AND PLAN 32 y.o. year old female  has a past medical history of Headache, Myasthenia gravis ([place]), Myasthenia gravis ([place]), and Post partum depression. here with:  1.  Myasthenia gravis 2.  Migraine headaches 3.  [redacted] weeks pregnant   Overall the patient is doing well.  She we will continue to monitor her symptoms.  She is currently not on any medication for myasthenia gravis or migraine headaches.  Her symptoms have been controlled.  She will follow-up in 6 months or sooner if needed.    Ward Givens, MSN, NP-C 11/28/2017, 7:58 AM [facility] [address] [place], [place] [address] [phone]

## 2017-12-10 ENCOUNTER — Ambulatory Visit (INDEPENDENT_AMBULATORY_CARE_PROVIDER_SITE_OTHER): Payer: 59 | Admitting: Advanced Practice Midwife

## 2017-12-10 VITALS — BP 103/64 | Wt 129.0 lb

## 2017-12-10 DIAGNOSIS — R102 Pelvic and perineal pain: Secondary | ICD-10-CM

## 2017-12-10 DIAGNOSIS — O26899 Other specified pregnancy related conditions, unspecified trimester: Secondary | ICD-10-CM

## 2017-12-10 DIAGNOSIS — G7000 Myasthenia gravis without (acute) exacerbation: Secondary | ICD-10-CM

## 2017-12-10 DIAGNOSIS — Z3480 Encounter for supervision of other normal pregnancy, unspecified trimester: Secondary | ICD-10-CM

## 2017-12-10 NOTE — Patient Instructions (Signed)

## 2017-12-10 NOTE — Progress Notes (Signed)
   PRENATAL VISIT NOTE  Subjective:  Robin Robertson is a 32 y.o. G2P1001 at [redacted]w[redacted]d being seen today for ongoing prenatal care.  She is currently monitored for the following issues for this high-risk pregnancy and has Myasthenia gravis ([place]); Status post vacuum-assisted vaginal delivery; Dizziness; Right upper quadrant abdominal pain; Abdominal pain, right lower quadrant; Supervision of other normal pregnancy, antepartum; and Rubella non-immune status, antepartum on their problem list.  Patient reports occasional sharp lower left and right abdominal pain.  Contractions: Not present. Vag. Bleeding: None.  Movement: Present. Denies leaking of fluid.   The following portions of the patient's history were reviewed and updated as appropriate: allergies, current medications, past family history, past medical history, past social history, past surgical history and problem list. Problem list updated.  Objective:   Vitals:   12/10/17 1049  BP: 103/64  Weight: 129 lb (58.5 kg)    Fetal Status: Fetal Heart Rate (bpm): 139 Fundal Height: 30 cm Movement: Present     General:  Alert, oriented and cooperative. Patient is in no acute distress.  Skin: Skin is warm and dry. No rash noted.   Cardiovascular: Normal heart rate noted  Respiratory: Normal respiratory effort, no problems with respiration noted  Abdomen: Soft, gravid, appropriate for gestational age.  Pain/Pressure: Present     Pelvic: Cervical exam deferred        Extremities: Normal range of motion.  Edema: None  Mental Status: Normal mood and affect. Normal behavior. Normal judgment and thought content.   Assessment and Plan:  Pregnancy: G2P1001 at [redacted]w[redacted]d  1. Supervision of other normal pregnancy, antepartum   2. Myasthenia gravis [facility]) --Saw neurologist on 5/29, normal report, recommend f/u in 6 months  3. Pain of round ligament affecting pregnancy, antepartum --occasional sharp pain when going from sitting to  standing. --Rest/ice/heat/warm bath/Tylenol/good ergonomics/pregnancy support belt for pain.  Preterm labor symptoms and general obstetric precautions including but not limited to vaginal bleeding, contractions, leaking of fluid and fetal movement were reviewed in detail with the patient. Please refer to After Visit Summary for other counseling recommendations.  No follow-ups on file.  Future Appointments  Date Time Provider [place]  06/17/2018 11:00 AM Ward Givens, NP [facility] None    Fatima Blank, CNM

## 2017-12-17 ENCOUNTER — Encounter: Payer: Self-pay | Admitting: Advanced Practice Midwife

## 2018-01-18 ENCOUNTER — Ambulatory Visit (INDEPENDENT_AMBULATORY_CARE_PROVIDER_SITE_OTHER): Payer: 59 | Admitting: Advanced Practice Midwife

## 2018-01-18 VITALS — BP 100/62 | HR 75 | Wt 131.0 lb

## 2018-01-18 DIAGNOSIS — Z113 Encounter for screening for infections with a predominantly sexual mode of transmission: Secondary | ICD-10-CM | POA: Diagnosis not present

## 2018-01-18 DIAGNOSIS — Z3493 Encounter for supervision of normal pregnancy, unspecified, third trimester: Secondary | ICD-10-CM

## 2018-01-18 DIAGNOSIS — Z3480 Encounter for supervision of other normal pregnancy, unspecified trimester: Secondary | ICD-10-CM

## 2018-01-18 DIAGNOSIS — O26843 Uterine size-date discrepancy, third trimester: Secondary | ICD-10-CM

## 2018-01-18 DIAGNOSIS — J40 Bronchitis, not specified as acute or chronic: Secondary | ICD-10-CM

## 2018-01-18 DIAGNOSIS — G7000 Myasthenia gravis without (acute) exacerbation: Secondary | ICD-10-CM

## 2018-01-18 DIAGNOSIS — Z3A37 37 weeks gestation of pregnancy: Secondary | ICD-10-CM

## 2018-01-18 DIAGNOSIS — Z362 Encounter for other antenatal screening follow-up: Secondary | ICD-10-CM

## 2018-01-18 LAB — OB RESULTS CONSOLE GC/CHLAMYDIA: GC PROBE AMP, GENITAL: NEGATIVE

## 2018-01-18 LAB — OB RESULTS CONSOLE GBS: GBS: NEGATIVE

## 2018-01-18 MED ORDER — AZITHROMYCIN 250 MG PO TABS: ORAL_TABLET | ORAL | 1 refills | Status: DC

## 2018-01-18 NOTE — Progress Notes (Signed)
   PRENATAL VISIT NOTE  Subjective:  Robin Robertson is a 32 y.o. G2P1001 at [redacted]w[redacted]d being seen today for ongoing prenatal care.  She is currently monitored for the following issues for this high-risk pregnancy and has Myasthenia gravis ([place]); Status post vacuum-assisted vaginal delivery; Dizziness; Right upper quadrant abdominal pain; Abdominal pain, right lower quadrant; Supervision of other normal pregnancy, antepartum; Rubella non-immune status, antepartum; and Uterine size date discrepancy pregnancy, third trimester on their problem list.  Patient reports occasional contractions and Cough adn congestion x 1 month that has not improved w/ OTC meds. Denies fever, chills, allergy Sx. Mild SOB w/ exertion that she attributes to Nml pregnancy changes. Does think it feels like MG exacerbation.  Contractions: Not present. Vag. Bleeding: None.  Movement: Present. Denies leaking of fluid.   The following portions of the patient's history were reviewed and updated as appropriate: allergies, current medications, past family history, past medical history, past social history, past surgical history and problem list. Problem list updated.  Objective:   Vitals:   01/18/18 1027  BP: 100/62  Pulse: 75  Weight: 131 lb (59.4 kg)    Fetal Status: Fetal Heart Rate (bpm): 138 Fundal Height: 33 cm Movement: Present  Presentation: Vertex  General:  Alert, oriented and cooperative. Patient is in no acute distress.  Skin: Skin is warm and dry. No rash noted.   Cardiovascular: Normal heart rate noted  Respiratory: Normal respiratory effort, no problems with respiration noted  Abdomen: Soft, gravid, appropriate for gestational age.  Pain/Pressure: Present     Pelvic: Cervical exam performed Dilation: 3 Effacement (%): 50 Station: -1  Extremities: Normal range of motion.  Edema: None  Mental Status: Normal mood and affect. Normal behavior. Normal judgment and thought content.   Assessment and Plan:    Pregnancy: G2P1001 at [redacted]w[redacted]d  1. Encounter for supervision of normal pregnancy in third trimester, unspecified gravidity  - Culture, beta strep (group b only) - Cervicovaginal ancillary only - [place] [facility] OB FOLLOW UP; Future  2. Myasthenia gravis ([place])  - NO FENTANYL OR MAG in labor - [place] [facility] FOLLOW UP; Future  3. Supervision of other normal pregnancy, antepartum   4. Uterine size date discrepancy pregnancy, third trimester--possibly 2/2 baby engaged in pelvis.   - [place] [facility] FOLLOW UP; Future  5. [redacted] weeks gestation of pregnancy  - [place] [facility] FOLLOW UP; Future  6. Encounter for other antenatal screening follow-up  - [place] [facility] FOLLOW UP; Future  7. Bronchitis  - Discussed that Sx could also be allergy or viral. Since they are more C/W infectious etiology and she doesn't have HX seasonal allergies and since she has MG is at increased risk for MG exacerbation 2/2 URI will Tx w/ Azithromycin (which she has taken in the past w/out it causing MG exacerbation) - azithromycin (ZITHROMAX) 250 MG tablet; Take as directed: Two pills by mouth the first day, then one pill every day until completed  Dispense: 6 tablet; Refill: 1  Term labor symptoms and general obstetric precautions including but not limited to vaginal bleeding, contractions, leaking of fluid and fetal movement were reviewed in detail with the patient. Please refer to After Visit Summary for other counseling recommendations.  Return in about 1 week (around 01/25/2018) for [place].  Future Appointments  Date Time Provider [place]  01/25/2018  8:10 AM Manya Silvas, [place] [facility]  06/17/2018 11:00 AM Ward Givens, NP [facility] None    Manya Silvas, [place]

## 2018-01-18 NOTE — Progress Notes (Incomplete)
   PRENATAL VISIT NOTE  Subjective:  Robin Robertson is a 32 y.o. G2P1001 at [redacted]w[redacted]d being seen today for ongoing prenatal care.  She is currently monitored for the following issues for this {Blank single:19197::"high-risk","low-risk"} pregnancy and has Myasthenia gravis ([place]); Status post vacuum-assisted vaginal delivery; Dizziness; Right upper quadrant abdominal pain; Abdominal pain, right lower quadrant; Supervision of other normal pregnancy, antepartum; Rubella non-immune status, antepartum; and Uterine size date discrepancy pregnancy, third trimester on their problem list.  Patient reports {sx:14538}.  Contractions: Not present. Vag. Bleeding: None.  Movement: Present. Denies leaking of fluid.   The following portions of the patient's history were reviewed and updated as appropriate: allergies, current medications, past family history, past medical history, past social history, past surgical history and problem list. Problem list updated.  Objective:   Vitals:   01/18/18 1027  BP: 100/62  Pulse: 75  Weight: 131 lb (59.4 kg)    Fetal Status:     Movement: Present     General:  Alert, oriented and cooperative. Patient is in no acute distress.  Skin: Skin is warm and dry. No rash noted.   Cardiovascular: Normal heart rate noted  Respiratory: Normal respiratory effort, no problems with respiration noted  Abdomen: Soft, gravid, appropriate for gestational age.  Pain/Pressure: Present     Pelvic: {Blank single:19197::"Cervical exam performed","Cervical exam deferred"}        Extremities: Normal range of motion.  Edema: None  Mental Status: Normal mood and affect. Normal behavior. Normal judgment and thought content.   Assessment and Plan:  Pregnancy: G2P1001 at [redacted]w[redacted]d  1. Encounter for supervision of normal pregnancy in third trimester, unspecified gravidity *** - Culture, beta strep (group b only) - Cervicovaginal ancillary only - [place] [facility] OB FOLLOW UP; Future  2. Myasthenia  gravis ([place]) *** - [place] [facility] OB FOLLOW UP; Future  3. Supervision of other normal pregnancy, antepartum ***  4. Uterine size date discrepancy pregnancy, third trimester *** - [place] [facility] OB FOLLOW UP; Future  5. [redacted] weeks gestation of pregnancy *** - [place] [facility] OB FOLLOW UP; Future  6. Encounter for other antenatal screening follow-up *** - [place] M[facility] OB FOLLOW UP; Future  7. Bronchitis *** - azithromycin (ZITHROMAX) 250 MG tablet; Take as directed: Two pills by mouth the first day, then one pill every day until completed  Dispense: 6 tablet; Refill: 1  {Blank single:19197::"Term","Preterm"} labor symptoms and general obstetric precautions including but not limited to vaginal bleeding, contractions, leaking of fluid and fetal movement were reviewed in detail with the patient. Please refer to After Visit Summary for other counseling recommendations.  Return in about 1 week (around 01/25/2018) for [place].  Future Appointments  Date Time Provider [place]  06/17/2018 11:00 AM Ward Givens, NP [facility] None    Manya Silvas, [place]

## 2018-01-18 NOTE — Progress Notes (Incomplete)
   PRENATAL VISIT NOTE  Subjective:  Robin Robertson is a 32 y.o. G2P1001 at [redacted]w[redacted]d being seen today for ongoing prenatal care.  She is currently monitored for the following issues for this {Blank single:19197::"high-risk","low-risk"} pregnancy and has Myasthenia gravis ([place]); Status post vacuum-assisted vaginal delivery; Dizziness; Right upper quadrant abdominal pain; Abdominal pain, right lower quadrant; Supervision of other normal pregnancy, antepartum; Rubella non-immune status, antepartum; and Uterine size date discrepancy pregnancy, third trimester on their problem list.  Patient reports {sx:14538}.  Contractions: Not present. Vag. Bleeding: None.  Movement: Present. Denies leaking of fluid.   The following portions of the patient's history were reviewed and updated as appropriate: allergies, current medications, past family history, past medical history, past social history, past surgical history and problem list. Problem list updated.  Objective:   Vitals:   01/18/18 1027  BP: 100/62  Pulse: 75  Weight: 131 lb (59.4 kg)    Fetal Status:     Movement: Present     General:  Alert, oriented and cooperative. Patient is in no acute distress.  Skin: Skin is warm and dry. No rash noted.   Cardiovascular: Normal heart rate noted  Respiratory: Normal respiratory effort, no problems with respiration noted  Abdomen: Soft, gravid, appropriate for gestational age.  Pain/Pressure: Present     Pelvic: {Blank single:19197::"Cervical exam performed","Cervical exam deferred"}        Extremities: Normal range of motion.  Edema: None  Mental Status: Normal mood and affect. Normal behavior. Normal judgment and thought content.   Assessment and Plan:  Pregnancy: G2P1001 at [redacted]w[redacted]d  1. Encounter for supervision of normal pregnancy in third trimester, unspecified gravidity *** - Culture, beta strep (group b only) - Cervicovaginal ancillary only - [place] [facility] OB FOLLOW UP; Future  2. Myasthenia  gravis ([place]) *** - [place] [facility] OB FOLLOW UP; Future  3. Supervision of other normal pregnancy, antepartum ***  4. Uterine size date discrepancy pregnancy, third trimester *** - [place] [facility] OB FOLLOW UP; Future  5. [redacted] weeks gestation of pregnancy *** - [place] [facility] OB FOLLOW UP; Future  6. Encounter for other antenatal screening follow-up *** - [place] [facility] OB FOLLOW UP; Future  7. Bronchitis *** - azithromycin (ZITHROMAX) 250 MG tablet; Take as directed: Two pills by mouth the first day, then one pill every day until completed  Dispense: 6 tablet; Refill: 1  {Blank single:19197::"Term","Preterm"} labor symptoms and general obstetric precautions including but not limited to vaginal bleeding, contractions, leaking of fluid and fetal movement were reviewed in detail with the patient. Please refer to After Visit Summary for other counseling recommendations.  Return in about 1 week (around 01/25/2018) for [place].  Future Appointments  Date Time Provider [place]  06/17/2018 11:00 AM Ward Givens, NP [facility] None    Manya Silvas, [place]

## 2018-01-21 ENCOUNTER — Ambulatory Visit (HOSPITAL_COMMUNITY)
Admission: RE | Admit: 2018-01-21 | Discharge: 2018-01-21 | Disposition: A | Discharge status: Home / Self Care | Payer: 59 | Source: Ambulatory Visit | Attending: Advanced Practice Midwife | Admitting: Advanced Practice Midwife

## 2018-01-21 ENCOUNTER — Other Ambulatory Visit: Payer: Self-pay | Admitting: Advanced Practice Midwife

## 2018-01-21 ENCOUNTER — Encounter (HOSPITAL_COMMUNITY): Payer: Self-pay

## 2018-01-21 DIAGNOSIS — G7000 Myasthenia gravis without (acute) exacerbation: Secondary | ICD-10-CM

## 2018-01-21 DIAGNOSIS — Z3A37 37 weeks gestation of pregnancy: Secondary | ICD-10-CM | POA: Diagnosis not present

## 2018-01-21 DIAGNOSIS — O2693 Pregnancy related conditions, unspecified, third trimester: Secondary | ICD-10-CM | POA: Insufficient documentation

## 2018-01-21 DIAGNOSIS — O26843 Uterine size-date discrepancy, third trimester: Secondary | ICD-10-CM

## 2018-01-21 DIAGNOSIS — Z3493 Encounter for supervision of normal pregnancy, unspecified, third trimester: Secondary | ICD-10-CM

## 2018-01-21 DIAGNOSIS — Z362 Encounter for other antenatal screening follow-up: Secondary | ICD-10-CM

## 2018-01-22 ENCOUNTER — Inpatient Hospital Stay (HOSPITAL_COMMUNITY)
Admission: AD | Admit: 2018-01-22 | Discharge: 2018-01-22 | Disposition: A | Discharge status: Home / Self Care | Payer: 59 | Source: Ambulatory Visit | Attending: Obstetrics and Gynecology | Admitting: Obstetrics and Gynecology

## 2018-01-22 ENCOUNTER — Encounter (HOSPITAL_COMMUNITY): Payer: Self-pay | Admitting: *Deleted

## 2018-01-22 DIAGNOSIS — O471 False labor at or after 37 completed weeks of gestation: Secondary | ICD-10-CM | POA: Insufficient documentation

## 2018-01-22 DIAGNOSIS — Z2839 Other underimmunization status: Secondary | ICD-10-CM

## 2018-01-22 DIAGNOSIS — Z3689 Encounter for other specified antenatal screening: Secondary | ICD-10-CM

## 2018-01-22 DIAGNOSIS — O479 False labor, unspecified: Secondary | ICD-10-CM

## 2018-01-22 DIAGNOSIS — Z3A37 37 weeks gestation of pregnancy: Secondary | ICD-10-CM | POA: Insufficient documentation

## 2018-01-22 DIAGNOSIS — Z283 Underimmunization status: Secondary | ICD-10-CM

## 2018-01-22 DIAGNOSIS — O9989 Other specified diseases and conditions complicating pregnancy, childbirth and the puerperium: Secondary | ICD-10-CM

## 2018-01-22 DIAGNOSIS — Z3480 Encounter for supervision of other normal pregnancy, unspecified trimester: Secondary | ICD-10-CM

## 2018-01-22 LAB — CERVICOVAGINAL ANCILLARY ONLY
Bacterial vaginitis: NEGATIVE
Candida vaginitis: NEGATIVE
Chlamydia: NEGATIVE
Neisseria Gonorrhea: NEGATIVE
Trichomonas: NEGATIVE

## 2018-01-22 NOTE — MAU Note (Signed)
[place],cnm notified no cervical change , provider reviewed tracing.

## 2018-01-22 NOTE — MAU Note (Signed)
PT SAYS [facility] HURT BAD SINCE 0500. [place] WITH  [place] ON Friday 3 CM.    DENIES AND  MRSA. GBS-  COLLECTED ON Friday.

## 2018-01-23 LAB — CULTURE, BETA STREP (GROUP B ONLY)
MICRO NUMBER:: 90857689
SPECIMEN QUALITY: ADEQUATE

## 2018-01-24 ENCOUNTER — Ambulatory Visit (INDEPENDENT_AMBULATORY_CARE_PROVIDER_SITE_OTHER): Payer: 59 | Admitting: Obstetrics & Gynecology

## 2018-01-24 VITALS — BP 100/64 | HR 75 | Wt 135.0 lb

## 2018-01-24 DIAGNOSIS — G7000 Myasthenia gravis without (acute) exacerbation: Secondary | ICD-10-CM

## 2018-01-24 DIAGNOSIS — Z3480 Encounter for supervision of other normal pregnancy, unspecified trimester: Secondary | ICD-10-CM

## 2018-01-24 NOTE — Progress Notes (Signed)
   PRENATAL VISIT NOTE  Subjective:  Robin Robertson is a 32 y.o. G2P1001 at [redacted]w[redacted]d being seen today for ongoing prenatal care.  She is currently monitored for the following issues for this low-risk pregnancy and has Myasthenia gravis ([place]); Status post vacuum-assisted vaginal delivery; Dizziness; Right upper quadrant abdominal pain; Abdominal pain, right lower quadrant; Supervision of other normal pregnancy, antepartum; Rubella non-immune status, antepartum; and Uterine size date discrepancy pregnancy, third trimester on their problem list.  Patient reports no complaints.  Contractions: Irritability. Vag. Bleeding: None.  Movement: Present. Denies leaking of fluid.   The following portions of the patient's history were reviewed and updated as appropriate: allergies, current medications, past family history, past medical history, past social history, past surgical history and problem list. Problem list updated.  Objective:   Vitals:   01/24/18 1455  BP: 100/64  Pulse: 75  Weight: 135 lb (61.2 kg)    Fetal Status: Fetal Heart Rate (bpm): 145   Movement: Present     General:  Alert, oriented and cooperative. Patient is in no acute distress.  Skin: Skin is warm and dry. No rash noted.   Cardiovascular: Normal heart rate noted  Respiratory: Normal respiratory effort, no problems with respiration noted  Abdomen: Soft, gravid, appropriate for gestational age.  Pain/Pressure: Present     Pelvic: Cervical exam performed        Extremities: Normal range of motion.  Edema: None  Mental Status: Normal mood and affect. Normal behavior. Normal judgment and thought content.   Assessment and Plan:  Pregnancy: G2P1001 at [redacted]w[redacted]d  1. Supervision of other normal pregnancy, antepartum   2. Myasthenia gravis ([place])  Preterm labor symptoms and general obstetric precautions including but not limited to vaginal bleeding, contractions, leaking of fluid and fetal movement were reviewed in detail with the  patient. Please refer to After Visit Summary for other counseling recommendations.  No follow-ups on file.  Future Appointments  Date Time Provider [place]  06/17/2018 11:00 AM Ward Givens, NP [facility] None    Emily Filbert, MD

## 2018-01-25 ENCOUNTER — Encounter: Payer: 59 | Admitting: Advanced Practice Midwife

## 2018-01-30 ENCOUNTER — Telehealth (HOSPITAL_COMMUNITY): Payer: Self-pay | Admitting: *Deleted

## 2018-01-30 ENCOUNTER — Other Ambulatory Visit: Payer: Self-pay | Admitting: Advanced Practice Midwife

## 2018-01-30 ENCOUNTER — Ambulatory Visit (INDEPENDENT_AMBULATORY_CARE_PROVIDER_SITE_OTHER): Payer: 59 | Admitting: Obstetrics & Gynecology

## 2018-01-30 VITALS — BP 109/61 | HR 71 | Wt 132.0 lb

## 2018-01-30 DIAGNOSIS — Z3480 Encounter for supervision of other normal pregnancy, unspecified trimester: Secondary | ICD-10-CM

## 2018-01-30 DIAGNOSIS — G7000 Myasthenia gravis without (acute) exacerbation: Secondary | ICD-10-CM

## 2018-01-30 NOTE — Progress Notes (Signed)
   PRENATAL VISIT NOTE  Subjective:  Robin Robertson is a 32 y.o. G2P1001 at [redacted]w[redacted]d being seen today for ongoing prenatal care.  She is currently monitored for the following issues for this high-risk pregnancy and has Myasthenia gravis ([place]); Status post vacuum-assisted vaginal delivery; Right upper quadrant abdominal pain; Abdominal pain, right lower quadrant; Supervision of other normal pregnancy, antepartum; Rubella non-immune status, antepartum; and Uterine size date discrepancy pregnancy, third trimester on their problem list.  Patient reports no complaints.  Contractions: Irritability. Vag. Bleeding: None.  Movement: Present. Denies leaking of fluid.   The following portions of the patient's history were reviewed and updated as appropriate: allergies, current medications, past family history, past medical history, past social history, past surgical history and problem list. Problem list updated.  Objective:   Vitals:   01/30/18 0945  BP: 109/61  Pulse: 71  Weight: 132 lb (59.9 kg)    Fetal Status: Fetal Heart Rate (bpm): 140 Fundal Height: 32 cm Movement: Present  Presentation: Vertex  General:  Alert, oriented and cooperative. Patient is in no acute distress.  Skin: Skin is warm and dry. No rash noted.   Cardiovascular: Normal heart rate noted  Respiratory: Normal respiratory effort, no problems with respiration noted  Abdomen: Soft, gravid, appropriate for gestational age.  Pain/Pressure: Present     Pelvic: Cervical exam performed Dilation: 5.5 Effacement (%): 80 Station: -2  Extremities: Normal range of motion.  Edema: None  Mental Status: Normal mood and affect. Normal behavior. Normal judgment and thought content.   Assessment and Plan:  Pregnancy: G2P1001 at [redacted]w[redacted]d  1. Supervision of other normal pregnancy, antepartum Advanced cervical dilation.   Baby Rx complaint.  2. Myasthenia gravis ([place]) Stable.  Will induce at 39 weeks to decrease risk of developing  gestational HTN/Pre E.  She can't receive Magnesium nor Fent  Term labor symptoms and general obstetric precautions including but not limited to vaginal bleeding, contractions, leaking of fluid and fetal movement were reviewed in detail with the patient. Please refer to After Visit Summary for other counseling recommendations.  Return in about 6 weeks (around 03/13/2018).  Future Appointments  Date Time Provider [place]  06/17/2018 11:00 AM Ward Givens, NP [facility] None    Silas [place], MD

## 2018-01-30 NOTE — Telephone Encounter (Signed)
Preadmission screen  

## 2018-02-01 ENCOUNTER — Inpatient Hospital Stay (HOSPITAL_COMMUNITY)
Admission: RE | Admit: 2018-02-01 | Discharge: 2018-02-02 | DRG: 807 | Disposition: A | Discharge status: Home / Self Care | Payer: 59 | Attending: Obstetrics & Gynecology | Admitting: Obstetrics & Gynecology

## 2018-02-01 ENCOUNTER — Inpatient Hospital Stay (HOSPITAL_COMMUNITY): Payer: 59 | Admitting: Anesthesiology

## 2018-02-01 ENCOUNTER — Encounter (HOSPITAL_COMMUNITY): Payer: Self-pay

## 2018-02-01 DIAGNOSIS — Z3480 Encounter for supervision of other normal pregnancy, unspecified trimester: Secondary | ICD-10-CM

## 2018-02-01 DIAGNOSIS — G7000 Myasthenia gravis without (acute) exacerbation: Secondary | ICD-10-CM | POA: Diagnosis present

## 2018-02-01 DIAGNOSIS — O99354 Diseases of the nervous system complicating childbirth: Secondary | ICD-10-CM | POA: Diagnosis present

## 2018-02-01 DIAGNOSIS — Z2839 Other underimmunization status: Secondary | ICD-10-CM

## 2018-02-01 DIAGNOSIS — O99350 Diseases of the nervous system complicating pregnancy, unspecified trimester: Secondary | ICD-10-CM

## 2018-02-01 DIAGNOSIS — Z3A39 39 weeks gestation of pregnancy: Secondary | ICD-10-CM

## 2018-02-01 DIAGNOSIS — O9989 Other specified diseases and conditions complicating pregnancy, childbirth and the puerperium: Secondary | ICD-10-CM

## 2018-02-01 DIAGNOSIS — Z283 Underimmunization status: Secondary | ICD-10-CM

## 2018-02-01 LAB — RPR: RPR Ser Ql: NONREACTIVE

## 2018-02-01 LAB — CBC
HCT: 37.5 % (ref 36.0–46.0)
Hemoglobin: 13 g/dL (ref 12.0–15.0)
MCH: 32.1 pg (ref 26.0–34.0)
MCHC: 34.7 g/dL (ref 30.0–36.0)
MCV: 92.6 fL (ref 78.0–100.0)
Platelets: 150 K/uL (ref 150–400)
RBC: 4.05 MIL/uL (ref 3.87–5.11)
RDW: 13.5 % (ref 11.5–15.5)
WBC: 7.3 K/uL (ref 4.0–10.5)

## 2018-02-01 LAB — TYPE AND SCREEN
ABO/RH(D): A POS
Antibody Screen: NEGATIVE

## 2018-02-01 MED ORDER — SOD CITRATE-CITRIC ACID 500-334 MG/5ML PO SOLN: 30.0000 mL | ORAL | Status: DC | PRN

## 2018-02-01 MED ORDER — SIMETHICONE 80 MG PO CHEW: 80.0000 mg | CHEWABLE_TABLET | ORAL | Status: DC | PRN

## 2018-02-01 MED ORDER — ONDANSETRON HCL 4 MG PO TABS: 4.0000 mg | ORAL_TABLET | ORAL | Status: DC | PRN

## 2018-02-01 MED ORDER — COCONUT OIL OIL: 1.0000 "application " | TOPICAL_OIL | Status: DC | PRN

## 2018-02-01 MED ORDER — ZOLPIDEM TARTRATE 5 MG PO TABS: 5.0000 mg | ORAL_TABLET | Freq: Every evening | ORAL | Status: DC | PRN

## 2018-02-01 MED ORDER — PRENATAL MULTIVITAMIN CH
1.0000 | ORAL_TABLET | Freq: Every day | ORAL | Status: DC
  Administered 2018-02-02: 1 via ORAL
  Filled 2018-02-01: qty 1

## 2018-02-01 MED ORDER — TERBUTALINE SULFATE 1 MG/ML IJ SOLN
0.2500 mg | Freq: Once | INTRAMUSCULAR | Status: DC | PRN
  Filled 2018-02-01: qty 1

## 2018-02-01 MED ORDER — LACTATED RINGERS IV SOLN
500.0000 mL | Freq: Once | INTRAVENOUS | Status: AC
  Administered 2018-02-01: 1000 mL via INTRAVENOUS

## 2018-02-01 MED ORDER — ONDANSETRON HCL 4 MG/2ML IJ SOLN: 4.0000 mg | INTRAMUSCULAR | Status: DC | PRN

## 2018-02-01 MED ORDER — PHENYLEPHRINE 40 MCG/ML (10ML) SYRINGE FOR IV PUSH (FOR BLOOD PRESSURE SUPPORT)
80.0000 ug | PREFILLED_SYRINGE | INTRAVENOUS | Status: DC | PRN
  Administered 2018-02-01: 120 ug via INTRAVENOUS
  Filled 2018-02-01: qty 5

## 2018-02-01 MED ORDER — LIDOCAINE HCL (PF) 1 % IJ SOLN
INTRAMUSCULAR | Status: DC | PRN
  Administered 2018-02-01: 2 mL via EPIDURAL
  Administered 2018-02-01: 5 mL via EPIDURAL
  Administered 2018-02-01: 3 mL via EPIDURAL

## 2018-02-01 MED ORDER — LACTATED RINGERS IV SOLN: 500.0000 mL | INTRAVENOUS | Status: DC | PRN

## 2018-02-01 MED ORDER — OXYTOCIN 40 UNITS IN LACTATED RINGERS INFUSION - SIMPLE MED
1.0000 m[IU]/min | INTRAVENOUS | Status: DC
  Administered 2018-02-01: 2 m[IU]/min via INTRAVENOUS
  Filled 2018-02-01: qty 1000

## 2018-02-01 MED ORDER — OXYTOCIN 40 UNITS IN LACTATED RINGERS INFUSION - SIMPLE MED: 1.0000 m[IU]/min | INTRAVENOUS | Status: DC

## 2018-02-01 MED ORDER — TETANUS-DIPHTH-ACELL PERTUSSIS 5-2.5-18.5 LF-MCG/0.5 IM SUSP: 0.5000 mL | Freq: Once | INTRAMUSCULAR | Status: DC

## 2018-02-01 MED ORDER — EPHEDRINE 5 MG/ML INJ
10.0000 mg | INTRAVENOUS | Status: DC | PRN
  Filled 2018-02-01: qty 2

## 2018-02-01 MED ORDER — LACTATED RINGERS IV SOLN
INTRAVENOUS | Status: DC
  Administered 2018-02-01: 1000 mL via INTRAVENOUS

## 2018-02-01 MED ORDER — ACETAMINOPHEN 325 MG PO TABS: 650.0000 mg | ORAL_TABLET | ORAL | Status: DC | PRN

## 2018-02-01 MED ORDER — SENNOSIDES-DOCUSATE SODIUM 8.6-50 MG PO TABS
2.0000 | ORAL_TABLET | ORAL | Status: DC
  Administered 2018-02-01: 2 via ORAL
  Filled 2018-02-01: qty 2

## 2018-02-01 MED ORDER — ONDANSETRON HCL 4 MG/2ML IJ SOLN: 4.0000 mg | Freq: Four times a day (QID) | INTRAMUSCULAR | Status: DC | PRN

## 2018-02-01 MED ORDER — DIPHENHYDRAMINE HCL 25 MG PO CAPS: 25.0000 mg | ORAL_CAPSULE | Freq: Four times a day (QID) | ORAL | Status: DC | PRN

## 2018-02-01 MED ORDER — DIBUCAINE 1 % RE OINT: 1.0000 "application " | TOPICAL_OINTMENT | RECTAL | Status: DC | PRN

## 2018-02-01 MED ORDER — WITCH HAZEL-GLYCERIN EX PADS: 1.0000 "application " | MEDICATED_PAD | CUTANEOUS | Status: DC | PRN

## 2018-02-01 MED ORDER — OXYTOCIN BOLUS FROM INFUSION
500.0000 mL | Freq: Once | INTRAVENOUS | Status: AC
  Administered 2018-02-01: 500 mL via INTRAVENOUS

## 2018-02-01 MED ORDER — BENZOCAINE-MENTHOL 20-0.5 % EX AERO: 1.0000 "application " | INHALATION_SPRAY | CUTANEOUS | Status: DC | PRN

## 2018-02-01 MED ORDER — LACTATED RINGERS IV SOLN: 500.0000 mL | Freq: Once | INTRAVENOUS | Status: DC

## 2018-02-01 MED ORDER — DIPHENHYDRAMINE HCL 50 MG/ML IJ SOLN: 12.5000 mg | INTRAMUSCULAR | Status: DC | PRN

## 2018-02-01 MED ORDER — BUTORPHANOL TARTRATE 1 MG/ML IJ SOLN: 2.0000 mg | INTRAMUSCULAR | Status: DC | PRN

## 2018-02-01 MED ORDER — SODIUM CHLORIDE 0.9 % IV SOLN
14.0000 mL/h | INTRAVENOUS | Status: DC | PRN
  Administered 2018-02-01 (×2): 14 mL/h via EPIDURAL
  Filled 2018-02-01 (×3): qty 17

## 2018-02-01 MED ORDER — IBUPROFEN 600 MG PO TABS
600.0000 mg | ORAL_TABLET | Freq: Four times a day (QID) | ORAL | Status: DC
  Administered 2018-02-01 – 2018-02-02 (×4): 600 mg via ORAL
  Filled 2018-02-01 (×4): qty 1

## 2018-02-01 MED ORDER — LIDOCAINE HCL (PF) 1 % IJ SOLN
30.0000 mL | INTRAMUSCULAR | Status: DC | PRN
  Filled 2018-02-01: qty 30

## 2018-02-01 MED ORDER — MEASLES, MUMPS & RUBELLA VAC ~~LOC~~ INJ
0.5000 mL | INJECTION | Freq: Once | SUBCUTANEOUS | Status: AC
  Administered 2018-02-02: 0.5 mL via SUBCUTANEOUS
  Filled 2018-02-01: qty 0.5

## 2018-02-01 MED ORDER — PHENYLEPHRINE 40 MCG/ML (10ML) SYRINGE FOR IV PUSH (FOR BLOOD PRESSURE SUPPORT)
80.0000 ug | PREFILLED_SYRINGE | INTRAVENOUS | Status: DC | PRN
  Administered 2018-02-01: 40 ug via INTRAVENOUS
  Administered 2018-02-01: 80 ug via INTRAVENOUS
  Filled 2018-02-01: qty 5
  Filled 2018-02-01: qty 10

## 2018-02-01 MED ORDER — OXYTOCIN 40 UNITS IN LACTATED RINGERS INFUSION - SIMPLE MED: 2.5000 [IU]/h | INTRAVENOUS | Status: DC

## 2018-02-01 MED ORDER — LIDOCAINE-EPINEPHRINE (PF) 2 %-1:200000 IJ SOLN
INTRAMUSCULAR | Status: DC | PRN
  Administered 2018-02-01: 5 mL via EPIDURAL

## 2018-02-01 NOTE — Anesthesia Preprocedure Evaluation (Signed)
Anesthesia Evaluation  Patient identified by MRN, date of birth, ID band Patient awake  General Assessment Comment:FENTANYL ALLERGY  Reviewed: Allergy & Precautions, NPO status , Patient's Chart, lab work & pertinent test results  History of Anesthesia Complications Negative for: history of anesthetic complications  Airway Mallampati: II  TM Distance: >3 FB Neck ROM: Full    Dental no notable dental hx. (+) Dental Advisory Given   Pulmonary neg pulmonary ROS,    Pulmonary exam normal breath sounds clear to auscultation       Cardiovascular negative cardio ROS Normal cardiovascular exam Rhythm:Regular Rate:Normal     Neuro/Psych  Headaches, Myasthenia Gravis, not currently taking any medication, no current symptoms, no hospitalization  Neuromuscular disease negative psych ROS   GI/Hepatic negative GI ROS, Neg liver ROS,   Endo/Other  negative endocrine ROS  Renal/GU negative Renal ROS  negative genitourinary   Musculoskeletal negative musculoskeletal ROS (+)   Abdominal   Peds negative pediatric ROS (+)  Hematology negative hematology ROS (+) Plt 150k   Anesthesia Other Findings   Reproductive/Obstetrics (+) Pregnancy                             Anesthesia Physical  Anesthesia Plan  ASA: II  Anesthesia Plan: Epidural   Post-op Pain Management:    Induction:   PONV Risk Score and Plan: 2 and Treatment may vary due to age or medical condition  Airway Management Planned: Natural Airway  Additional Equipment:   Intra-op Plan:   Post-operative Plan:   Informed Consent: I have reviewed the patients History and Physical, chart, labs and discussed the procedure including the risks, benefits and alternatives for the proposed anesthesia with the patient or authorized representative who has indicated his/her understanding and acceptance.   Dental advisory given  Plan Discussed  with: CRNA  Anesthesia Plan Comments:         Anesthesia Quick Evaluation

## 2018-02-01 NOTE — Anesthesia Pain Management Evaluation Note (Signed)
  [facility] Pain Management Visit Note  Patient: Robin Robertson, 32 y.o., female  "Hello I am a member of the anesthesia team at [facility]. We have an anesthesia team available at all times to provide care throughout the hospital, including epidural management and anesthesia for C-section. I don't know your plan for the delivery whether it a natural birth, water birth, IV sedation, nitrous supplementation, doula or epidural, but we want to meet your pain goals."   1.Was your pain managed to your expectations on prior hospitalizations?   Yes   2.What is your expectation for pain management during this hospitalization?     Epidural  3.How can we help you reach that goal? Epidural when appropriate  Record the patient's initial score and the patient's pain goal.   Pain: 0  Pain Goal: 5 [facility] wants you to be able to say your pain was always managed very well.  Bufford Spikes 02/01/2018

## 2018-02-01 NOTE — H&P (Signed)
LABOR AND DELIVERY ADMISSION HISTORY AND PHYSICAL NOTE  Robin Robertson is a 32 y.o. female G2P1001 with IUP at [redacted]w[redacted]d by LMP c/w 9 wk sono presenting for IOL for Myasthenia Gravis.  She reports positive fetal movement. She denies leakage of fluid or vaginal bleeding.  Prenatal History/Complications: PNC at [facility].  Pregnancy complications:  - Myasthenia Gravis  -Size less than dates on clinical exam, sono showed appropriate fetal growth   Past Medical History: Past Medical History:  Diagnosis Date  . Headache   . Myasthenia gravis ([place])   . Myasthenia gravis ([place])   . Post partum depression     Past Surgical History: Past Surgical History:  Procedure Laterality Date  . WISDOM TOOTH EXTRACTION      Obstetrical History: OB History    Gravida  2   Para  1   Term  1   Preterm      AB      Living  1     SAB      TAB      Ectopic      Multiple  0   Live Births  1           Social History: Social History   Socioeconomic History  . Marital status: Married    Spouse name: Not on file  . Number of children: 0  . Years of education: associates  . Highest education level: Not on file  Occupational History  . Occupation: [employer]  Social Needs  . Financial resource strain: Not on file  . Food insecurity:    Worry: Not on file    Inability: Not on file  . Transportation needs:    Medical: Not on file    Non-medical: Not on file  Tobacco Use  . Smoking status: Never Smoker  . Smokeless tobacco: Never Used  Substance and Sexual Activity  . Alcohol use: No    Alcohol/week: 0.0 oz  . Drug use: No  . Sexual activity: Yes    Partners: Male    Birth control/protection: None  Lifestyle  . Physical activity:    Days per week: Not on file    Minutes per session: Not on file  . Stress: Not on file  Relationships  . Social connections:    Talks on phone: Not on file    Gets together: Not on file    Attends religious service: Not on file   Active member of club or organization: Not on file    Attends meetings of clubs or organizations: Not on file    Relationship status: Not on file  Other Topics Concern  . Not on file  Social History Narrative   Patient drinks 1-3 cups of caffeine weekly.   Patient is right handed.   Has child, CHLOE.    Family History: Family History  Problem Relation Age of Onset  . Hypertension Father   . Cerebrovascular Disease Maternal Grandmother   . Heart disease Paternal Grandmother   . Myasthenia gravis Paternal Grandmother   . Hypertension Paternal Grandfather     Allergies: Allergies  Allergen Reactions  . Fentanyl Anaphylaxis    Stopped breathing, heart rate dropped to 8bpm  . Influenza Vaccines Shortness Of Breath    Chest pain, Left side of body went numb for 2 weeks  . Vicodin [Hydrocodone-Acetaminophen] Rash    Anger and Black outs    Medications Prior to Admission  Medication Sig Dispense Refill Last Dose  . Prenatal  Vit-Fe Fumarate-FA (PRENATAL VITAMIN PLUS LOW IRON) 27-1 MG TABS    Taking     Review of Systems  All systems reviewed and negative except as stated in HPI  Physical Exam Blood pressure 108/64, pulse 63, temperature 98.4 F (36.9 C), temperature source Oral, resp. rate 18, height 5\' 4"  (1.626 m), weight 132 lb (59.9 kg), last menstrual period 05/04/2017, unknown if currently breastfeeding. General appearance: alert, oriented, NAD Lungs: normal respiratory effort Heart: regular rate Abdomen: soft, non-tender; gravid, FH appropriate for GA Extremities: No calf swelling or tenderness Presentation: cephalic Fetal monitoring: 140 bpm, good variability, +acels, no decels  Uterine activity: Irregular  Dilation: 4.5 Effacement (%): 80 Station: -2 Exam by:: D Herr rn  Prenatal labs: ABO, Rh: A/RH(D) POSITIVE/-- (01/11 0931) Antibody: NO ANTIBODIES DETECTED (01/11 0931) Rubella: <0.90 (01/11 0931) RPR: NON-REACTIVE (05/17 0813)  HBsAg: NON-REACTIVE  (01/11 0931)  HIV: NON-REACTIVE (05/17 0813)  GC/Chlamydia: Negative  GBS: Negative (07/19 0000)  2-hr GTT: Normal  Genetic screening:  Normal  Anatomy US: Nml   Prenatal Transfer Tool  Maternal Diabetes: No Genetic Screening: Normal Maternal Ultrasounds/Referrals: Normal Fetal Ultrasounds or other Referrals:  None Maternal Substance Abuse:  No Significant Maternal Medications:  None Significant Maternal Lab Results: Lab values include: Group B Strep negative  Results for orders placed or performed during the hospital encounter of 02/01/18 (from the past 24 hour(s))  CBC   Collection Time: 02/01/18  8:18 AM  Result Value Ref Range   WBC 7.3 4.0 - 10.5 K/uL   RBC 4.05 3.87 - 5.11 MIL/uL   Hemoglobin 13.0 12.0 - 15.0 g/dL   HCT 37.5 36.0 - 46.0 %   MCV 92.6 78.0 - 100.0 fL   MCH 32.1 26.0 - 34.0 pg   MCHC 34.7 30.0 - 36.0 g/dL   RDW 13.5 11.5 - 15.5 %   Platelets 150 150 - 400 K/uL    Patient Active Problem List   Diagnosis Date Noted  . Myasthenia gravis affecting pregnancy ([place]) 02/01/2018  . Uterine size date discrepancy pregnancy, third trimester 01/18/2018  . Rubella non-immune status, antepartum 07/16/2017  . Supervision of other normal pregnancy, antepartum 07/13/2017  . Right upper quadrant abdominal pain 11/01/2016  . Abdominal pain, right lower quadrant 11/01/2016  . Status post vacuum-assisted vaginal delivery 05/20/2015  . Myasthenia gravis ([place]) 11/24/2014    Assessment: HARVEST DEIST is a 32 y.o. G2P1001 at [redacted]w[redacted]d here for IOL for Myasthenia Gravis.   #Labor: Induction with Pitocin  #Pain: Epidural upon request, no fentanyl due to MG  #FWB: Cat I  #ID:  GBS neg  #MOF: Breast and bottle  #MOC:POPs  #Circ:  N/A   Melina Schools 02/01/2018, 9:50 AM

## 2018-02-01 NOTE — Progress Notes (Signed)
Epidural now in place. FHT Cat I with good variability and +acels. Ctx q1-3 minutes on toco. Anticipate NSVD.   Phill Myron, D.O. OB Fellow  02/01/2018, 1:59 PM

## 2018-02-01 NOTE — Anesthesia Procedure Notes (Signed)
Epidural Patient location during procedure: OB Start time: 02/01/2018 10:40 AM End time: 02/01/2018 10:45 AM  Staffing Anesthesiologist: Catalina Gravel, MD Performed: anesthesiologist   Preanesthetic Checklist Completed: patient identified, pre-op evaluation, timeout performed, IV checked, risks and benefits discussed and monitors and equipment checked  Epidural Patient position: sitting Prep: DuraPrep Patient monitoring: blood pressure and continuous pulse ox Approach: midline Location: L3-L4 Injection technique: LOR air  Needle:  Needle type: Tuohy  Needle gauge: 17 G Needle length: 9 cm Needle insertion depth: 4 cm Catheter size: 19 Gauge Catheter at skin depth: 9 cm Test dose: negative and Other (1% Lidocaine)  Additional Notes Patient identified.  Risk benefits discussed including failed block, incomplete pain control, headache, nerve damage, paralysis, blood pressure changes, nausea, vomiting, reactions to medication both toxic or allergic, and postpartum back pain.  Patient expressed understanding and wished to proceed.  All questions were answered.  Sterile technique used throughout procedure and epidural site dressed with sterile barrier dressing. No paresthesia or other complications noted. The patient did not experience any signs of intravascular injection such as tinnitus or metallic taste in mouth nor signs of intrathecal spread such as rapid motor block. Please see nursing notes for vital signs. Reason for block:procedure for pain

## 2018-02-01 NOTE — Progress Notes (Signed)
Consulted with pharmacy, received list of meds not given with myasthenia gravis, Anesthesia informed of pts allergry to fentanyl.

## 2018-02-01 NOTE — Progress Notes (Signed)
Phenyleprine given IVP 40 mcg given.

## 2018-02-01 NOTE — Progress Notes (Signed)
140 mcg phenylephine given IVP

## 2018-02-01 NOTE — Progress Notes (Signed)
LABOR PROGRESS NOTE  Robin Robertson is a 32 y.o. G2P1001 at [redacted]w[redacted]d  admitted for IOL for myasthenia gravis.   Subjective: Patient reports that epidural is working very well. No concerns.   Objective: BP (!) 98/47   Pulse (!) 54   Temp 98.1 F (36.7 C) (Oral)   Resp 18   Ht 5\' 4"  (1.626 m)   Wt 132 lb (59.9 kg)   LMP 05/04/2017   SpO2 100%   BMI 22.66 kg/m  or  Vitals:   02/01/18 1430 02/01/18 1531 02/01/18 1601 02/01/18 1622  BP:  (!) 100/50 (!) 98/47   Pulse:  (!) 59 (!) 54   Resp:      Temp: 97.9 F (36.6 C)   98.1 F (36.7 C)  TempSrc: Oral   Oral  SpO2:      Weight:      Height:       AROM with clear fluid and bloody show present.  Dilation: 7 Effacement (%): 80, 90 Cervical Position: Middle Station: Plus 1 Presentation: Vertex Exam by:: D Herr rn FHT: baseline rate 125, good varibility, +acel, nodecel Toco: q2-41min  Labs: Lab Results  Component Value Date   WBC 7.3 02/01/2018   HGB 13.0 02/01/2018   HCT 37.5 02/01/2018   MCV 92.6 02/01/2018   PLT 150 02/01/2018    Patient Active Problem List   Diagnosis Date Noted  . Myasthenia gravis affecting pregnancy ([place]) 02/01/2018  . Uterine size date discrepancy pregnancy, third trimester 01/18/2018  . Rubella non-immune status, antepartum 07/16/2017  . Supervision of other normal pregnancy, antepartum 07/13/2017  . Right upper quadrant abdominal pain 11/01/2016  . Abdominal pain, right lower quadrant 11/01/2016  . Status post vacuum-assisted vaginal delivery 05/20/2015  . Myasthenia gravis ([place]) 11/24/2014    Assessment / Plan: 32 y.o. G2P1001 at [redacted]w[redacted]d here for IOL for myasthenia gravis.   Labor: Progressing. Continue to augment with Pitocin. AROM completed with clear fluid and bloody show.  Fetal Wellbeing:  Cat I  Pain Control:  Epidural in place  Anticipated MOD:  NSVD   Phill Myron, D.O. OB Fellow  02/01/2018, 4:41 PM

## 2018-02-02 MED ORDER — IBUPROFEN 600 MG PO TABS: 600.0000 mg | ORAL_TABLET | Freq: Four times a day (QID) | ORAL | 0 refills | Status: DC

## 2018-02-02 MED ORDER — MEASLES, MUMPS & RUBELLA VAC ~~LOC~~ INJ
0.5000 mL | INJECTION | Freq: Once | SUBCUTANEOUS | Status: DC
  Filled 2018-02-02 (×2): qty 0.5

## 2018-02-02 NOTE — Discharge Instructions (Signed)
Vaginal Delivery, Care After °Refer to this sheet in the next few weeks. These instructions provide you with information about caring for yourself after vaginal delivery. Your health care provider may also give you more specific instructions. Your treatment has been planned according to current medical practices, but problems sometimes occur. Call your health care provider if you have any problems or questions. °What can I expect after the procedure? °After vaginal delivery, it is common to have: °· Some bleeding from your vagina. °· Soreness in your abdomen, your vagina, and the area of skin between your vaginal opening and your anus (perineum). °· Pelvic cramps. °· Fatigue. ° °Follow these instructions at home: °Medicines °· Take over-the-counter and prescription medicines only as told by your health care provider. °· If you were prescribed an antibiotic medicine, take it as told by your health care provider. Do not stop taking the antibiotic until it is finished. °Driving ° °· Do not drive or operate heavy machinery while taking prescription pain medicine. °· Do not drive for 24 hours if you received a sedative. °Lifestyle °· Do not drink alcohol. This is especially important if you are breastfeeding or taking medicine to relieve pain. °· Do not use tobacco products, including cigarettes, chewing tobacco, or e-cigarettes. If you need help quitting, ask your health care provider. °Eating and drinking °· Drink at least 8 eight-ounce glasses of water every day unless you are told not to by your health care provider. If you choose to breastfeed your baby, you may need to drink more water than this. °· Eat high-fiber foods every day. These foods may help prevent or relieve constipation. High-fiber foods include: °? Whole grain cereals and breads. °? Brown rice. °? Beans. °? Fresh fruits and vegetables. °Activity °· Return to your normal activities as told by your health care provider. Ask your health care provider  what activities are safe for you. °· Rest as much as possible. Try to rest or take a nap when your baby is sleeping. °· Do not lift anything that is heavier than your baby or 10 lb (4.5 kg) until your health care provider says that it is safe. °· Talk with your health care provider about when you can engage in sexual activity. This may depend on your: °? Risk of infection. °? Rate of healing. °? Comfort and desire to engage in sexual activity. °Vaginal Care °· If you have an episiotomy or a vaginal tear, check the area every day for signs of infection. Check for: °? More redness, swelling, or pain. °? More fluid or blood. °? Warmth. °? Pus or a bad smell. °· Do not use tampons or douches until your health care provider says this is safe. °· Watch for any blood clots that may pass from your vagina. These may look like clumps of dark red, brown, or black discharge. °General instructions °· Keep your perineum clean and dry as told by your health care provider. °· Wear loose, comfortable clothing. °· Wipe from front to back when you use the toilet. °· Ask your health care provider if you can shower or take a bath. If you had an episiotomy or a perineal tear during labor and delivery, your health care provider may tell you not to take baths for a certain length of time. °· Wear a bra that supports your breasts and fits you well. °· If possible, have someone help you with household activities and help care for your baby for at least a few days after   you leave the hospital. °· Keep all follow-up visits for you and your baby as told by your health care provider. This is important. °Contact a health care provider if: °· You have: °? Vaginal discharge that has a bad smell. °? Difficulty urinating. °? Pain when urinating. °? A sudden increase or decrease in the frequency of your bowel movements. °? More redness, swelling, or pain around your episiotomy or vaginal tear. °? More fluid or blood coming from your episiotomy or  vaginal tear. °? Pus or a bad smell coming from your episiotomy or vaginal tear. °? A fever. °? A rash. °? Little or no interest in activities you used to enjoy. °? Questions about caring for yourself or your baby. °· Your episiotomy or vaginal tear feels warm to the touch. °· Your episiotomy or vaginal tear is separating or does not appear to be healing. °· Your breasts are painful, hard, or turn red. °· You feel unusually sad or worried. °· You feel nauseous or you vomit. °· You pass large blood clots from your vagina. If you pass a blood clot from your vagina, save it to show to your health care provider. Do not flush blood clots down the toilet without having your health care provider look at them. °· You urinate more than usual. °· You are dizzy or light-headed. °· You have not breastfed at all and you have not had a menstrual period for 12 weeks after delivery. °· You have stopped breastfeeding and you have not had a menstrual period for 12 weeks after you stopped breastfeeding. °Get help right away if: °· You have: °? Pain that does not go away or does not get better with medicine. °? Chest pain. °? Difficulty breathing. °? Blurred vision or spots in your vision. °? Thoughts about hurting yourself or your baby. °· You develop pain in your abdomen or in one of your legs. °· You develop a severe headache. °· You faint. °· You bleed from your vagina so much that you fill two sanitary pads in one hour. °This information is not intended to replace advice given to you by your health care provider. Make sure you discuss any questions you have with your health care provider. °Document Released: 06/16/2000 Document Revised: 12/01/2015 Document Reviewed: 07/04/2015 °Elsevier Interactive Patient Education © 2018 [employer] ° °

## 2018-02-02 NOTE — Anesthesia Postprocedure Evaluation (Signed)
Anesthesia Post Note  Patient: Robin Robertson  Procedure(s) Performed: AN AD HOC LABOR EPIDURAL     Patient location during evaluation: Mother Baby Anesthesia Type: Epidural Level of consciousness: awake and alert and oriented Pain management: satisfactory to patient Vital Signs Assessment: post-procedure vital signs reviewed and stable Respiratory status: spontaneous breathing and nonlabored ventilation Cardiovascular status: stable Postop Assessment: no headache, no backache, no signs of nausea or vomiting, adequate PO intake and patient able to bend at knees (patient up walking) Anesthetic complications: no    Last Vitals:  Vitals:   02/02/18 0154 02/02/18 0557  BP: (!) 110/52 (!) 107/53  Pulse: (!) 56 (!) 55  Resp: 18 18  Temp: 36.9 C 36.8 C  SpO2: 99% 98%    Last Pain:  Vitals:   02/02/18 0557  TempSrc: Oral  PainSc:    Pain Goal:                 Willa Rough

## 2018-02-02 NOTE — Discharge Summary (Signed)
OB Discharge Summary     Patient Name: Robin Robertson DOB: 1986/03/18 MRN: [id number]  Date of admission: 02/01/2018 Delivering MD: Nicolette Bang   Date of discharge: 02/02/2018  Admitting diagnosis: INDUCTION Intrauterine pregnancy: [redacted]w[redacted]d     Secondary diagnosis:  Active Problems:   Myasthenia gravis affecting pregnancy ([place])   Spontaneous vaginal delivery  Additional problems: none     Discharge diagnosis: Term Pregnancy Delivered                                                                                                Post partum procedures:none  Augmentation: AROM and Pitocin  Complications: None  Hospital course:  Induction of Labor With Vaginal Delivery   32 y.o. yo G2P1001 at [redacted]w[redacted]d was admitted to the hospital 02/01/2018 for induction of labor.  Indication for induction: myasthenia gravis .  Patient had an uncomplicated labor course as follows: Membrane Rupture Time/Date: 4:22 PM ,02/01/2018   Intrapartum Procedures: Episiotomy: None [1]                                         Lacerations:  None [1]  Patient had delivery of a Viable infant.  Information for the patient's newborn:  Praise, Stennett Girl Tierre [[id number]]  Delivery Method: Vag-Spont   02/01/2018  Details of delivery can be found in separate delivery note.  Patient had a routine postpartum course. Patient is discharged home 02/02/18.  Physical exam  Vitals:   02/01/18 2100 02/01/18 2155 02/02/18 0154 02/02/18 0557  BP: (!) 101/57 (!) 109/59 (!) 110/52 (!) 107/53  Pulse: 61 60 (!) 56 (!) 55  Resp: 18 18 18 18   Temp: 98.7 F (37.1 C) 98.2 F (36.8 C) 98.4 F (36.9 C) 98.2 F (36.8 C)  TempSrc: Oral Oral Oral Oral  SpO2: 100% 100% 99% 98%  Weight:      Height:       General: alert, cooperative and no distress Lochia: appropriate Uterine Fundus: firm Incision: N/A DVT Evaluation: No evidence of DVT seen on physical exam. Labs: Lab Results  Component Value Date   WBC 7.3  02/01/2018   HGB 13.0 02/01/2018   HCT 37.5 02/01/2018   MCV 92.6 02/01/2018   PLT 150 02/01/2018   CMP Latest Ref Rng & Units 11/01/2016  Glucose 65 - 99 mg/dL 105(H)  BUN 7 - 25 mg/dL 14  Creatinine 0.50 - 1.10 mg/dL 0.65  Sodium 135 - 146 mmol/L 138  Potassium 3.5 - 5.3 mmol/L 3.9  Chloride 98 - 110 mmol/L 105  CO2 20 - 31 mmol/L 21  Calcium 8.6 - 10.2 mg/dL 8.5(L)  Total Protein 6.1 - 8.1 g/dL 6.4  Total Bilirubin 0.2 - 1.2 mg/dL 0.6  Alkaline Phos 33 - 115 U/L 44  AST 10 - 30 U/L 15  ALT 6 - 29 U/L 11    Discharge instruction: per After Visit Summary and "Baby and Me Booklet".  After visit meds:  Allergies as of 02/02/2018  Reactions   Fentanyl Anaphylaxis   Stopped breathing, heart rate dropped to 8bpm   Influenza Vaccines Shortness Of Breath   Chest pain, Left side of body went numb for 2 weeks   Vicodin [hydrocodone-acetaminophen] Rash   Anger and Black outs      Medication List    TAKE these medications   ibuprofen 600 MG tablet Commonly known as:  ADVIL,MOTRIN Take 1 tablet (600 mg total) by mouth every 6 (six) hours.   PRENATAL VITAMIN PLUS LOW IRON 27-1 MG Tabs       Diet: routine diet  Activity: Advance as tolerated. Pelvic rest for 6 weeks.   Outpatient follow up:6 weeks Follow up Appt: Future Appointments  Date Time Provider [place]  06/17/2018 11:00 AM Ward Givens, NP [facility] None   Follow up Visit:No follow-ups on file.  Postpartum contraception: Progesterone only pills  Newborn Data: Live born female  Birth Weight: 6 lb 0.7 oz (2741 g) APGAR: 9, 9  Newborn Delivery   Birth date/time:  02/01/2018 18:17:00 Delivery type:  Vaginal, Spontaneous     Baby Feeding: Breast Disposition:home with mother   02/02/2018 Marcille Buffy, CNM

## 2018-02-02 NOTE — Discharge Summary (Signed)
OB Discharge Summary     Patient Name: Robin Robertson DOB: Jan 11, 1986 MRN: [id number]  Date of admission: 02/01/2018 Delivering MD: Nicolette Bang   Date of discharge: 02/02/2018  Admitting diagnosis: INDUCTION Intrauterine pregnancy: [redacted]w[redacted]d     Secondary diagnosis:  Active Problems:   Myasthenia gravis affecting pregnancy ([place])   Spontaneous vaginal delivery  Additional problems: none     Discharge diagnosis: Term Pregnancy Delivered                                                                                                Post partum procedures:n/a  Augmentation: AROM and Pitocin  Complications: None  Hospital course:  Onset of Labor With Vaginal Delivery     32 y.o. yo G2P1001 at [redacted]w[redacted]d was admitted in Active Labor on 02/01/2018. Patient had an uncomplicated labor course as follows:  Membrane Rupture Time/Date: 4:22 PM ,02/01/2018   Intrapartum Procedures: Episiotomy: None [1]                                         Lacerations:  None [1]  Patient had a delivery of a Viable infant. 02/01/2018  Information for the patient's newborn:  Makyiah, Lie Girl Lova [[id number]]  Delivery Method: Vag-Spont    Pateint had an uncomplicated postpartum course.  She is ambulating, tolerating a regular diet, passing flatus, and urinating well. Patient is discharged home in stable condition on 02/02/18.   Physical exam  Vitals:   02/01/18 2100 02/01/18 2155 02/02/18 0154 02/02/18 0557  BP: (!) 101/57 (!) 109/59 (!) 110/52 (!) 107/53  Pulse: 61 60 (!) 56 (!) 55  Resp: 18 18 18 18   Temp: 98.7 F (37.1 C) 98.2 F (36.8 C) 98.4 F (36.9 C) 98.2 F (36.8 C)  TempSrc: Oral Oral Oral Oral  SpO2: 100% 100% 99% 98%  Weight:      Height:       General: alert, cooperative and no distress Lochia: appropriate Uterine Fundus: firm Incision: N/A DVT Evaluation: No evidence of DVT seen on physical exam. Labs: Lab Results  Component Value Date   WBC 7.3 02/01/2018   HGB 13.0  02/01/2018   HCT 37.5 02/01/2018   MCV 92.6 02/01/2018   PLT 150 02/01/2018   CMP Latest Ref Rng & Units 11/01/2016  Glucose 65 - 99 mg/dL 105(H)  BUN 7 - 25 mg/dL 14  Creatinine 0.50 - 1.10 mg/dL 0.65  Sodium 135 - 146 mmol/L 138  Potassium 3.5 - 5.3 mmol/L 3.9  Chloride 98 - 110 mmol/L 105  CO2 20 - 31 mmol/L 21  Calcium 8.6 - 10.2 mg/dL 8.5(L)  Total Protein 6.1 - 8.1 g/dL 6.4  Total Bilirubin 0.2 - 1.2 mg/dL 0.6  Alkaline Phos 33 - 115 U/L 44  AST 10 - 30 U/L 15  ALT 6 - 29 U/L 11    Discharge instruction: per After Visit Summary and "Baby and Me Booklet".  After visit meds:  Allergies as of 02/02/2018  Reactions   Fentanyl Anaphylaxis   Stopped breathing, heart rate dropped to 8bpm   Influenza Vaccines Shortness Of Breath   Chest pain, Left side of body went numb for 2 weeks   Vicodin [hydrocodone-acetaminophen] Rash   Anger and Black outs      Medication List    TAKE these medications   ibuprofen 600 MG tablet Commonly known as:  ADVIL,MOTRIN Take 1 tablet (600 mg total) by mouth every 6 (six) hours.   PRENATAL VITAMIN PLUS LOW IRON 27-1 MG Tabs       Diet: routine diet  Activity: Advance as tolerated. Pelvic rest for 6 weeks.   Outpatient follow up:4 weeks Follow up Appt: Future Appointments  Date Time Provider [place]  06/17/2018 11:00 AM Ward Givens, NP [facility] None   Follow up Visit:No follow-ups on file.  Postpartum contraception: Progesterone only pills  Newborn Data: Live born female  Birth Weight: 6 lb 0.7 oz (2741 g) APGAR: 9, 9  Newborn Delivery   Birth date/time:  02/01/2018 18:17:00 Delivery type:  Vaginal, Spontaneous     Baby Feeding: Breast Disposition:home with mother   02/02/2018 Benay Pike, MD

## 2018-02-02 NOTE — Progress Notes (Signed)
CSW received consult for Robin Robertson due to history of postpartum depression. CSW met with Robin Robertson and baby at beside to have discussion. Robin Robertson reports that she experienced PPD after the birth of her first child in 2016. Robin Robertson reports that she experienced more anxiety symptoms than depression symptoms. Robin Robertson reports that she was on Paxil from 2016 through September 2018 that worked well for her. Robin Robertson reports ceasing medication due to planning this pregnancy. Robin Robertson reports having a great support system. Robin Robertson reports knowing her triggers and how to address PPD without letting it get to the point where medication is needed. CSW encouraged Robin Robertson to reach out for assistance or if any concern or question were to arise.   Madilyn Fireman, MSW, [place] Social Worker [facility] [phone]

## 2018-02-02 NOTE — Lactation Note (Signed)
This note was copied from a baby's chart. Lactation Consultation Note:  Lactation brochure given to mother with a review of basic breastfeeding.  Mother reports that she breastfed her first child for 11 months. Child is now 32 yrs old.   Infant is 52 hours old and has had 6-7 feedings with latch scores 8-10. Mother does report that her left nipple is pink and sore. Observed that skin is pealing off of her nipples. Mother was given comfort gels by staff nurse.   Assist mother with positioning infant on the alternate breast using good pillow support.observed good burst of suckling and swallows.  Mother can hand express large drops of colostrum.  Mother request her Employee pump. She was given the [employer] pump.   Advised mother to continue to do frequent STS and to cue base feed. Advised mother to continue to breastfeed 8-12 times in 24 hour.  Reviewed treatment and prevention of engorgement.  Discussed S/S of Mastitis. Mother was also given a harmony hand pump. Mother reports that she has 21 flanges at home but thinks she may need 24 flanges now.   Mother is aware of all available LC services at [facility].  Patient Name: Robin Robertson WYOVZ'C Date: 02/02/2018 Reason for consult: Follow-up assessment   Maternal Data Has patient been taught Hand Expression?: Yes Does the patient have breastfeeding experience prior to this delivery?: Yes  Feeding Feeding Type: Breast Fed Length of feed: 15 min  LATCH Score Latch: Grasps breast easily, tongue down, lips flanged, rhythmical sucking.  Audible Swallowing: Spontaneous and intermittent  Type of Nipple: Everted at rest and after stimulation  Comfort (Breast/Nipple): Filling, red/small blisters or bruises, mild/mod discomfort  Hold (Positioning): Assistance needed to correctly position infant at breast and maintain latch.  LATCH Score: 8  Interventions Interventions: Breast feeding basics reviewed;Assisted with latch;Skin to  skin;Hand express;Breast compression;Adjust position;Support pillows;Position options;Expressed milk;Comfort gels;Hand pump  Lactation Tools Discussed/Used     Consult Status Consult Status: Complete    Darla Lesches 02/02/2018, 11:40 AM

## 2018-02-11 ENCOUNTER — Encounter: Payer: Self-pay | Admitting: *Deleted

## 2018-02-18 ENCOUNTER — Ambulatory Visit (INDEPENDENT_AMBULATORY_CARE_PROVIDER_SITE_OTHER): Payer: 59 | Admitting: Advanced Practice Midwife

## 2018-02-18 VITALS — BP 97/64 | HR 79 | Resp 16 | Ht 64.0 in | Wt 119.0 lb

## 2018-02-18 DIAGNOSIS — F53 Postpartum depression: Secondary | ICD-10-CM

## 2018-02-18 DIAGNOSIS — O99345 Other mental disorders complicating the puerperium: Secondary | ICD-10-CM

## 2018-02-18 MED ORDER — PAROXETINE HCL 20 MG PO TABS: 20.0000 mg | ORAL_TABLET | Freq: Every day | ORAL | 2 refills | Status: DC

## 2018-02-18 NOTE — Patient Instructions (Signed)

## 2018-02-18 NOTE — Progress Notes (Signed)
S: 32 y.o. G2P1001 presents 3 weeks PP following NSVD with symptoms of depression/anxiety. She has hx of PP anxiety following her last baby and took Paxil which helped. She reports that with her last baby she had anxiety, nightmares, and negative thoughts. This time she reports she feels overwhelmed and is either crying or yelling all the time. She does not feel like herself.  She came in because she felt like things were getting too much for her to handle and she knew she needed help. She denies any thoughts of harm to herself or others.  She is breastfeeding and her baby is doing well. There are no symptoms and she has not tried any treatments.  O: BP 97/64   Pulse 79   Resp 16   Ht 5\' 4"  (1.626 m)   Wt 54 kg   Breastfeeding? Yes   BMI 20.43 kg/m    VS reviewed, nursing note reviewed,  Constitutional: well developed, well nourished, no distress HEENT: normocephalic CV: normal rate Pulm/chest wall: normal effort Abdomen: soft Neuro: alert and oriented x 3 Skin: warm, dry Psych: affect normal  A/P: 1. Postpartum depression --Recommend counseling, offered [place] [facility] at [facility] office, [place] here in [facility], and/or other outpatient options like [facility] [place]. Pt will contact [facility] to set up appointment. --Pt denies any thoughts of harm to herself or others today but reviewed reasons to seek emergency care.  Hotline numbers given or pt to come to MAU or ED if in crisis. --Recommend daily walk/light exercise, time in sun daily.  She reports her husband build a swingset for her older child and she is spending time outside recently which has been good. - PARoxetine (PAXIL) 20 MG tablet; Take 1 tablet (20 mg total) by mouth daily.  Dispense: 30 tablet; Refill: 2 --F/U as scheduled in 10 days for PP visit in office.     Fatima Blank, CNM 11:34 AM

## 2018-03-01 ENCOUNTER — Encounter: Payer: Self-pay | Admitting: Advanced Practice Midwife

## 2018-03-01 ENCOUNTER — Ambulatory Visit (INDEPENDENT_AMBULATORY_CARE_PROVIDER_SITE_OTHER): Payer: 59 | Admitting: Advanced Practice Midwife

## 2018-03-01 VITALS — BP 109/71 | HR 71

## 2018-03-01 DIAGNOSIS — Z30011 Encounter for initial prescription of contraceptive pills: Secondary | ICD-10-CM

## 2018-03-01 DIAGNOSIS — F53 Postpartum depression: Secondary | ICD-10-CM

## 2018-03-01 DIAGNOSIS — O99345 Other mental disorders complicating the puerperium: Secondary | ICD-10-CM

## 2018-03-01 MED ORDER — NORETHINDRONE 0.35 MG PO TABS: 1.0000 | ORAL_TABLET | Freq: Every day | ORAL | 12 refills | Status: DC

## 2018-03-01 NOTE — Progress Notes (Signed)
Post Partum Exam  Robin Robertson is a 32 y.o. G24P1001 female who presents for a postpartum visit. She is 4 weeks postpartum following a spontaneous vaginal delivery. I have fully reviewed the prenatal and intrapartum course. The delivery was at 84 gestational weeks.  Anesthesia: epidural. Postpartum course has been overwhelming but, better now since starting Paxil. Baby's course has been unremarkable. Baby is feeding by both breast and bottle - Similac Advance. Bleeding staining only. Bowel function is normal. Bladder function is normal. Patient is not sexually active. Contraception method is would like to start oral progesterone-only contraceptive. Postpartum depression screening:neg (score 8)  The following portions of the patient's history were reviewed and updated as appropriate: allergies, current medications, past family history, past medical history, past social history, past surgical history and problem list. Last pap smear done 2019 and was Normal  Review of Systems Pertinent items are noted in HPI.    Objective:  Blood pressure 109/71, pulse 71, currently breastfeeding.  General:  alert, cooperative, appears stated age, no distress and normal mood   Breasts:  inspection negative, no nipple discharge or bleeding, no masses or nodularity palpable and declined  Lungs: normal rate and effort  Heart:  regular rate   Abdomen: declined   Vulva:  not evaluated  Vagina: not evaluated        Assessment:    Normal postpartum exam. Pap smear not done at today's visit.  Post-partum depression stable on Paxil  Plan:   1. Contraception: oral progesterone-only contraceptive 2. Discussed lifestyle changes to help w/ PPD 3. Follow up in: 2 months or as needed for PPD check. Discussed that we can manage PPD, but not long-term depression.  Tamala Julian, [place], [place] 03/01/2018 11:20 AM

## 2018-03-03 NOTE — Patient Instructions (Signed)
 Contraception Choices Contraception, also called birth control, refers to methods or devices that prevent pregnancy. Hormonal methods Contraceptive implant A contraceptive implant is a thin, plastic tube that contains a hormone. It is inserted into the upper part of the arm. It can remain in place for up to 3 years. Progestin-only injections Progestin-only injections are injections of progestin, a synthetic form of the hormone progesterone. They are given every 3 months by a health care provider. Birth control pills Birth control pills are pills that contain hormones that prevent pregnancy. They must be taken once a day, preferably at the same time each day. Birth control patch The birth control patch contains hormones that prevent pregnancy. It is placed on the skin and must be changed once a week for three weeks and removed on the fourth week. A prescription is needed to use this method of contraception. Vaginal ring A vaginal ring contains hormones that prevent pregnancy. It is placed in the vagina for three weeks and removed on the fourth week. After that, the process is repeated with a new ring. A prescription is needed to use this method of contraception. Emergency contraceptive Emergency contraceptives prevent pregnancy after unprotected sex. They come in pill form and can be taken up to 5 days after sex. They work best the sooner they are taken after having sex. Most emergency contraceptives are available without a prescription. This method should not be used as your only form of birth control. Barrier methods Female condom A female condom is a thin sheath that is worn over the penis during sex. Condoms keep sperm from going inside a woman's body. They can be used with a spermicide to increase their effectiveness. They should be disposed after a single use. Female condom A female condom is a soft, loose-fitting sheath that is put into the vagina before sex. The condom keeps sperm from  going inside a woman's body. They should be disposed after a single use. Diaphragm A diaphragm is a soft, dome-shaped barrier. It is inserted into the vagina before sex, along with a spermicide. The diaphragm blocks sperm from entering the uterus, and the spermicide kills sperm. A diaphragm should be left in the vagina for 6-8 hours after sex and removed within 24 hours. A diaphragm is prescribed and fitted by a health care provider. A diaphragm should be replaced every 1-2 years, after giving birth, after gaining more than 15 lb (6.8 kg), and after pelvic surgery. Cervical cap A cervical cap is a round, soft latex or plastic cup that fits over the cervix. It is inserted into the vagina before sex, along with spermicide. It blocks sperm from entering the uterus. The cap should be left in place for 6-8 hours after sex and removed within 48 hours. A cervical cap must be prescribed and fitted by a health care provider. It should be replaced every 2 years. Sponge A sponge is a soft, circular piece of polyurethane foam with spermicide on it. The sponge helps block sperm from entering the uterus, and the spermicide kills sperm. To use it, you make it wet and then insert it into the vagina. It should be inserted before sex, left in for at least 6 hours after sex, and removed and thrown away within 30 hours. Spermicides Spermicides are chemicals that kill or block sperm from entering the cervix and uterus. They can come as a cream, jelly, suppository, foam, or tablet. A spermicide should be inserted into the vagina with an applicator at least 10-15 minutes   sex to allow time for it to work. The process must be repeated every time you have sex. Spermicides do not require a prescription. Intrauterine contraception Intrauterine device (IUD) An IUD is a T-shaped device that is put in a woman's uterus. There are two types:  Hormone IUD.This type contains progestin, a synthetic form of the hormone progesterone. This  type can stay in place for 3-5 years.  Copper IUD.This type is wrapped in copper wire. It can stay in place for 10 years.  Permanent methods of contraception Female tubal ligation In this method, a woman's fallopian tubes are sealed, tied, or blocked during surgery to prevent eggs from traveling to the uterus. Hysteroscopic sterilization In this method, a small, flexible insert is placed into each fallopian tube. The inserts cause scar tissue to form in the fallopian tubes and block them, so sperm cannot reach an egg. The procedure takes about 3 months to be effective. Another form of birth control must be used during those 3 months. Female sterilization This is a procedure to tie off the tubes that carry sperm (vasectomy). After the procedure, the man can still ejaculate fluid (semen). Natural planning methods Natural family planning In this method, a couple does not have sex on days when the woman could become pregnant. Calendar method This means keeping track of the length of each menstrual cycle, identifying the days when pregnancy can happen, and not having sex on those days. Ovulation method In this method, a couple avoids sex during ovulation. Symptothermal method This method involves not having sex during ovulation. The woman typically checks for ovulation by watching changes in her temperature and in the consistency of cervical mucus. Post-ovulation method In this method, a couple waits to have sex until after ovulation. Summary  Contraception, also called birth control, means methods or devices that prevent pregnancy.  Hormonal methods of contraception include implants, injections, pills, patches, vaginal rings, and emergency contraceptives.  Barrier methods of contraception can include female condoms, female condoms, diaphragms, cervical caps, sponges, and spermicides.  There are two types of IUDs (intrauterine devices). An IUD can be put in a woman's uterus to prevent pregnancy  for 3-5 years.  Permanent sterilization can be done through a procedure for males, females, or both.  Natural family planning methods involve not having sex on days when the woman could become pregnant. This information is not intended to replace advice given to you by your health care provider. Make sure you discuss any questions you have with your health care provider. Document Released: 06/19/2005 Document Revised: 07/22/2016 Document Reviewed: 07/22/2016 Elsevier Interactive Patient Education  2018 [employer].

## 2018-03-14 MED FILL — NORETHINDRONE 0.35 MG TAB: 0.35 | 28 days supply | Qty: 28 | Fill #0

## 2018-03-19 ENCOUNTER — Other Ambulatory Visit: Payer: Self-pay | Admitting: Advanced Practice Midwife

## 2018-03-19 DIAGNOSIS — O99345 Other mental disorders complicating the puerperium: Principal | ICD-10-CM

## 2018-03-19 DIAGNOSIS — F53 Postpartum depression: Secondary | ICD-10-CM

## 2018-04-05 ENCOUNTER — Telehealth: Payer: Self-pay | Admitting: *Deleted

## 2018-04-05 NOTE — Telephone Encounter (Signed)
Left patient a message to call and schedule PP F/U appt for Tuesday, 05/07/18 or Friday, 05/10/18 is what she stated would work for her during [place] on 03/01/18.

## 2018-04-10 MED FILL — NORETHINDRONE 0.35 MG TAB: 0.35 | 84 days supply | Qty: 84 | Fill #1

## 2018-04-12 DIAGNOSIS — Z135 Encounter for screening for eye and ear disorders: Secondary | ICD-10-CM | POA: Diagnosis not present

## 2018-04-12 DIAGNOSIS — H5213 Myopia, bilateral: Secondary | ICD-10-CM | POA: Diagnosis not present

## 2018-04-12 DIAGNOSIS — H33323 Round hole, bilateral: Secondary | ICD-10-CM | POA: Diagnosis not present

## 2018-05-01 ENCOUNTER — Telehealth: Payer: Self-pay | Admitting: *Deleted

## 2018-05-01 NOTE — Telephone Encounter (Signed)
Left 2nd message for patient to call and schedule F/U appt. For Nov.

## 2018-05-09 ENCOUNTER — Encounter: Payer: Self-pay | Admitting: Osteopathic Medicine

## 2018-06-14 ENCOUNTER — Encounter: Payer: Self-pay | Admitting: Osteopathic Medicine

## 2018-06-14 ENCOUNTER — Ambulatory Visit (INDEPENDENT_AMBULATORY_CARE_PROVIDER_SITE_OTHER): Payer: 59 | Admitting: Osteopathic Medicine

## 2018-06-14 VITALS — BP 134/69 | HR 77 | Temp 97.6°F | Wt 117.4 lb

## 2018-06-14 DIAGNOSIS — B9789 Other viral agents as the cause of diseases classified elsewhere: Secondary | ICD-10-CM | POA: Diagnosis not present

## 2018-06-14 DIAGNOSIS — J329 Chronic sinusitis, unspecified: Secondary | ICD-10-CM | POA: Diagnosis not present

## 2018-06-14 DIAGNOSIS — O99345 Other mental disorders complicating the puerperium: Secondary | ICD-10-CM

## 2018-06-14 DIAGNOSIS — F53 Postpartum depression: Secondary | ICD-10-CM

## 2018-06-14 DIAGNOSIS — R5383 Other fatigue: Secondary | ICD-10-CM

## 2018-06-14 LAB — COMPLETE METABOLIC PANEL WITH GFR
AG Ratio: 2.2 (calc) (ref 1.0–2.5)
ALBUMIN MSPROF: 4.8 g/dL (ref 3.6–5.1)
ALKALINE PHOSPHATASE (APISO): 53 U/L (ref 33–115)
ALT: 19 U/L (ref 6–29)
AST: 19 U/L (ref 10–30)
BUN: 14 mg/dL (ref 7–25)
CO2: 26 mmol/L (ref 20–32)
CREATININE: 0.75 mg/dL (ref 0.50–1.10)
Calcium: 9.8 mg/dL (ref 8.6–10.2)
Chloride: 102 mmol/L (ref 98–110)
GFR, Est African American: 122 mL/min/{1.73_m2} (ref >=60)
GFR, Est Non African American: 105 mL/min/{1.73_m2} (ref >=60)
GLUCOSE: 76 mg/dL (ref 65–99)
Globulin: 2.2 g/dL (calc) (ref 1.9–3.7)
Potassium: 4 mmol/L (ref 3.5–5.3)
Sodium: 139 mmol/L (ref 135–146)
Total Bilirubin: 0.7 mg/dL (ref 0.2–1.2)
Total Protein: 7 g/dL (ref 6.1–8.1)

## 2018-06-14 LAB — CBC
HCT: 39.4 % (ref 35.0–45.0)
HEMOGLOBIN: 13.2 g/dL (ref 11.7–15.5)
MCH: 30.2 pg (ref 27.0–33.0)
MCHC: 33.5 g/dL (ref 32.0–36.0)
MCV: 90.2 fL (ref 80.0–100.0)
MPV: 10.6 fL (ref 7.5–12.5)
PLATELETS: 261 10*3/uL (ref 140–400)
RBC: 4.37 10*6/uL (ref 3.80–5.10)
RDW: 12.1 % (ref 11.0–15.0)
WBC: 10.4 10*3/uL (ref 3.8–10.8)

## 2018-06-14 LAB — T4, FREE: Free T4: 1.1 ng/dL (ref 0.8–1.8)

## 2018-06-14 LAB — TSH: TSH: 0.6 m[IU]/L

## 2018-06-14 MED ORDER — PAROXETINE HCL 20 MG PO TABS: 20.0000 mg | ORAL_TABLET | Freq: Every day | ORAL | 1 refills | Status: DC

## 2018-06-14 NOTE — Patient Instructions (Signed)
[email or web address] - resource for medications ok to take when breastfeeding.   There is not always great data on what is safe to take, but the following medications are likely safe according to available data: Acetaminophen (Tylenol) 500 mg tablets - take max 2 tablets (1000 mg) every 6 hours (4 times per day)  Ibuprofen (Motrin) 200 mg tablets - take max 4 tablets (800 mg) every 6 hours Nasal Saline if desired to rinse the nasal passages Phenylephrine (Sudafed) may reduce breast milk production and is not advised  Diphenhydramine (Benadryl) 25 mg tablets - lower doses in breastfeeding, take max 1 tablets every 4 hours Oral Lidocaine prescriptions for sore throat Dextromethorphan (Robitussin, others) - cough suppressant is fine Guaifenesin (Robitussin, Mucinex, others) - expectorant (helps cough up mucus) is fine (Dextromethorphan and Guaifenesin also come in a combination tablet/syrup) Lozenges w/ Benzocaine + Menthol (Cepacol) Honey - as much as you want! Teas which "coat the throat" - look for ingredients Elm Bark, Licorice Root, Marshmallow Root Don't waste your money on Vitamin C or Echinacea, these do not work in acute illness

## 2018-06-14 NOTE — Progress Notes (Signed)
HPI: Robin Robertson is a 32 y.o. female who  has a past medical history of Headache, Myasthenia gravis ([place]), Myasthenia gravis ([place]), and Post partum depression.  she presents to [facility] today, 06/14/18,  for chief complaint of:  Follow up postpartum depression  Sinus problem   Doing better on Paxil, still some irritability. Sleeping ok all things considered. Noting more fatigue these past few weeks, nothing else out of th eordinary.   Sinus congestion x1 day, thinks daughter brought home something from daycare, wants to know safe meds for breastfeeding.      At today's visit... Past medical history, surgical history, and family history reviewed and updated as needed.  Current medication list and allergy/intolerance information reviewed and updated as needed. (See remainder of HPI, ROS, Phys Exam below)        ASSESSMENT/PLAN: The primary encounter diagnosis was Postpartum depression. Diagnoses of Viral sinusitis and Fatigue, unspecified type were also pertinent to this visit.  Doing well on Paxil, ok to continue current dose Labs for fatigue   Orders Placed This Encounter  Procedures  . CBC  . COMPLETE METABOLIC PANEL WITH GFR  . TSH  . T4, free     Meds ordered this encounter  Medications  . PARoxetine (PAXIL) 20 MG tablet    Sig: Take 1 tablet (20 mg total) by mouth daily.    Dispense:  90 tablet    Refill:  1    Patient Instructions  [email or web address] - resource for medications ok to take when breastfeeding.   There is not always great data on what is safe to take, but the following medications are likely safe according to available data: Acetaminophen (Tylenol) 500 mg tablets - take max 2 tablets (1000 mg) every 6 hours (4 times per day)  Ibuprofen (Motrin) 200 mg tablets - take max 4 tablets (800 mg) every 6 hours Nasal Saline if desired to rinse the nasal passages Phenylephrine  (Sudafed) may reduce breast milk production and is not advised  Diphenhydramine (Benadryl) 25 mg tablets - lower doses in breastfeeding, take max 1 tablets every 4 hours Oral Lidocaine prescriptions for sore throat Dextromethorphan (Robitussin, others) - cough suppressant is fine Guaifenesin (Robitussin, Mucinex, others) - expectorant (helps cough up mucus) is fine (Dextromethorphan and Guaifenesin also come in a combination tablet/syrup) Lozenges w/ Benzocaine + Menthol (Cepacol) Honey - as much as you want! Teas which "coat the throat" - look for ingredients Elm Bark, Licorice Root, Marshmallow Root Don't waste your money on Vitamin C or Echinacea, these do not work in acute illness       Follow-up plan: Return in about 6 months (around 12/14/2018) for [place], sooner if needed .                             ############################################ ############################################ ############################################ ############################################    Current Meds  Medication Sig  . ibuprofen (ADVIL,MOTRIN) 600 MG tablet Take 1 tablet (600 mg total) by mouth every 6 (six) hours.  . norethindrone (MICRONOR,CAMILA,ERRIN) 0.35 MG tablet Take 1 tablet (0.35 mg total) by mouth daily.  Marland Kitchen PARoxetine (PAXIL) 20 MG tablet TAKE 1 TABLET BY MOUTH EVERY DAY  . Prenatal Vit-Fe Fumarate-FA (PRENATAL VITAMIN PLUS LOW IRON) 27-1 MG TABS     Allergies  Allergen Reactions  . Fentanyl Anaphylaxis    Stopped breathing, heart rate dropped to 8bpm  . Influenza Vaccines Shortness  Of Breath    Chest pain, Left side of body went numb for 2 weeks  . Vicodin [Hydrocodone-Acetaminophen] Rash    Anger and Black outs       Review of Systems:  Constitutional: +recent illness  HEENT: + sinus headache, no vision change  Cardiac: No  chest pain, No  pressure, No palpitations  Respiratory:  No   shortness of breath. +  Cough  Gastrointestinal: No  abdominal pain, no change on bowel habits  Musculoskeletal: No new myalgia/arthralgia  Skin: No  Rash  Hem/Onc: No  easy bruising/bleeding, No  abnormal lumps/bumps  Neurologic: No  weakness, No  Dizziness  Psychiatric: +concerns with depression, No  concerns with anxiety  Exam:  BP 134/69 (BP Location: Left Arm, Patient Position: Sitting, Cuff Size: Normal)   Pulse 77   Temp 97.6 F (36.4 C) (Oral)   Wt 117 lb 6.4 oz (53.3 kg)   BMI 20.15 kg/m   Constitutional: VS see above. General Appearance: alert, well-developed, well-nourished, NAD  Eyes: Normal lids and conjunctive, non-icteric sclera  Ears, Nose, Mouth, Throat: MMM, Normal external inspection ears/nares/mouth/lips/gums. Normal pharynx.   Neck: No masses, trachea midline. No lymphadenopathy  Respiratory: Normal respiratory effort. no wheeze, no rhonchi, no rales  Cardiovascular: S1/S2 normal, no murmur, no rub/gallop auscultated. RRR.   Musculoskeletal: Gait normal. Symmetric and independent movement of all extremities  Neurological: Normal balance/coordination. No tremor.  Skin: warm, dry, intact.   Psychiatric: Normal judgment/insight. Normal mood and affect. Oriented x3.       Visit summary with medication list and pertinent instructions was printed for patient to review, patient was advised to alert [place] if any updates are needed. All questions at time of visit were answered - patient instructed to contact office with any additional concerns. ER/RTC precautions were reviewed with the patient and understanding verbalized.      Please note: voice recognition software was used to produce this document, and typos may escape review. Please contact Dr. Sheppard Coil for any needed clarifications.    Follow up plan: Return in about 6 months (around 12/14/2018) for [employer], sooner if needed .

## 2018-06-17 ENCOUNTER — Ambulatory Visit: Payer: 59 | Admitting: Adult Health

## 2018-06-18 ENCOUNTER — Encounter: Payer: Self-pay | Admitting: Osteopathic Medicine

## 2018-06-18 DIAGNOSIS — F53 Postpartum depression: Secondary | ICD-10-CM

## 2018-06-18 DIAGNOSIS — O99345 Other mental disorders complicating the puerperium: Principal | ICD-10-CM

## 2018-06-18 MED ORDER — PAROXETINE HCL 20 MG PO TABS
20.0000 mg | ORAL_TABLET | Freq: Every day | ORAL | 1 refills | Status: DC
Start: 2018-06-18 — End: 2018-12-12

## 2018-06-18 MED FILL — PARoxetine HCL 20 MG TABS: 20 | 90 days supply | Qty: 90 | Fill #0

## 2018-06-19 ENCOUNTER — Telehealth: Payer: 59 | Admitting: Nurse Practitioner

## 2018-06-19 DIAGNOSIS — J0100 Acute maxillary sinusitis, unspecified: Secondary | ICD-10-CM | POA: Diagnosis not present

## 2018-06-19 MED ORDER — AMOXICILLIN-POT CLAVULANATE 875-125 MG PO TABS: 1.0000 | ORAL_TABLET | Freq: Two times a day (BID) | ORAL | 0 refills | Status: DC

## 2018-06-19 MED FILL — AMOX-CLAV 875-125 MG TABLET: 875-125 | 7 days supply | Qty: 14 | Fill #0

## 2018-06-19 NOTE — Progress Notes (Signed)
We are sorry that you are not feeling well.  Here is how we plan to help!  Based on what you have shared with me it looks like you have sinusitis.  Sinusitis is inflammation and infection in the sinus cavities of the head.  Based on your presentation I believe you most likely have Acute Bacterial Sinusitis.  This is an infection caused by bacteria and is treated with antibiotics. I have prescribed Augmentin 875mg /125mg  one tablet twice daily with food, for 7 days. You may use an oral decongestant such as Mucinex D or if you have glaucoma or high blood pressure use plain Mucinex. Saline nasal spray help and can safely be used as often as needed for congestion.  If you develop worsening sinus pain, fever or notice severe headache and vision changes, or if symptoms are not better after completion of antibiotic, please schedule an appointment with a health care provider.    *I CHOSE TO TREAT WITH ANTIBIOTIC DUE  TO AUTOIMMUNE DISEASE OF MYASTHENIA GRAVIS  Sinus infections are not as easily transmitted as other respiratory infection, however we still recommend that you avoid close contact with loved ones, especially the very young and elderly.  Remember to wash your hands thoroughly throughout the day as this is the number one way to prevent the spread of infection!  Home Care:  Only take medications as instructed by your medical team.  Complete the entire course of an antibiotic.  Do not take these medications with alcohol.  A steam or ultrasonic humidifier can help congestion.  You can place a towel over your head and breathe in the steam from hot water coming from a faucet.  Avoid close contacts especially the very young and the elderly.  Cover your mouth when you cough or sneeze.  Always remember to wash your hands.  Get Help Right Away If:  You develop worsening fever or sinus pain.  You develop a severe head ache or visual changes.  Your symptoms persist after you have completed your  treatment plan.  Make sure you  Understand these instructions.  Will watch your condition.  Will get help right away if you are not doing well or get worse.  Your e-visit answers were reviewed by a board certified advanced clinical practitioner to complete your personal care plan.  Depending on the condition, your plan could have included both over the counter or prescription medications.  If there is a problem please reply  once you have received a response from your provider.  Your safety is important to [place].  If you have drug allergies check your prescription carefully.    You can use MyChart to ask questions about today's visit, request a non-urgent call back, or ask for a work or school excuse for 24 hours related to this e-Visit. If it has been greater than 24 hours you will need to follow up with your provider, or enter a new e-Visit to address those concerns.  You will get an e-mail in the next two days asking about your experience.  I hope that your e-visit has been valuable and will speed your recovery. Thank you for using e-visits.

## 2018-08-05 ENCOUNTER — Ambulatory Visit: Payer: 59 | Admitting: Adult Health

## 2018-08-05 ENCOUNTER — Encounter: Payer: Self-pay | Admitting: Adult Health

## 2018-08-05 VITALS — BP 96/58 | HR 70 | Ht 64.0 in | Wt 119.0 lb

## 2018-08-05 DIAGNOSIS — F329 Major depressive disorder, single episode, unspecified: Secondary | ICD-10-CM

## 2018-08-05 DIAGNOSIS — G43009 Migraine without aura, not intractable, without status migrainosus: Secondary | ICD-10-CM

## 2018-08-05 DIAGNOSIS — F32A Depression, unspecified: Secondary | ICD-10-CM

## 2018-08-05 DIAGNOSIS — G7000 Myasthenia gravis without (acute) exacerbation: Secondary | ICD-10-CM | POA: Diagnosis not present

## 2018-08-05 MED ORDER — PYRIDOSTIGMINE BROMIDE 60 MG PO TABS: 30.0000 mg | ORAL_TABLET | Freq: Two times a day (BID) | ORAL | 2 refills | Status: DC

## 2018-08-05 MED FILL — PYRIDOSTIGMINE BR 60 MG TAB: 60 | 90 days supply | Qty: 90 | Fill #0

## 2018-08-05 NOTE — Progress Notes (Signed)
PATIENT: Robin Robertson DOB: 13-Dec-1985  REASON FOR VISIT: follow up HISTORY FROM: patient  Chief Complaint  Patient presents with  . Follow-up    Rm 5, alone  . Migraine    weather barometric changes (tolerable)  . Myasthenia Gravis    increased weakness in hands (bilateral), noted with job.       HISTORY OF PRESENT ILLNESS: Today 08/05/18 Robin Robertson is a 33 y.o. female  has a past medical history of Headache, Myasthenia gravis ([place]), Myasthenia gravis ([place]), and Post partum depression. here today for follow up. She is 6 months post partum. She is feeling well with exception of mild depression, night sweats and mild weakness noted of bilateral upper extremities when working. 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 The patient has some ptosis involving the right eye but feels this is unchanged. She denies any significant problems with double vision, chewing problems, or swallowing problems. She denies falls. She had previously taken Mestinon 60mg  BID but reports that it has been about 4 years since taking this medication.   She reports that her migraines are stable. She may have 1-2 per month that are easily treated with Aleve.   She is taking Paxil 20mg  daily for depression. She is uncertain if this is working as well as it could. She does note improvement of symptoms since starting about 6 months ago but feels she is still depressed. She has not had a recent follow up with her PCP. She denies SI/HI.   HISTORY: (copied from [place] note on 11/18/2017) Ms. Bean is a 33 year old female with a history of myasthenia gravis and migraine headaches.  She returns today for follow-up.  She is currently [redacted] weeks pregnant.  She reports that she is been doing well.  She denies any significant headaches since her first trimester.  She states that she only has shortness of breath when the baby is laying a  certain way.  She denies any worsening ptosis.  Denies any trouble chewing or swallowing.  She is currently not on any medication for myasthenia gravis or migraine headaches.  She denies any new symptoms.  She returns today for an evaluation.  REVIEW OF SYSTEMS: Out of a complete 14 system review of symptoms, the patient complains only of the following symptoms, night sweats, light sensitivity, cough, chest tightness, cold intolerance, frequent infections, daytime sleepiness, aching muscles, muscle cramps, headaches, anxious and all other reviewed systems are negative.  ALLERGIES: Allergies  Allergen Reactions  . Fentanyl Anaphylaxis    Stopped breathing, heart rate dropped to 8bpm  . Influenza Vaccines Shortness Of Breath    Chest pain, Left side of body went numb for 2 weeks  . Vicodin [Hydrocodone-Acetaminophen] Rash    Anger and Black outs    HOME MEDICATIONS: Outpatient Medications Prior to Visit  Medication Sig Dispense Refill  . norethindrone (MICRONOR,CAMILA,ERRIN) 0.35 MG tablet Take 1 tablet (0.35 mg total) by mouth daily. 1 Package 12  . PARoxetine (PAXIL) 20 MG tablet Take 1 tablet (20 mg total) by mouth daily. 90 tablet 1  . amoxicillin-clavulanate (AUGMENTIN) 875-125 MG tablet Take 1 tablet by mouth 2 (two) times daily. 14 tablet 0  . ibuprofen (ADVIL,MOTRIN) 600 MG tablet Take 1 tablet (600 mg total) by mouth every 6 (six) hours. 30 tablet 0  . Prenatal Vit-Fe Fumarate-FA (PRENATAL VITAMIN PLUS LOW IRON) 27-1 MG TABS  No facility-administered medications prior to visit.     PAST MEDICAL HISTORY: Past Medical History:  Diagnosis Date  . Headache   . Myasthenia gravis ([place])   . Myasthenia gravis ([place])   . Post partum depression     PAST SURGICAL HISTORY: Past Surgical History:  Procedure Laterality Date  . WISDOM TOOTH EXTRACTION      FAMILY HISTORY: Family History  Problem Relation Age of Onset  . Hypertension Father   . Cerebrovascular Disease Maternal  Grandmother   . Heart disease Paternal Grandmother   . Myasthenia gravis Paternal Grandmother   . Hypertension Paternal Grandfather     SOCIAL HISTORY: Social History   Socioeconomic History  . Marital status: Married    Spouse name: Not on file  . Number of children: 0  . Years of education: associates  . Highest education level: Not on file  Occupational History  . Occupation: [employer]  Social Needs  . Financial resource strain: Not on file  . Food insecurity:    Worry: Not on file    Inability: Not on file  . Transportation needs:    Medical: Not on file    Non-medical: Not on file  Tobacco Use  . Smoking status: Never Smoker  . Smokeless tobacco: Never Used  Substance and Sexual Activity  . Alcohol use: No    Alcohol/week: 0.0 standard drinks  . Drug use: No  . Sexual activity: Yes    Partners: Male    Birth control/protection: None  Lifestyle  . Physical activity:    Days per week: Not on file    Minutes per session: Not on file  . Stress: Not on file  Relationships  . Social connections:    Talks on phone: Not on file    Gets together: Not on file    Attends religious service: Not on file    Active member of club or organization: Not on file    Attends meetings of clubs or organizations: Not on file    Relationship status: Not on file  . Intimate partner violence:    Fear of current or ex partner: Not on file    Emotionally abused: Not on file    Physically abused: Not on file    Forced sexual activity: Not on file  Other Topics Concern  . Not on file  Social History Narrative   Patient drinks 1-3 cups of caffeine weekly.   Patient is right handed.   Has child, CHLOE.      PHYSICAL EXAM  Vitals:   08/05/18 1046  BP: (!) 96/58  Pulse: 70  SpO2: 95%  Weight: 119 lb (54 kg)  Height: 5\' 4"  (1.626 m)   Body mass index is 20.43 kg/m.  Generalized: Well developed, in no acute distress  Cardiology: normal rate and rhythm, no murmur  noted Neurological examination  Mentation: Alert oriented to time, place, history taking. Follows all commands speech and language fluent Cranial nerve II-XII: Pupils were equal round reactive to light. Extraocular movements were full, visual field were full on confrontational test. Facial sensation and strength were normal. Uvula tongue midline. Head turning and shoulder shrug  were normal and symmetric. Right ptosis, Upgaze held for 1 minute, subjective weakness reported, no worsening of ptosis noted.  Motor: The motor testing reveals 5 over 5 strength of all 4 extremities. Good symmetric motor tone is noted throughout. Abduction held of upper extremities for 1 minute, subjective heaviness reported, no decreased strength on exam.  Sensory: Sensory testing is intact to soft touch on all 4 extremities. No evidence of extinction is noted.  Coordination: Cerebellar testing reveals good finger-nose-finger and heel-to-shin bilaterally.  Gait and station: Gait is normal.    DIAGNOSTIC DATA (LABS, IMAGING, TESTING) - I reviewed patient records, labs, notes, testing and imaging myself where available.  No flowsheet data found.   Lab Results  Component Value Date   WBC 10.4 06/14/2018   HGB 13.2 06/14/2018   HCT 39.4 06/14/2018   MCV 90.2 06/14/2018   PLT 261 06/14/2018      Component Value Date/Time   NA 139 06/14/2018 1021   K 4.0 06/14/2018 1021   CL 102 06/14/2018 1021   CO2 26 06/14/2018 1021   GLUCOSE 76 06/14/2018 1021   BUN 14 06/14/2018 1021   CREATININE 0.75 06/14/2018 1021   CALCIUM 9.8 06/14/2018 1021   PROT 7.0 06/14/2018 1021   ALBUMIN 4.0 11/01/2016 0958   AST 19 06/14/2018 1021   ALT 19 06/14/2018 1021   ALKPHOS 44 11/01/2016 0958   BILITOT 0.7 06/14/2018 1021   GFRNONAA 105 06/14/2018 1021   GFRAA 122 06/14/2018 1021   No results found for: CHOL, HDL, LDLCALC, LDLDIRECT, TRIG, CHOLHDL No results found for: HGBA1C No results found for: VITAMINB12 Lab Results    Component Value Date   TSH 0.60 06/14/2018       ASSESSMENT AND PLAN 33 y.o. year old female  has a past medical history of Headache, Myasthenia gravis ([place]), Myasthenia gravis ([place]), and Post partum depression. here with     ICD-10-CM   1. Myasthenia gravis ([place]) G70.00   2. Migraine without aura and without status migrainosus, not intractable G43.009   3. Depression, unspecified depression type F32.9     She has noticed some increased fatigue in upper extremities when working.We will start Mestinon 30mg  twice daily. She will call in 1 month with progress report. Will anticipate increased dose at that time if she is tolerating well. We will continue to monitor migraines. Occasional use of Aleve discussed but limit to less than 5 times a month. She was advised to follow up with her PCP regarding depression and night sweats. She will follow up in the office in 6 months, sooner if needed.    No orders of the defined types were placed in this encounter.    Meds ordered this encounter  Medications  . pyridostigmine (MESTINON) 60 MG tablet    Sig: Take 0.5 tablets (30 mg total) by mouth 2 (two) times daily.    Dispense:  90 tablet    Refill:  2    Order Specific Question:   Supervising Provider    Answer:   Melvenia Beam [id number]      I spent 15 minutes with the patient. 50% of this time was spent counseling and educating patient on plan of care and medications.    Debbora Presto, FNP-C 08/05/2018, 11:51 AM [facility] [address], [place], [place] [address] [phone]

## 2018-08-05 NOTE — Progress Notes (Signed)
I have read the note, and I agree with the clinical assessment and plan.  Seini Lannom K Maisy [place]   

## 2018-08-05 NOTE — Patient Instructions (Addendum)
Restart mestinon at 30mg  twice daily Call in 1 month with progress report, will increase dose as needed Continue healthy diet and regular exercise.    Myasthenia Gravis Myasthenia gravis (MG) is a long-term (chronic) condition that causes weakness in the muscles you can control (voluntary muscles). MG can affect any voluntary muscle. The muscles most often affected are the ones that control:  Eye movement.  Facial movements.  Swallowing. MG is a disease in which the body's disease-fighting system (immune system) attacks its own healthy tissues (autoimmune disease). When you have MG, your immune system makes proteins (antibodies) that block the chemical (acetylcholine) that your body needs to send nerve signals to your muscles. This causes muscle weakness. What are the causes? The exact cause of MG is not known. What increases the risk? The following factors may make you more likely to develop this condition:  Having an enlarged thymus gland. The thymus gland is located under the breastbone. It makes certain cells for the immune system.  Having a family history of MG. What are the signs or symptoms? Symptoms of MG may include:  Drooping eyelids.  Double vision.  Muscle weakness that gets worse with activity and gets better after rest.  Difficulty walking.  Trouble chewing and swallowing.  Trouble making facial expressions.  Slurred speech.  Weakness of the arms, hands, and legs. Sudden, severe difficulty breathing (myasthenic crisis) may develop after having:  An infection.  A fever.  A bad reaction to a medicine. Myasthenic crisis requires emergency breathing support. Sometimes symptoms of MG go away for a while (remission) and then come back later. How is this diagnosed? This condition may be diagnosed based on:  Your symptoms and medical history.  A physical exam.  Blood tests.  Tests of your muscle strength and function.  Imaging tests, such as a CT scan  or an MRI. How is this treated? The goal of treatment is to improve muscle strength. Treatment may include:  Taking medicine.  Making lifestyle changes that focus on saving your energy.  Doing physical therapy to gain strength.  Having surgery to remove the thymus gland (thymectomy). This may result in a long remission for some people.  Having a procedure to remove the acetylcholine antibodies (plasmapheresis).  Getting emergency breathing support, if you experience myasthenic crisis. If you experience remission, you may be able to stop treatment and then resume treatment when your symptoms return. Follow these instructions at home:   Take over-the-counter and prescription medicines only as told by your health care provider.  Get plenty of rest and sleep. Take frequent breaks to rest your eyes, especially when in bright light or working on a computer.  Maintain a healthy diet and a healthy weight. Work with your health care provider or a diet and nutrition specialist (dietitian) if you need help.  Do exercises as told by your health care provider or physical therapist.  Do not use any products that contain nicotine or tobacco, such as cigarettes and e-cigarettes. If you need help quitting, ask your health care provider.  Prevent infections by: ? Washing your hands often with soap and water. If soap and water are not available, use hand sanitizer. ? Avoiding contact with other people who are sick. ? Avoiding touching your eyes, nose, and mouth. ? Cleaning surfaces in your home that are touched often using a disinfectant.  Keep all follow-up visits as told by your health care provider. This is important. Contact a health care provider if:  Your symptoms  change or get worse, especially after having a fever or infection. Get help right away if:  You have trouble breathing. Summary  Myasthenia gravis (MG) is a long-term (chronic) condition that causes weakness in the muscles  you can control (voluntary muscles).  A symptom of MG is muscle weakness that gets worse with activity and gets better after rest.  Sudden, severe difficulty breathing (myasthenic crisis) may develop after having an infection, a fever, or a bad reaction to a medicine.  The goal of treatment is to improve muscle strength. Treatment may include medicines, lifestyle changes, physical therapy, surgery, plasmapheresis, or emergency breathing support. This information is not intended to replace advice given to you by your health care provider. Make sure you discuss any questions you have with your health care provider. Document Released: 09/25/2000 Document Revised: 07/02/2017 Document Reviewed: 07/02/2017 Elsevier Interactive Patient Education  2019 [employer].

## 2018-09-13 MED FILL — PARoxetine HCL 20 MG TABS: 20 | 90 days supply | Qty: 90 | Fill #1

## 2018-09-18 ENCOUNTER — Encounter: Payer: Self-pay | Admitting: *Deleted

## 2018-09-18 ENCOUNTER — Telehealth: Payer: Self-pay | Admitting: *Deleted

## 2018-09-18 MED ORDER — NORETHIN ACE-ETH ESTRAD-FE 1-20 MG-MCG(24) PO TABS: 1.0000 | ORAL_TABLET | Freq: Every day | ORAL | 11 refills | Status: DC

## 2018-09-18 MED FILL — BLISOVI 24 FE 1-20 MG-MCG(2: 1-20 | 84 days supply | Qty: 84 | Fill #0

## 2018-09-18 NOTE — Telephone Encounter (Signed)
Pt called requesting that her OCP be changed from [facility] to what she had taken in the past because she is no longer breast feeding.  Per Dr Hulan Fray she may have Loestrin Fe 1/20 and this was sent to [facility] outpatient pharmacy.  Pt is aware.

## 2018-10-01 ENCOUNTER — Encounter: Payer: Self-pay | Admitting: Adult Health

## 2018-11-19 ENCOUNTER — Encounter: Payer: Self-pay | Admitting: Osteopathic Medicine

## 2018-12-04 MED FILL — BLISOVI 24 FE 1-20 MG-MCG(2: 1-20 | 84 days supply | Qty: 84 | Fill #1

## 2018-12-12 ENCOUNTER — Ambulatory Visit (INDEPENDENT_AMBULATORY_CARE_PROVIDER_SITE_OTHER): Payer: 59 | Admitting: Osteopathic Medicine

## 2018-12-12 ENCOUNTER — Encounter: Payer: Self-pay | Admitting: Osteopathic Medicine

## 2018-12-12 VITALS — BP 103/61 | HR 69 | Wt 121.0 lb

## 2018-12-12 DIAGNOSIS — G7000 Myasthenia gravis without (acute) exacerbation: Secondary | ICD-10-CM

## 2018-12-12 DIAGNOSIS — Z8619 Personal history of other infectious and parasitic diseases: Secondary | ICD-10-CM | POA: Diagnosis not present

## 2018-12-12 DIAGNOSIS — Z0000 Encounter for general adult medical examination without abnormal findings: Secondary | ICD-10-CM | POA: Diagnosis not present

## 2018-12-12 DIAGNOSIS — Z8659 Personal history of other mental and behavioral disorders: Secondary | ICD-10-CM

## 2018-12-12 MED ORDER — PAROXETINE HCL 20 MG PO TABS: 20.0000 mg | ORAL_TABLET | Freq: Every day | ORAL | 3 refills | Status: DC

## 2018-12-12 MED FILL — PARoxetine HCL 20 MG TABS: 20 | 90 days supply | Qty: 90 | Fill #0

## 2018-12-12 NOTE — Progress Notes (Signed)
HPI: Robin Robertson is a 33 y.o. female who  has a past medical history of Headache, Myasthenia gravis ([place]), Myasthenia gravis ([place]), and Post partum depression.  she presents to [facility] today, 12/12/18,  for chief complaint of: Annual physical     Patient here for annual physical / wellness exam.  See preventive care reviewed as below.   Additional concerns today include:  None!     Past medical, surgical, social and family history reviewed:  Patient Active Problem List   Diagnosis Date Noted  . Migraine without aura and without status migrainosus, not intractable 08/05/2018  . Depression 08/05/2018  . Fatigue 06/14/2018  . Postpartum depression 02/18/2018  . Rubella non-immune status, antepartum 07/16/2017  . Myasthenia gravis ([place]) 11/24/2014    Past Surgical History:  Procedure Laterality Date  . WISDOM TOOTH EXTRACTION      Social History   Tobacco Use  . Smoking status: Never Smoker  . Smokeless tobacco: Never Used  Substance Use Topics  . Alcohol use: No    Alcohol/week: 0.0 standard drinks    Family History  Problem Relation Age of Onset  . Hypertension Father   . Cerebrovascular Disease Maternal Grandmother   . Heart disease Paternal Grandmother   . Myasthenia gravis Paternal Grandmother   . Hypertension Paternal Grandfather      Current medication list and allergy/intolerance information reviewed:    Current Outpatient Medications  Medication Sig Dispense Refill  . Norethindrone Acetate-Ethinyl Estrad-FE (LOESTRIN 24 FE) 1-20 MG-MCG(24) tablet Take 1 tablet by mouth daily. 1 Package 11  . PARoxetine (PAXIL) 20 MG tablet Take 1 tablet (20 mg total) by mouth daily. 90 tablet 3   No current facility-administered medications for this visit.     Allergies  Allergen Reactions  . Fentanyl Anaphylaxis    Stopped breathing, heart rate dropped to 8bpm  . Influenza Vaccines Shortness Of Breath    Chest pain,  Left side of body went numb for 2 weeks  . Vicodin [Hydrocodone-Acetaminophen] Rash    Anger and Black outs      Review of Systems:  Constitutional:  No  fever, no chills, No recent illness  HEENT: No  headache, no vision change,  Cardiac: No  chest pain, No  pressure, No palpitations, No  Orthopnea  Respiratory:  No  shortness of breath. No  Cough  Gastrointestinal: No  abdominal pain, No  nausea, No  vomiting,  No  blood in stool, No  diarrhea, No  constipation   Musculoskeletal: No new myalgia/arthralgia  Skin: No  Rash, No other wounds/concerning lesions  Hem/Onc: No  easy bruising/bleeding, No  abnormal lymph node  Endocrine: No cold intolerance,  No heat intolerance.   Neurologic: No  weakness, No  dizziness, No  slurred speech/focal weakness/facial droop  Psychiatric: No  concerns with depression, No  concerns with anxiety, No sleep problems, No mood problems  Exam:  BP 103/61   Pulse 69   Wt 121 lb (54.9 kg)   SpO2 100%   BMI 20.77 kg/m   Constitutional: VS see above. General Appearance: alert, well-developed, well-nourished, NAD  Eyes: Normal lids and conjunctive, non-icteric sclera  Ears, Nose, Mouth, Throat: TM normal bilaterally.   Neck: No masses, trachea midline. No thyroid enlargement. No tenderness/mass appreciated. No lymphadenopathy  Respiratory: Normal respiratory effort. no wheeze, no rhonchi, no rales  Cardiovascular: S1/S2 normal, no murmur, no rub/gallop auscultated. RRR. No lower extremity edema.   Gastrointestinal: Nontender, no masses.  No hepatomegaly, no splenomegaly. No hernia appreciated. Bowel sounds normal. Rectal exam deferred.   Musculoskeletal: Gait normal. No clubbing/cyanosis of digits.   Neurological: Normal balance/coordination. No tremor. No cranial nerve deficit on limited exam. Motor and sensation intact and symmetric. Cerebellar reflexes intact.   Skin: warm, dry, intact. No rash/ulcer. No concerning nevi or subq  nodules on limited exam.    Psychiatric: Normal judgment/insight. Normal mood and affect. Oriented x3.      ASSESSMENT/PLAN: The primary encounter diagnosis was Annual physical exam. Diagnoses of History of depression, Myasthenia gravis ([place]), and History of viral illness were also pertinent to this visit.   Orders Placed This Encounter  Procedures  . SAR CoV2 Serology (COVID 19)AB(IGG)IA  . COMPLETE METABOLIC PANEL WITH GFR  . CMP14+EGFR  . CBC  . Lipid panel  . TSH    Meds ordered this encounter  Medications  . PARoxetine (PAXIL) 20 MG tablet    Sig: Take 1 tablet (20 mg total) by mouth daily.    Dispense:  90 tablet    Refill:  3    Patient Instructions  General Preventive Care  Most recent routine screening lipids/other labs:    Everyone should have blood pressure checked once per year.   Tobacco: don't!   Alcohol: responsible moderation is ok for most adults - if you have concerns about your alcohol intake, please talk to me!   Exercise: as tolerated to reduce risk of cardiovascular disease and diabetes. Strength training will also prevent osteoporosis.   Mental health: if need for mental health care (medicines, counseling, other), or concerns about moods, please let me know!   Sexual health: if need for STD testing, or if concerns with libido/pain problems, please let me know! If you need to discuss your birth control options, please let me know!   Advanced Directive: Living Will and/or Healthcare Power of Attorney recommended for all adults, regardless of age or health.  Vaccines  Flu vaccine: recommended for almost everyone, every fall.   Shingles vaccine: recommended after age 24.  Pneumonia vaccines: recommended after age 3  Tetanus booster: Tdap recommended every 10 years. Due 2029  HPV vaccine: Gardasil recommended up to age 59 to prevent HPV-associated diseases, including certain cancers.  Cancer screenings   Colon cancer screening: recommended  for everyone at age 33  Breast cancer screening: mammogram recommended at age 59  Cervical cancer screening: Pap every 1 to 5 years depending on age and other risk factors. Can usually stop at age 57 or w/ hysterectomy.   Lung cancer screening: not needed for non-smokers. Infection screenings . HIV, Gonorrhea/Chlamydia: screening as needed . Hepatitis C: recommended for anyone born 35-1965 . TB: certain at-risk populations, or depending on work requirements and/or travel history Other . Bone Density Test: recommended for women at age 53       Visit summary with medication list and pertinent instructions was printed for patient to review. All questions at time of visit were answered - patient instructed to contact office with any additional concerns or updates. ER/RTC precautions were reviewed with the patient.      Please note: voice recognition software was used to produce this document, and typos may escape review. Please contact Dr. Sheppard Coil for any needed clarifications.     Follow-up plan: Return in about 1 year (around 12/12/2019) for annual physical, sooner if needed.

## 2018-12-12 NOTE — Patient Instructions (Addendum)
General Preventive Care  Most recent routine screening lipids/other labs:    Everyone should have blood pressure checked once per year.   Tobacco: don't!   Alcohol: responsible moderation is ok for most adults - if you have concerns about your alcohol intake, please talk to me!   Exercise: as tolerated to reduce risk of cardiovascular disease and diabetes. Strength training will also prevent osteoporosis.   Mental health: if need for mental health care (medicines, counseling, other), or concerns about moods, please let me know!   Sexual health: if need for STD testing, or if concerns with libido/pain problems, please let me know! If you need to discuss your birth control options, please let me know!   Advanced Directive: Living Will and/or Healthcare Power of Attorney recommended for all adults, regardless of age or health.  Vaccines  Flu vaccine: recommended for almost everyone, every fall.   Shingles vaccine: recommended after age 31.  Pneumonia vaccines: recommended after age 51  Tetanus booster: Tdap recommended every 10 years. Due 2029  HPV vaccine: Gardasil recommended up to age 19 to prevent HPV-associated diseases, including certain cancers.  Cancer screenings   Colon cancer screening: recommended for everyone at age 76  Breast cancer screening: mammogram recommended at age 21  Cervical cancer screening: Pap every 1 to 5 years depending on age and other risk factors. Can usually stop at age 81 or w/ hysterectomy.   Lung cancer screening: not needed for non-smokers. Infection screenings . HIV, Gonorrhea/Chlamydia: screening as needed . Hepatitis C: recommended for anyone born [birth year]-1965 . TB: certain at-risk populations, or depending on work requirements and/or travel history Other . Bone Density Test: recommended for women at age 83

## 2018-12-13 DIAGNOSIS — Z8659 Personal history of other mental and behavioral disorders: Secondary | ICD-10-CM | POA: Diagnosis not present

## 2018-12-13 DIAGNOSIS — G7000 Myasthenia gravis without (acute) exacerbation: Secondary | ICD-10-CM | POA: Diagnosis not present

## 2018-12-13 DIAGNOSIS — Z0000 Encounter for general adult medical examination without abnormal findings: Secondary | ICD-10-CM | POA: Diagnosis not present

## 2018-12-13 DIAGNOSIS — Z8619 Personal history of other infectious and parasitic diseases: Secondary | ICD-10-CM | POA: Diagnosis not present

## 2018-12-13 LAB — LIPID PANEL

## 2018-12-14 LAB — CMP14+EGFR
ALT: 12 IU/L (ref 0–32)
AST: 19 IU/L (ref 0–40)
Albumin/Globulin Ratio: 2.1 (ref 1.2–2.2)
Albumin: 4.7 g/dL (ref 3.8–4.8)
Alkaline Phosphatase: 42 IU/L (ref 39–117)
BUN/Creatinine Ratio: 18 (ref 9–23)
BUN: 14 mg/dL (ref 6–20)
Bilirubin Total: 0.5 mg/dL (ref 0.0–1.2)
CO2: 20 mmol/L (ref 20–29)
Calcium: 9.5 mg/dL (ref 8.7–10.2)
Chloride: 104 mmol/L (ref 96–106)
Creatinine, Ser: 0.78 mg/dL (ref 0.57–1.00)
GFR calc Af Amer: 116 mL/min/{1.73_m2} (ref >59)
GFR calc non Af Amer: 100 mL/min/{1.73_m2} (ref >59)
Globulin, Total: 2.2 g/dL (ref 1.5–4.5)
Glucose: 88 mg/dL (ref 65–99)
Potassium: 4.2 mmol/L (ref 3.5–5.2)
Sodium: 140 mmol/L (ref 134–144)
Total Protein: 6.9 g/dL (ref 6.0–8.5)

## 2018-12-14 LAB — LIPID PANEL
Chol/HDL Ratio: 2.5 ratio (ref 0.0–4.4)
Cholesterol, Total: 163 mg/dL (ref 100–199)
HDL: 66 mg/dL (ref >39)
LDL Calculated: 75 mg/dL (ref 0–99)
Triglycerides: 108 mg/dL (ref 0–149)
VLDL Cholesterol Cal: 22 mg/dL (ref 5–40)

## 2018-12-14 LAB — CBC
Hematocrit: 43 % (ref 34.0–46.6)
Hemoglobin: 14.3 g/dL (ref 11.1–15.9)
MCH: 29.5 pg (ref 26.6–33.0)
MCHC: 33.3 g/dL (ref 31.5–35.7)
MCV: 89 fL (ref 79–97)
Platelets: 272 10*3/uL (ref 150–450)
RBC: 4.84 x10E6/uL (ref 3.77–5.28)
RDW: 12 % (ref 11.7–15.4)
WBC: 5.8 10*3/uL (ref 3.4–10.8)

## 2018-12-14 LAB — SAR COV2 SEROLOGY (COVID19)AB(IGG),IA: SARS-CoV-2 Ab, IgG: NEGATIVE

## 2018-12-14 LAB — TSH: TSH: 1.09 u[IU]/mL (ref 0.450–4.500)

## 2019-03-07 MED FILL — BLISOVI 24 FE 1-20 MG-MCG(2: 1-20 | 84 days supply | Qty: 84 | Fill #2

## 2019-03-12 MED FILL — PARoxetine HCL 20 MG TABS: 20 | 90 days supply | Qty: 90 | Fill #1

## 2019-04-01 ENCOUNTER — Encounter: Payer: Self-pay | Admitting: Osteopathic Medicine

## 2019-04-01 DIAGNOSIS — Z1283 Encounter for screening for malignant neoplasm of skin: Secondary | ICD-10-CM

## 2019-04-01 NOTE — Telephone Encounter (Signed)
Referral pended

## 2019-04-16 DIAGNOSIS — H33323 Round hole, bilateral: Secondary | ICD-10-CM | POA: Diagnosis not present

## 2019-04-16 DIAGNOSIS — Z135 Encounter for screening for eye and ear disorders: Secondary | ICD-10-CM | POA: Diagnosis not present

## 2019-04-16 DIAGNOSIS — H5213 Myopia, bilateral: Secondary | ICD-10-CM | POA: Diagnosis not present

## 2019-05-07 DIAGNOSIS — D225 Melanocytic nevi of trunk: Secondary | ICD-10-CM | POA: Diagnosis not present

## 2019-05-07 DIAGNOSIS — L821 Other seborrheic keratosis: Secondary | ICD-10-CM | POA: Diagnosis not present

## 2019-05-07 DIAGNOSIS — D1801 Hemangioma of skin and subcutaneous tissue: Secondary | ICD-10-CM | POA: Diagnosis not present

## 2019-05-07 DIAGNOSIS — D485 Neoplasm of uncertain behavior of skin: Secondary | ICD-10-CM | POA: Diagnosis not present

## 2019-05-20 IMAGING — US US MFM OB TRANSVAGINAL
1 series · 15 of 15 positions shown · non-contrast
Comparison: none

[Series 1: us mfm ob transvaginal · 15 acquisitions, 15 frames shown]
[im 1/15]
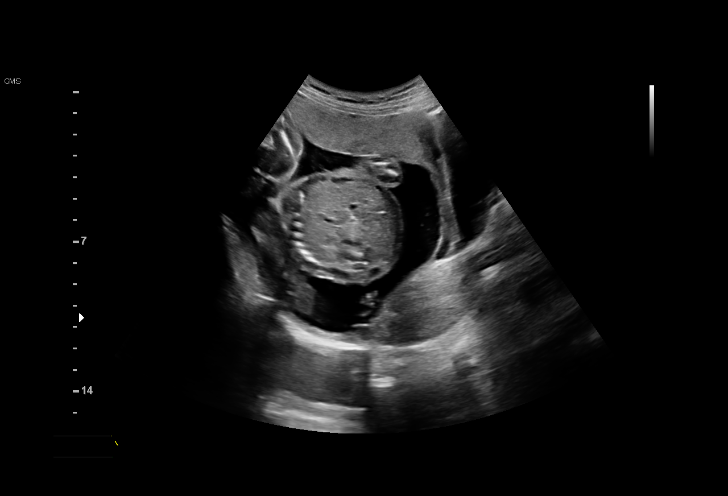
[im 2/15]
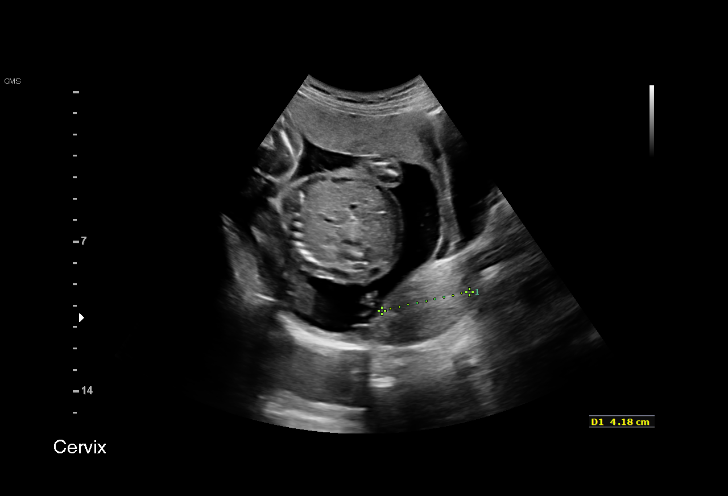
[im 3/15]
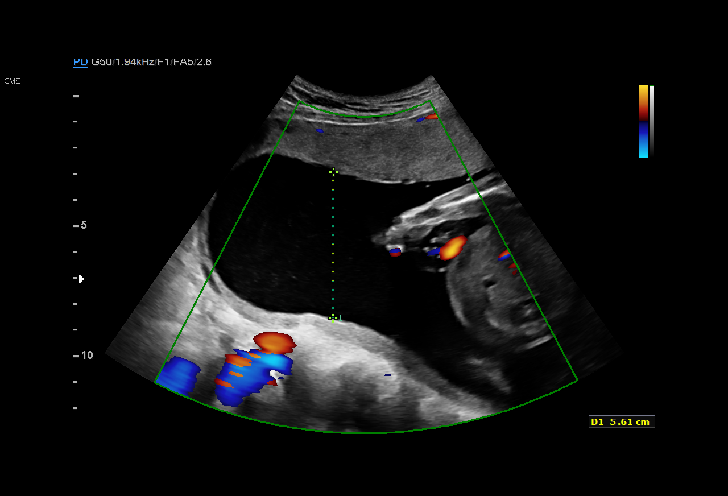
[im 4/15]
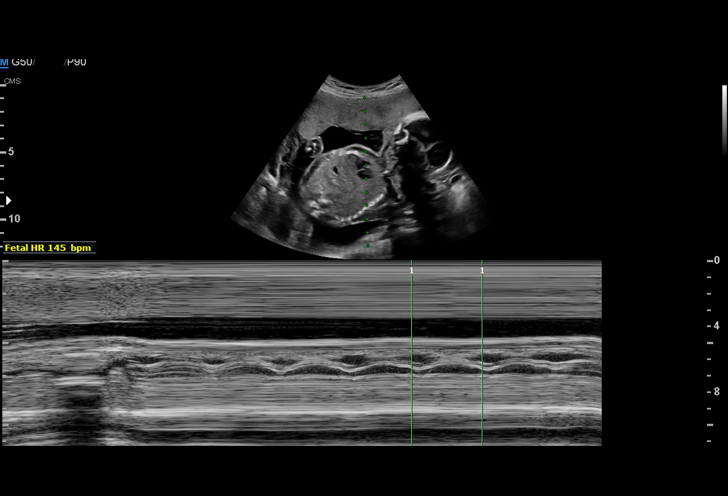
[im 5/15]
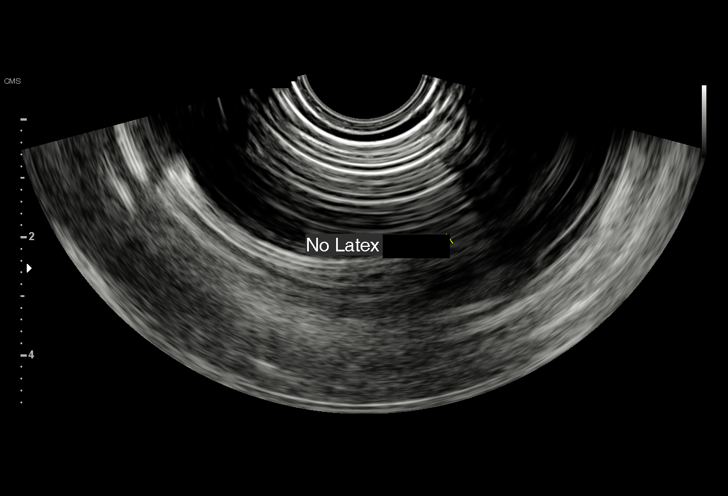
[im 6/15]
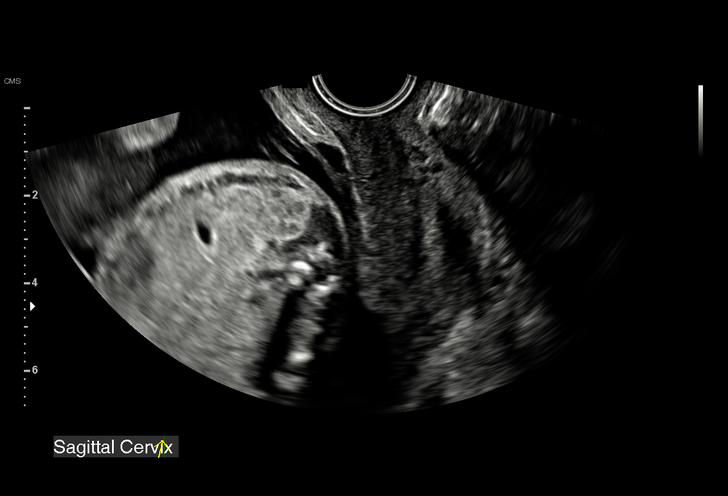
[im 7/15]
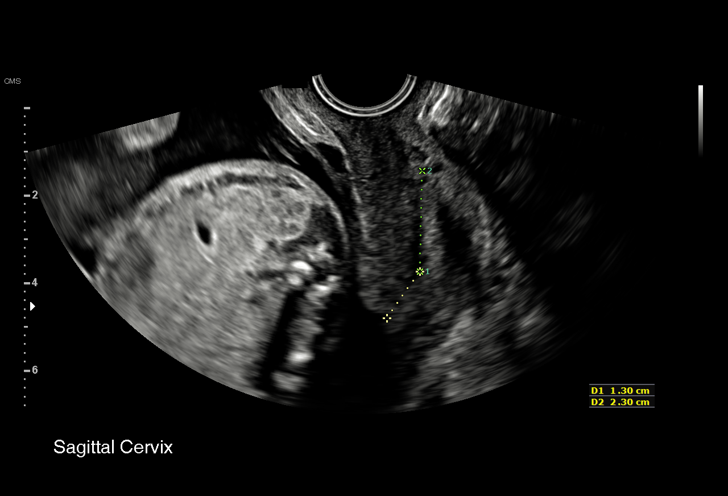
[im 8/15]
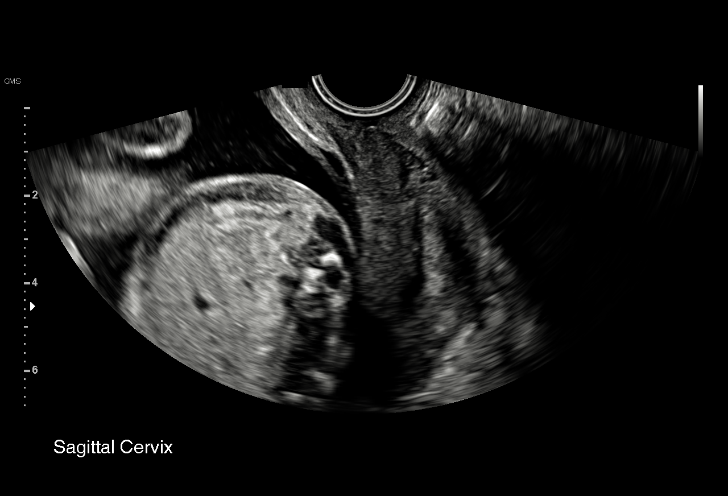
[im 9/15]
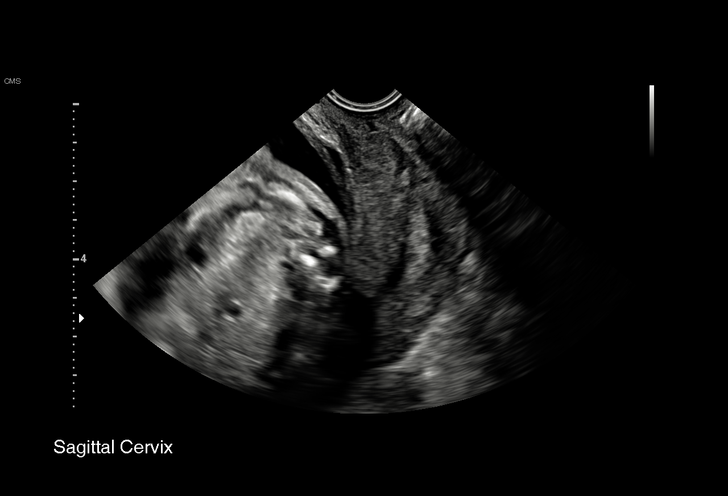
[im 10/15]
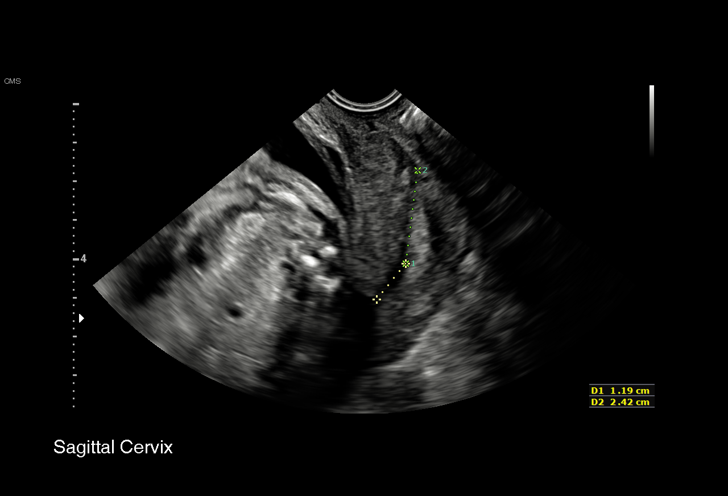
[im 11/15]
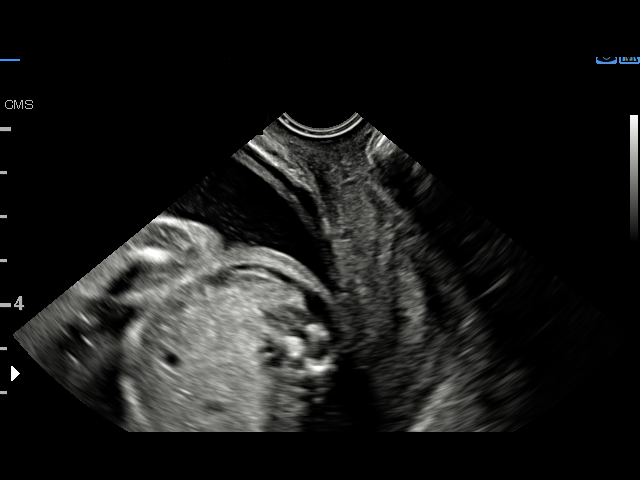
[im 12/15]
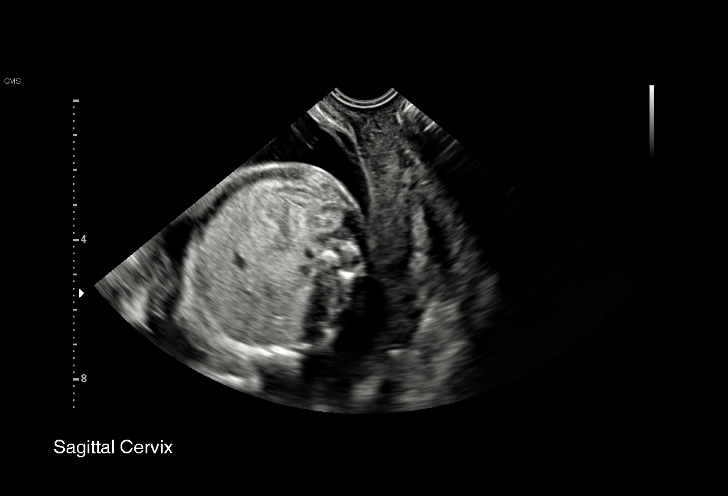
[im 13/15]
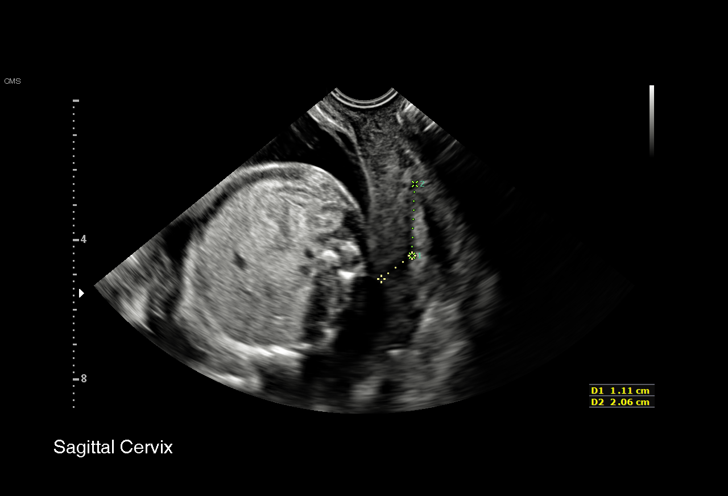
[im 14/15]
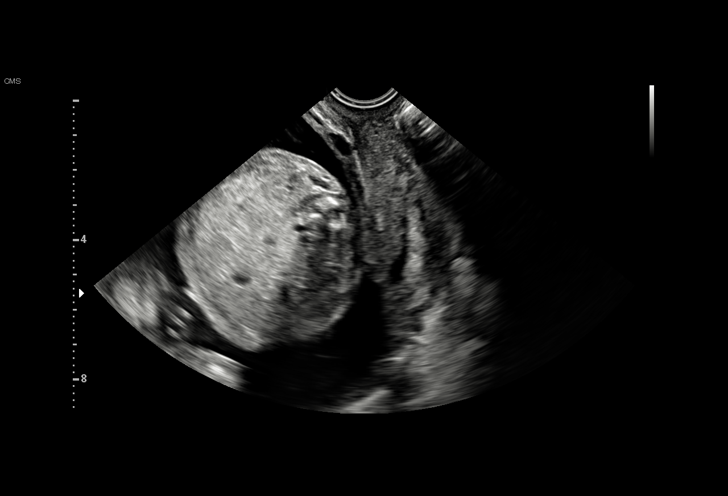
[im 15/15]
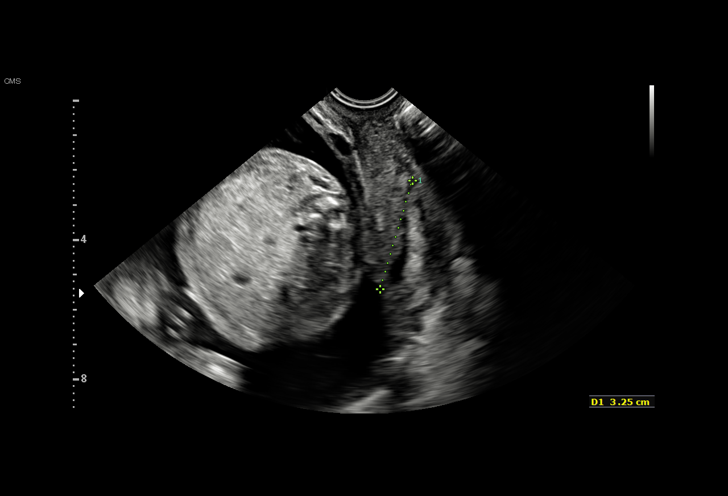

[15 of 15 positions shown; findings below may reference images not displayed]

DAG SIGMUND CNM

1  QUIRIJN AMAZIGH              [id number]      [id number]     [id number]
Indications

22 weeks gestation of pregnancy
Medical complication of pregnancy
(Myasthenia gravis)
Encounter for cervical length
OB History

Blood Type:            Height:  5'4"   Weight (lb):  115       BMI:
Gravidity:    2         Term:   1
Living:       1
Fetal Evaluation

Num Of Fetuses:     1
Fetal Heart         145
Rate(bpm):
Cardiac Activity:   Observed
Presentation:       Breech

Amniotic Fluid
AFI FV:      Subjectively within normal limits

Largest Pocket(cm)
5.61
Gestational Age

LMP:           22w 0d        Date:  05/04/17                 EDD:   02/08/18
Best:          22w 0d     Det. By:  LMP  (05/04/17)          EDD:   02/08/18
Cervix Uterus Adnexa
Cervix
Length:            3.6  cm.
Normal appearance by transvaginal scan
Impression

Intrauterine pregnancy at 22+0 weeks with suspected short
cervix here for transvaginal evaluation
Normal fetal cardiac activity
Presentation is breech
Transvaginal cervical length is 36mm without funnelling or
changes with transfundal pressure
Recommendations

Follow-up ultrasounds as clinically indicated.

## 2019-06-03 MED FILL — BLISOVI 24 FE 1-20 MG-MCG(2: 1-20 | 84 days supply | Qty: 84 | Fill #3

## 2019-06-03 MED FILL — PARoxetine HCL 20 MG TABS: 20 | 90 days supply | Qty: 90 | Fill #2

## 2019-06-20 DIAGNOSIS — Z20828 Contact with and (suspected) exposure to other viral communicable diseases: Secondary | ICD-10-CM | POA: Diagnosis not present

## 2019-06-20 DIAGNOSIS — R0981 Nasal congestion: Secondary | ICD-10-CM | POA: Diagnosis not present

## 2019-08-26 ENCOUNTER — Other Ambulatory Visit: Payer: Self-pay | Admitting: Obstetrics & Gynecology

## 2019-08-28 MED FILL — BLISOVI 24 FE 1-20 MG-MCG(2: 1-20 | 28 days supply | Qty: 28 | Fill #0

## 2019-08-29 ENCOUNTER — Ambulatory Visit: Payer: 59 | Admitting: Certified Nurse Midwife

## 2019-09-05 IMAGING — US US MFM OB FOLLOW-UP
1 series · 14 of 28 positions shown · non-contrast
Comparison: none

[Series 1: us mfm ob follow-up · 46 acquisitions, 14 frames shown]
[im 2/46]
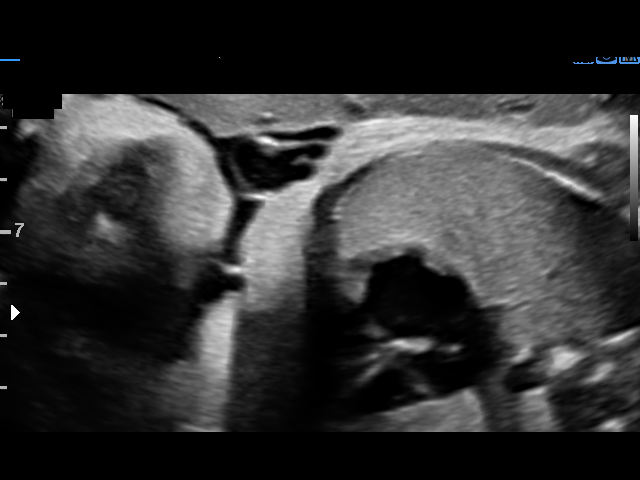
[im 6/46]
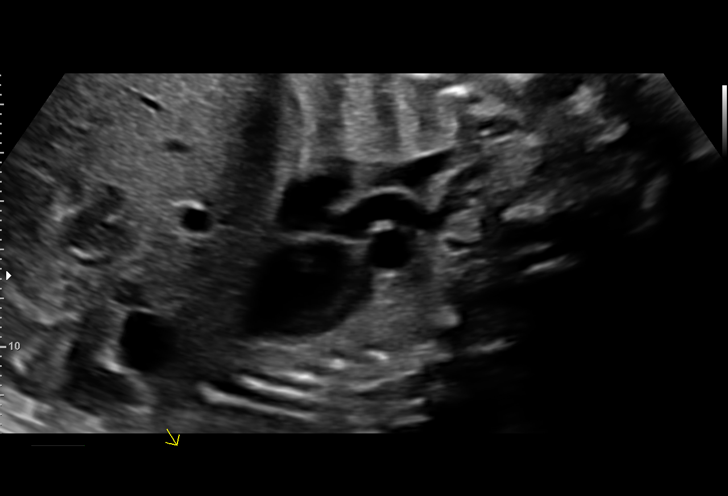
[im 9/46]
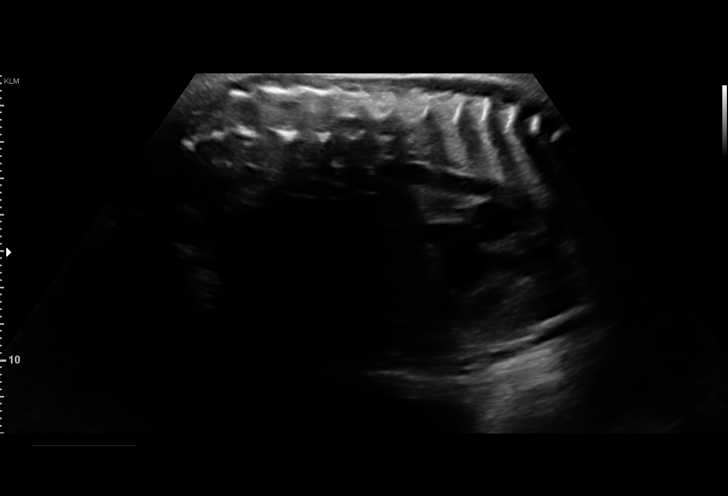
[im 12/46]
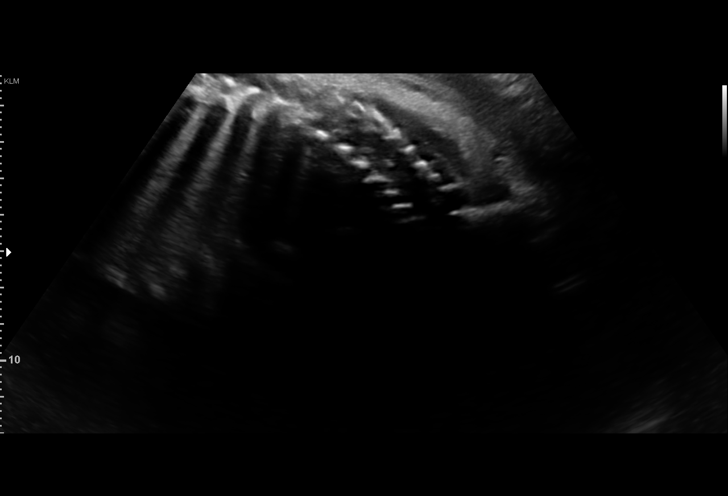
[im 16/46]
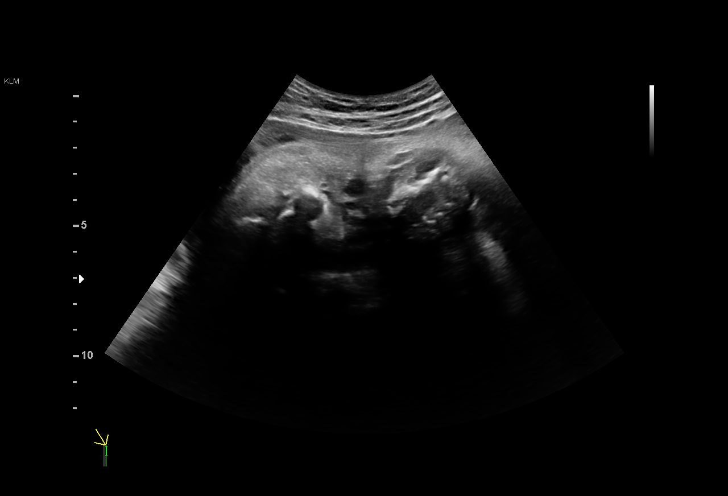
[im 19/46]
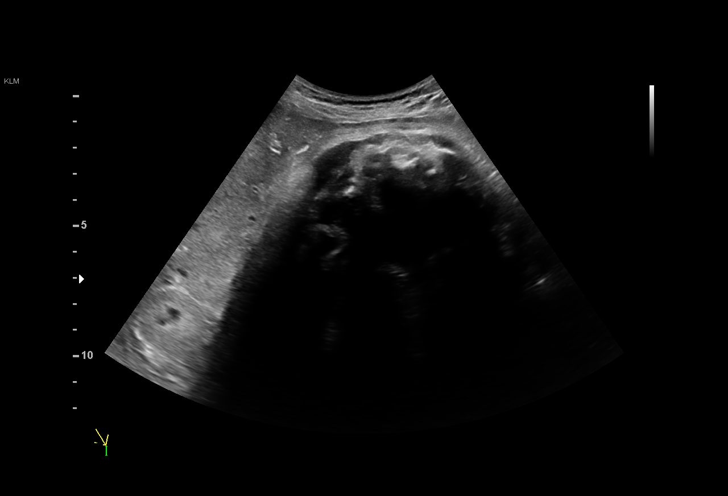
[im 22/46]
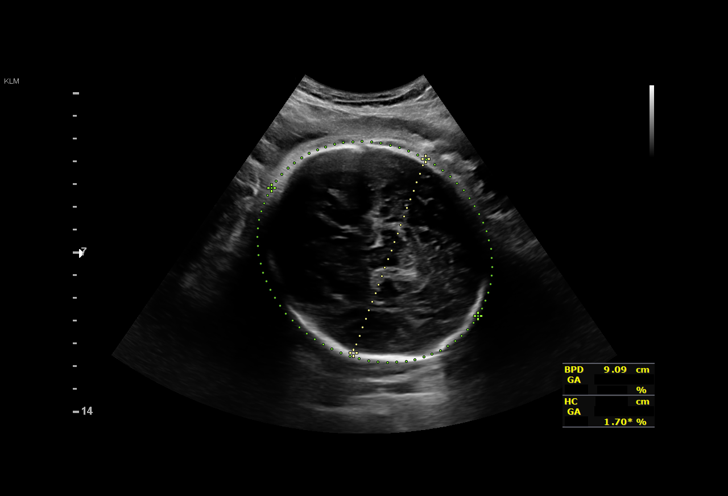
[im 26/46]
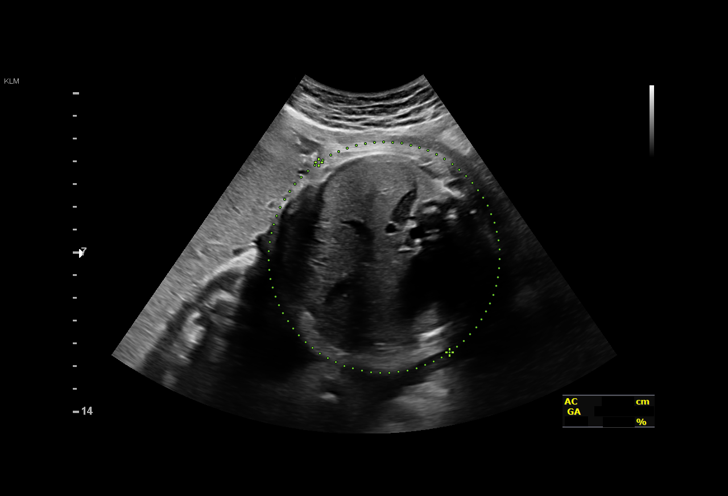
[im 29/46]
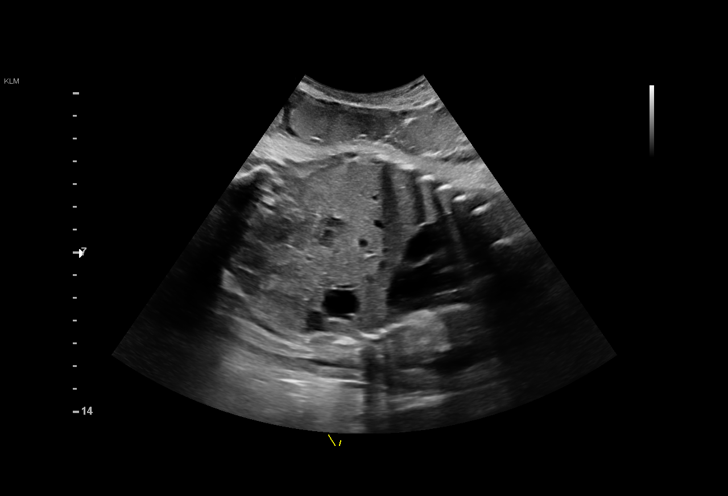
[im 32/46]
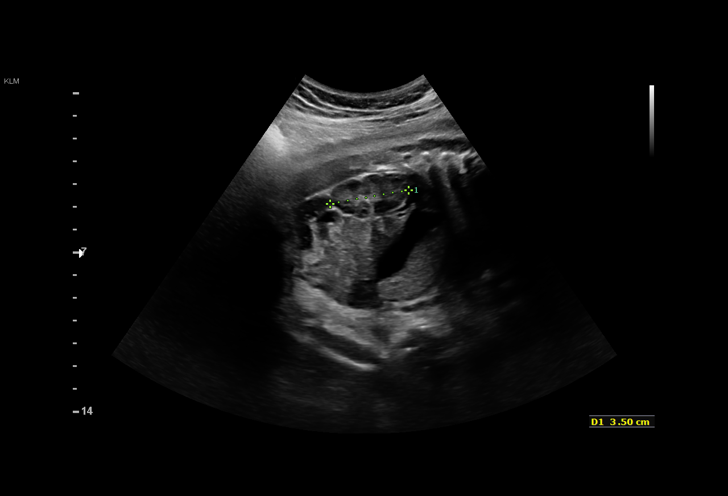
[im 36/46]
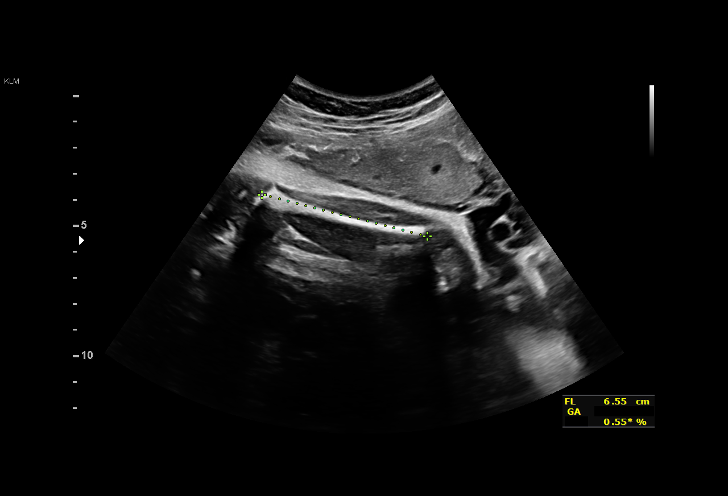
[im 39/46]
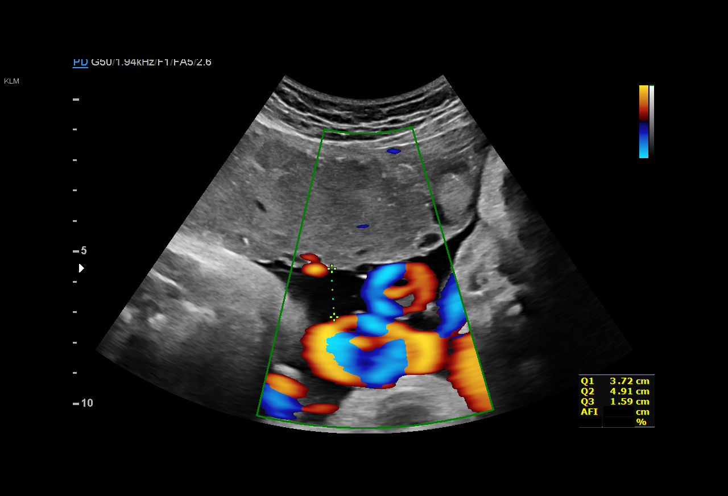
[im 42/46]
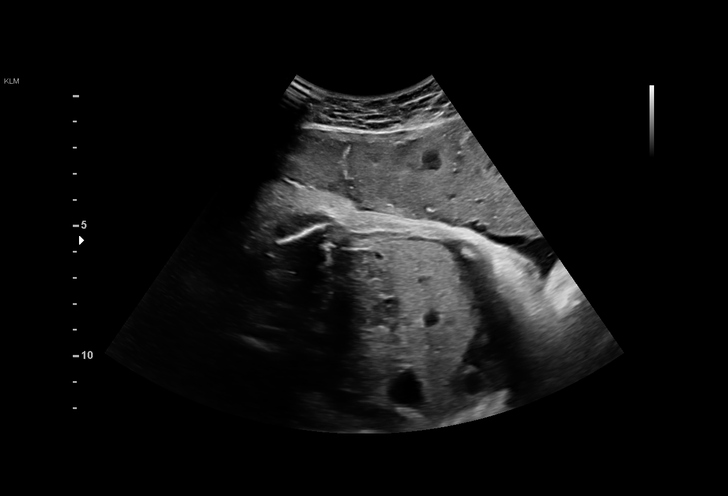
[im 46/46]
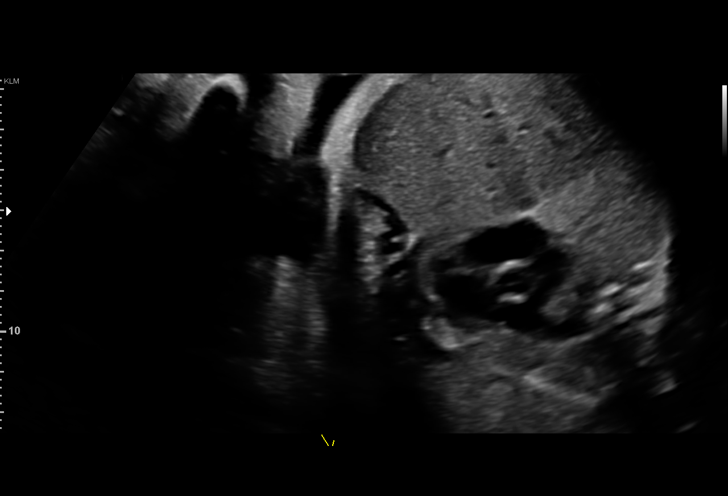

[14 of 28 positions shown; findings below may reference images not displayed]

MAMMADKAMAL CNM

1  AN HANK DJOYO ADI NEGORO           [id number]      [id number]     [id number]
Indications

37 weeks gestation of pregnancy
Medical complication of pregnancy
(Myasthenia gravis)
Uterine size-date discrepancy, third trimester
OB History

Blood Type:            Height:  5'4"   Weight (lb):  115       BMI:
Gravidity:    2         Term:   1
Living:       1
Fetal Evaluation

Num Of Fetuses:     1
Fetal Heart         132
Rate(bpm):
Cardiac Activity:   Observed
Presentation:       Cephalic
Placenta:           Anterior
P. Cord Insertion:  Visualized

Amniotic Fluid
AFI FV:      Subjectively within normal limits

AFI Sum(cm)     %Tile       Largest Pocket(cm)
12.53           44

RUQ(cm)       RLQ(cm)       LUQ(cm)        LLQ(cm)
3.72
Biometry
BPD:      92.2  mm     G. Age:  37w 3d         69  %    CI:        82.27   %    70 - 86
FL/HC:      21.1   %    20.8 -
HC:      320.7  mm     G. Age:  36w 1d          8  %    HC/AC:      1.00        0.92 -
AC:      319.5  mm     G. Age:  35w 6d         23  %    FL/BPD:     73.3   %    71 - 87
FL:       67.6  mm     G. Age:  34w 5d          4  %    FL/AC:      21.2   %    20 - 24

Est. FW:    5346  gm      6 lb 2 oz     34  %
Gestational Age

LMP:           37w 3d        Date:  05/04/17                 EDD:   02/08/18
U/S Today:     36w 0d                                        EDD:   02/18/18
Best:          37w 3d     Det. By:  LMP  (05/04/17)          EDD:   02/08/18
Anatomy

Cranium:               Appears normal         Aortic Arch:            Appears normal
Cavum:                 Previously seen        Ductal Arch:            Previously seen
Ventricles:            Previously seen        Diaphragm:              Appears normal
Choroid Plexus:        Previously seen        Stomach:                Appears normal, left
sided
Cerebellum:            Previously seen        Abdomen:                Appears normal
Posterior Fossa:       Previously seen        Abdominal Wall:         Previously seen
Nuchal Fold:           Previously seen        Cord Vessels:           Previously seen
Face:                  Orbits and profile     Kidneys:                Appear normal
previously seen
Lips:                  Previously seen        Bladder:                Appears normal
Thoracic:              Appears normal         Spine:                  Appears normal
Heart:                 Appears normal         Upper Extremities:      Previously seen
(4CH, axis, and situs
RVOT:                  Appears normal         Lower Extremities:      Previously seen
LVOT:                  Appears normal

Other:  Heels previously seen.
Cervix Uterus Adnexa

Cervix
Not visualized (advanced GA >47wks)
Impression

Patient is here for fetal growth assessment because of
uterine-size-date discrepancy on clinical examination.
Amniotic fluid is normal and good fetal activity is seen. Fetal
growth is appropriate for gestational age.

She does not have gestational diabetes.
Recommendations

Follow-up scans as clinically indicated.

## 2019-09-10 ENCOUNTER — Encounter: Payer: Self-pay | Admitting: Obstetrics and Gynecology

## 2019-09-10 ENCOUNTER — Other Ambulatory Visit: Payer: Self-pay

## 2019-09-10 ENCOUNTER — Ambulatory Visit (INDEPENDENT_AMBULATORY_CARE_PROVIDER_SITE_OTHER): Payer: 59 | Admitting: Obstetrics and Gynecology

## 2019-09-10 ENCOUNTER — Other Ambulatory Visit: Payer: Self-pay | Admitting: Obstetrics and Gynecology

## 2019-09-10 VITALS — BP 112/71 | HR 68 | Ht 63.0 in | Wt 122.0 lb

## 2019-09-10 DIAGNOSIS — Z01419 Encounter for gynecological examination (general) (routine) without abnormal findings: Secondary | ICD-10-CM | POA: Insufficient documentation

## 2019-09-10 MED ORDER — NORETHIN ACE-ETH ESTRAD-FE 1-20 MG-MCG(24) PO TABS: 1.0000 | ORAL_TABLET | Freq: Every day | ORAL | 11 refills | Status: DC

## 2019-09-10 NOTE — Progress Notes (Signed)
GYNECOLOGY ANNUAL PREVENTATIVE CARE ENCOUNTER NOTE  History:     Robin Robertson is a 34 y.o. [id number] female here for a routine annual gynecologic exam.  Current complaints: None.   Denies abnormal vaginal bleeding, discharge, pelvic pain, problems with intercourse or other gynecologic concerns.  Has been having night sweats since the birth of her first daughter. No cough or fever. Does drink occasional wine; she is unsure if this makes it worse. Has not mentioned this to PCP.    Gynecologic History Patient's last menstrual period was 08/26/2019. Contraception: OCP (estrogen/progesterone) Last Pap: 2019. Results were: normal with negative HPV Last mammogram: NA  Obstetric History OB History  Gravida Para Term Preterm AB Living  2 2 2     2   SAB TAB Ectopic Multiple Live Births        0 1    # Outcome Date GA Lbr Len/2nd Weight Sex Delivery Anes PTL Lv  2 Term 05/20/15 [redacted]w[redacted]d 14:57 / 01:52 6 lb 1.4 oz (2.761 kg) F Vag-Vacuum EPI  LIV     Birth Comments: wnl  1 Term             Past Medical History:  Diagnosis Date  . Headache   . Myasthenia gravis ([place])   . Myasthenia gravis ([place])   . Post partum depression     Past Surgical History:  Procedure Laterality Date  . WISDOM TOOTH EXTRACTION      Current Outpatient Medications on File Prior to Visit  Medication Sig Dispense Refill  . PARoxetine (PAXIL) 20 MG tablet Take 1 tablet (20 mg total) by mouth daily. 90 tablet 3   No current facility-administered medications on file prior to visit.    Allergies  Allergen Reactions  . Fentanyl Anaphylaxis    Stopped breathing, heart rate dropped to 8bpm  . Influenza Vaccines Shortness Of Breath    Chest pain, Left side of body went numb for 2 weeks  . Vicodin [Hydrocodone-Acetaminophen] Rash    Anger and Black outs    Social History:  reports that she has never smoked. She has never used smokeless tobacco. She reports that she does not drink alcohol or use  drugs.  Family History  Problem Relation Age of Onset  . Hypertension Father   . Cerebrovascular Disease Maternal Grandmother   . Heart disease Paternal Grandmother   . Myasthenia gravis Paternal Grandmother   . Hypertension Paternal Grandfather     The following portions of the patient's history were reviewed and updated as appropriate: allergies, current medications, past family history, past medical history, past social history, past surgical history and problem list.  Review of Systems Pertinent items noted in HPI and remainder of comprehensive ROS otherwise negative.  Physical Exam:  BP 112/71   Pulse 68   Ht 5\' 3"  (1.6 m)   Wt 122 lb (55.3 kg)   LMP 08/26/2019   BMI 21.61 kg/m  CONSTITUTIONAL: Well-developed, well-nourished female in no acute distress.  HENT:  Normocephalic, atraumatic, External right and left ear normal. Oropharynx is clear and moist EYES: Conjunctivae and EOM are normal. Pupils are equal, round, and reactive to light. No scleral icterus.  NECK: Normal range of motion, supple, no masses.  Normal thyroid.  SKIN: Skin is warm and dry. No rash noted. Not diaphoretic. No erythema. No pallor. MUSCULOSKELETAL: Normal range of motion. No tenderness.  No cyanosis, clubbing, or edema.  2+ distal pulses. NEUROLOGIC: Alert and oriented to person, place, and time. Normal  reflexes, muscle tone coordination.  PSYCHIATRIC: Normal mood and affect. Normal behavior. Normal judgment and thought content. CARDIOVASCULAR: Normal heart rate noted, regular rhythm RESPIRATORY: Clear to auscultation bilaterally. Effort and breath sounds normal, no problems with respiration noted. BREASTS: Symmetric in size. No masses, tenderness, skin changes, nipple drainage, or lymphadenopathy bilaterally. ABDOMEN: Soft, no distention noted.  No tenderness, rebound or guarding.  PELVIC: Normal appearing external genitalia and urethral meatus; Normal uterine size, no other palpable masses, no  uterine or adnexal tenderness.   Assessment and Plan:   A:  1. Women's annual routine gynecological examination  Refill on BC Discuss night sweats with PCP. Food diary recommended: are the night sweats worse with alcohol use?    Routine preventative health maintenance measures emphasized. Please refer to After Visit Summary for other counseling recommendations.    Syanne Looney, Artist Pais, [place] for [employer], [place]

## 2019-09-11 MED FILL — PARoxetine HCL 20 MG TABS: 20 | 90 days supply | Qty: 90 | Fill #3

## 2019-09-19 MED FILL — LARIN 24 FE 1 MG-20 MCG TAB: 1-20 | 28 days supply | Qty: 28 | Fill #0

## 2019-10-09 ENCOUNTER — Telehealth: Payer: 59 | Admitting: Physician Assistant

## 2019-10-09 DIAGNOSIS — J329 Chronic sinusitis, unspecified: Secondary | ICD-10-CM

## 2019-10-09 MED ORDER — FLUTICASONE PROPIONATE 50 MCG/ACT NA SUSP: 2.0000 | Freq: Every day | NASAL | 0 refills | Status: DC

## 2019-10-09 MED FILL — FLUTICASONE PROP 50 MCG SPR: 50 | 30 days supply | Qty: 16 | Fill #0

## 2019-10-09 NOTE — Progress Notes (Signed)
We are sorry that you are not feeling well.  Here is how we plan to help!  Based on what you have shared with me it looks like you have sinusitis.  Sinusitis is inflammation and infection in the sinus cavities of the head.  Based on your presentation I believe you most likely have Acute Viral Sinusitis.This is an infection most likely caused by a virus. There is not specific treatment for viral sinusitis other than to help you with the symptoms until the infection runs its course. Antibiotics will not be helpful in treating viral sinusitis as antibiotics are only effective against bacteria. You may use an oral decongestant such as Mucinex D or if you have glaucoma or high blood pressure use plain Mucinex. Saline nasal spray help and can safely be used as often as needed for congestion, I have prescribed: Fluticasone nasal spray two sprays in each nostril once a day  Some authorities believe that zinc sprays or the use of Echinacea may shorten the course of your symptoms.  Sinus infections are not as easily transmitted as other respiratory infection, however we still recommend that you avoid close contact with loved ones, especially the very young and elderly.  Remember to wash your hands thoroughly throughout the day as this is the number one way to prevent the spread of infection!  Home Care:  Only take medications as instructed by your medical team.  Do not take these medications with alcohol.  A steam or ultrasonic humidifier can help congestion.  You can place a towel over your head and breathe in the steam from hot water coming from a faucet.  Avoid close contacts especially the very young and the elderly.  Cover your mouth when you cough or sneeze.  Always remember to wash your hands.  Get Help Right Away If:  You develop worsening fever or sinus pain.  You develop a severe head ache or visual changes.  Your symptoms persist after you have completed your treatment plan.  Make sure  you  Understand these instructions.  Will watch your condition.  Will get help right away if you are not doing well or get worse.  Your e-visit answers were reviewed by a board certified advanced clinical practitioner to complete your personal care plan.  Depending on the condition, your plan could have included both over the counter or prescription medications.  If there is a problem please reply  once you have received a response from your provider.  Your safety is important to [place].  If you have drug allergies check your prescription carefully.    You can use MyChart to ask questions about today's visit, request a non-urgent call back, or ask for a work or school excuse for 24 hours related to this e-Visit. If it has been greater than 24 hours you will need to follow up with your provider, or enter a new e-Visit to address those concerns.  You will get an e-mail in the next two days asking about your experience.  I hope that your e-visit has been valuable and will speed your recovery. Thank you for using e-visits.  Approximately 5 minutes was spent documenting and reviewing patient's chart.

## 2019-10-20 MED FILL — LARIN 24 FE 1 MG-20 MCG TAB: 1-20 | 28 days supply | Qty: 28 | Fill #1

## 2019-11-18 MED FILL — BLISOVI 24 FE 1-20 MG-MCG(2: 1-20 | 84 days supply | Qty: 84 | Fill #2

## 2019-12-11 ENCOUNTER — Other Ambulatory Visit: Payer: Self-pay | Admitting: Osteopathic Medicine

## 2019-12-11 DIAGNOSIS — Z8659 Personal history of other mental and behavioral disorders: Secondary | ICD-10-CM

## 2019-12-24 ENCOUNTER — Other Ambulatory Visit: Payer: Self-pay | Admitting: Osteopathic Medicine

## 2019-12-24 ENCOUNTER — Encounter: Payer: Self-pay | Admitting: Osteopathic Medicine

## 2019-12-24 ENCOUNTER — Ambulatory Visit (INDEPENDENT_AMBULATORY_CARE_PROVIDER_SITE_OTHER): Payer: 59 | Admitting: Osteopathic Medicine

## 2019-12-24 VITALS — BP 104/67 | HR 67 | Temp 98.2°F | Wt 125.1 lb

## 2019-12-24 DIAGNOSIS — Z0000 Encounter for general adult medical examination without abnormal findings: Secondary | ICD-10-CM

## 2019-12-24 DIAGNOSIS — Z8659 Personal history of other mental and behavioral disorders: Secondary | ICD-10-CM | POA: Diagnosis not present

## 2019-12-24 MED ORDER — PAROXETINE HCL 20 MG PO TABS: 20.0000 mg | ORAL_TABLET | Freq: Every day | ORAL | 3 refills | Status: DC

## 2019-12-24 MED ORDER — NORETHIN ACE-ETH ESTRAD-FE 1-20 MG-MCG(24) PO TABS: 1.0000 | ORAL_TABLET | Freq: Every day | ORAL | 4 refills | Status: DC

## 2019-12-24 MED ORDER — FLUTICASONE PROPIONATE 50 MCG/ACT NA SUSP: 1.0000 | Freq: Every day | NASAL | 4 refills | Status: DC

## 2019-12-24 MED FILL — PARoxetine HCL 20 MG TABS: 20 | 90 days supply | Qty: 90 | Fill #0

## 2019-12-24 MED FILL — FLUTICASONE PROP 50 MCG SPR: 50 | 90 days supply | Qty: 48 | Fill #0

## 2019-12-24 NOTE — Patient Instructions (Addendum)
General Preventive Care  Most recent routine screening labs: ordered today.   Blood pressure goal 130/80 or less.   Tobacco: don't!   Alcohol: responsible moderation is ok for most adults - if you have concerns about your alcohol intake, please talk to me!   Exercise: as tolerated to reduce risk of cardiovascular disease and diabetes. Strength training will also prevent osteoporosis.   Mental health: if need for mental health care (medicines, counseling, other), or concerns about moods, please let me know!   Sexual / Reproductive health: if need for STD testing, or if concerns with libido/pain problems, please let me know! If you need to discuss family planning, please let me know!   Advanced Directive: Living Will and/or Healthcare Power of Attorney recommended for all adults, regardless of age or health.  Vaccines  Flu vaccine: for almost everyone, every fall.   Shingles vaccine: after age 57.   Pneumonia vaccines: after age 68  Tetanus booster: every 10 years / 3rd trimester of pregnancy  HPV vaccine: Gardasil up to age 50 to prevent HPV-associated diseases, including certain cancers.   COVID vaccine: THANKS for getting your vaccine! :) / STRONGLY RECOMMENDED  Cancer screenings   Colon cancer screening: for everyone age 13-75.   Breast cancer screening: mammogram at age 67   Cervical cancer screening: Pap due no later than 07/2022  Lung cancer screening: not needed for non-smokers  Infection screenings  . HIV: recommended screening at least once age 49-65, more often as needed. . Gonorrhea/Chlamydia: screening as needed, though many insurances require testing for anyone on birth control pills. . Hepatitis C: recommended once for everyone age 68-75 . TB: certain at-risk populations, or depending on work requirements and/or travel history Other . Bone Density Test: recommended at age 56

## 2019-12-24 NOTE — Progress Notes (Signed)
HPI: Robin Robertson is a 34 y.o. female who  has a past medical history of Headache, Myasthenia gravis ([place]), Myasthenia gravis ([place]), and Post partum depression.  she presents to [facility] today, 12/24/19,  for chief complaint of: Annual physical     Patient here for annual physical / wellness exam.  See preventive care reviewed as below.   Additional concerns today include:  None!     Past medical, surgical, social and family history reviewed:  Patient Active Problem List   Diagnosis Date Noted  . Women's annual routine gynecological examination 09/10/2019  . Migraine without aura and without status migrainosus, not intractable 08/05/2018  . Depression 08/05/2018  . Fatigue 06/14/2018  . Postpartum depression 02/18/2018  . Rubella non-immune status, antepartum 07/16/2017  . Myasthenia gravis ([place]) 11/24/2014    Past Surgical History:  Procedure Laterality Date  . WISDOM TOOTH EXTRACTION      Social History   Tobacco Use  . Smoking status: Never Smoker  . Smokeless tobacco: Never Used  Substance Use Topics  . Alcohol use: No    Alcohol/week: 0.0 standard drinks    Family History  Problem Relation Age of Onset  . Hypertension Father   . Cerebrovascular Disease Maternal Grandmother   . Heart disease Paternal Grandmother   . Myasthenia gravis Paternal Grandmother   . Hypertension Paternal Grandfather      Current medication list and allergy/intolerance information reviewed:    Current Outpatient Medications  Medication Sig Dispense Refill  . fluticasone (FLONASE) 50 MCG/ACT nasal spray Place 2 sprays into both nostrils daily. 16 g 0  . Norethindrone Acetate-Ethinyl Estrad-FE (LOESTRIN 24 FE) 1-20 MG-MCG(24) tablet Take 1 tablet by mouth daily. 1 Package 11  . PARoxetine (PAXIL) 20 MG tablet Take 1 tablet (20 mg total) by mouth daily. Needs appointment. 15 tablet 0   No current facility-administered medications for  this visit.    Allergies  Allergen Reactions  . Fentanyl Anaphylaxis    Stopped breathing, heart rate dropped to 8bpm  . Influenza Vaccines Shortness Of Breath    Chest pain, Left side of body went numb for 2 weeks  . Vicodin [Hydrocodone-Acetaminophen] Rash    Anger and Black outs     Exam:  BP 104/67 (BP Location: Left Arm, Patient Position: Sitting, Cuff Size: Normal)   Pulse 67   Temp 98.2 F (36.8 C) (Oral)   Wt 125 lb 1.9 oz (56.8 kg)   BMI 22.16 kg/m   Constitutional: VS see above. General Appearance: alert, well-developed, well-nourished, NAD  Eyes: Normal lids and conjunctive, non-icteric sclera  Ears, Nose, Mouth, Throat: TM normal bilaterally.   Neck: No masses, trachea midline. No thyroid enlargement. No tenderness/mass appreciated. No lymphadenopathy  Respiratory: Normal respiratory effort. no wheeze, no rhonchi, no rales  Cardiovascular: S1/S2 normal, no murmur, no rub/gallop auscultated. RRR. No lower extremity edema.   Gastrointestinal: Nontender, no masses. No hepatomegaly, no splenomegaly. No hernia appreciated. Bowel sounds normal. Rectal exam deferred.   Musculoskeletal: Gait normal. No clubbing/cyanosis of digits.   Neurological: Normal balance/coordination. No tremor. No cranial nerve deficit on limited exam. Motor and sensation intact and symmetric. Cerebellar reflexes intact.   Skin: warm, dry, intact. No rash/ulcer. No concerning nevi or subq nodules on limited exam.    Psychiatric: Normal judgment/insight. Normal mood and affect. Oriented x3.      ASSESSMENT/PLAN: The encounter diagnosis was Annual physical exam.   Patient Instructions  General Preventive Care  Most recent  routine screening labs: ordered today.   Blood pressure goal 130/80 or less.   Tobacco: don't!   Alcohol: responsible moderation is ok for most adults - if you have concerns about your alcohol intake, please talk to me!   Exercise: as tolerated to reduce risk of  cardiovascular disease and diabetes. Strength training will also prevent osteoporosis.   Mental health: if need for mental health care (medicines, counseling, other), or concerns about moods, please let me know!   Sexual / Reproductive health: if need for STD testing, or if concerns with libido/pain problems, please let me know! If you need to discuss family planning, please let me know!   Advanced Directive: Living Will and/or Healthcare Power of Attorney recommended for all adults, regardless of age or health.  Vaccines  Flu vaccine: for almost everyone, every fall.   Shingles vaccine: after age 109.   Pneumonia vaccines: after age 51  Tetanus booster: every 10 years / 3rd trimester of pregnancy  HPV vaccine: Gardasil up to age 84 to prevent HPV-associated diseases, including certain cancers.   COVID vaccine: THANKS for getting your vaccine! :) / STRONGLY RECOMMENDED  Cancer screenings   Colon cancer screening: for everyone age 25-75.   Breast cancer screening: mammogram at age 60   Cervical cancer screening: Pap due no later than 07/2022  Lung cancer screening: not needed for non-smokers  Infection screenings  . HIV: recommended screening at least once age 3-65, more often as needed. . Gonorrhea/Chlamydia: screening as needed, though many insurances require testing for anyone on birth control pills. . Hepatitis C: recommended once for everyone age 27-75 . TB: certain at-risk populations, or depending on work requirements and/or travel history Other . Bone Density Test: recommended at age 55       Visit summary with medication list and pertinent instructions was printed for patient to review. All questions at time of visit were answered - patient instructed to contact office with any additional concerns or updates. ER/RTC precautions were reviewed with the patient.      Please note: voice recognition software was used to produce this document, and typos may escape  review. Please contact Dr. Sheppard Coil for any needed clarifications.     Follow-up plan: Return in about 1 year (around 12/23/2020) for [facility] (get labs prior to visit, orders are in).

## 2019-12-25 ENCOUNTER — Ambulatory Visit: Payer: 59 | Admitting: Osteopathic Medicine

## 2019-12-25 LAB — CBC
HCT: 40.9 % (ref 35.0–45.0)
Hemoglobin: 13.8 g/dL (ref 11.7–15.5)
MCH: 30.7 pg (ref 27.0–33.0)
MCHC: 33.7 g/dL (ref 32.0–36.0)
MCV: 90.9 fL (ref 80.0–100.0)
MPV: 10.2 fL (ref 7.5–12.5)
Platelets: 276 10*3/uL (ref 140–400)
RBC: 4.5 10*6/uL (ref 3.80–5.10)
RDW: 12.2 % (ref 11.0–15.0)
WBC: 8.4 10*3/uL (ref 3.8–10.8)

## 2019-12-25 LAB — COMPLETE METABOLIC PANEL WITH GFR
AG Ratio: 2 (calc) (ref 1.0–2.5)
ALT: 15 U/L (ref 6–29)
AST: 15 U/L (ref 10–30)
Albumin: 4.5 g/dL (ref 3.6–5.1)
Alkaline phosphatase (APISO): 44 U/L (ref 31–125)
BUN: 18 mg/dL (ref 7–25)
CO2: 26 mmol/L (ref 20–32)
Calcium: 9.3 mg/dL (ref 8.6–10.2)
Chloride: 103 mmol/L (ref 98–110)
Creat: 0.93 mg/dL (ref 0.50–1.10)
GFR, Est African American: 93 mL/min/{1.73_m2} (ref >=60)
GFR, Est Non African American: 80 mL/min/{1.73_m2} (ref >=60)
Globulin: 2.3 g/dL (calc) (ref 1.9–3.7)
Glucose, Bld: 131 mg/dL (ref 65–139)
Potassium: 3.7 mmol/L (ref 3.5–5.3)
Sodium: 137 mmol/L (ref 135–146)
Total Bilirubin: 0.5 mg/dL (ref 0.2–1.2)
Total Protein: 6.8 g/dL (ref 6.1–8.1)

## 2019-12-25 LAB — LIPID PANEL
Cholesterol: 151 mg/dL (ref <200)
HDL: 66 mg/dL (ref >=50)
LDL Cholesterol (Calc): 66 mg/dL (calc)
Non-HDL Cholesterol (Calc): 85 mg/dL (calc) (ref <130)
Total CHOL/HDL Ratio: 2.3 (calc) (ref <5.0)
Triglycerides: 100 mg/dL (ref <150)

## 2020-02-17 MED FILL — BLISOVI 24 FE 1-20 MG-MCG(2: 1-20 | 84 days supply | Qty: 84 | Fill #3

## 2020-03-24 MED FILL — PARoxetine HCL 20 MG TABS: 20 | 90 days supply | Qty: 90 | Fill #1

## 2020-05-31 MED FILL — PARoxetine HCL 20 MG TABS: 20 | 30 days supply | Qty: 30 | Fill #2

## 2020-06-03 MED FILL — BLISOVI 24 FE 1-20 MG-MCG(2: 1-20 | 84 days supply | Qty: 84 | Fill #4

## 2020-06-29 MED FILL — PARoxetine HCL 20 MG TABS: 20 | 30 days supply | Qty: 30 | Fill #3

## 2020-07-09 DIAGNOSIS — H5213 Myopia, bilateral: Secondary | ICD-10-CM | POA: Diagnosis not present

## 2020-07-09 DIAGNOSIS — H33323 Round hole, bilateral: Secondary | ICD-10-CM | POA: Diagnosis not present

## 2020-07-09 DIAGNOSIS — Z135 Encounter for screening for eye and ear disorders: Secondary | ICD-10-CM | POA: Diagnosis not present

## 2020-07-23 ENCOUNTER — Other Ambulatory Visit: Payer: Self-pay | Admitting: Physician Assistant

## 2020-07-23 ENCOUNTER — Telehealth: Payer: 59 | Admitting: Physician Assistant

## 2020-07-23 DIAGNOSIS — J329 Chronic sinusitis, unspecified: Secondary | ICD-10-CM

## 2020-07-23 MED ORDER — AMOXICILLIN-POT CLAVULANATE 875-125 MG PO TABS
1.0000 | ORAL_TABLET | Freq: Two times a day (BID) | ORAL | 0 refills | Status: DC
Start: 2020-07-23 — End: 2020-07-23

## 2020-07-23 MED FILL — AMOX-CLAV 875-125 MG TABLET: 875-125 | 7 days supply | Qty: 14 | Fill #0

## 2020-07-23 NOTE — Progress Notes (Signed)

## 2020-07-28 MED FILL — PARoxetine HCL 20 MG TABS: 20 | 30 days supply | Qty: 30 | Fill #4

## 2020-08-30 MED FILL — PARoxetine HCL 20 MG TABS: 20 | 30 days supply | Qty: 30 | Fill #5

## 2020-08-31 MED FILL — BLISOVI 24 FE 1-20 MG-MCG(2: 1-20 | 28 days supply | Qty: 28 | Fill #5

## 2020-09-27 ENCOUNTER — Telehealth: Payer: Self-pay | Admitting: Adult Health

## 2020-09-27 ENCOUNTER — Telehealth: Payer: Self-pay

## 2020-09-27 ENCOUNTER — Other Ambulatory Visit: Payer: Self-pay | Admitting: Adult Health

## 2020-09-27 DIAGNOSIS — Z789 Other specified health status: Secondary | ICD-10-CM

## 2020-09-27 MED ORDER — NORETHIN ACE-ETH ESTRAD-FE 1-20 MG-MCG(24) PO TABS: 1.0000 | ORAL_TABLET | Freq: Every day | ORAL | 0 refills | Status: DC

## 2020-09-27 MED ORDER — PYRIDOSTIGMINE BROMIDE 60 MG PO TABS
30.0000 mg | ORAL_TABLET | Freq: Two times a day (BID) | ORAL | 0 refills | Status: DC
Start: 2020-09-27 — End: 2020-09-27

## 2020-09-27 MED FILL — BLISOVI 24 FE 1-20 MG-MCG(2: 1-20 | 28 days supply | Qty: 28 | Fill #0

## 2020-09-27 MED FILL — PARoxetine HCL 20 MG TABS: 20 | 30 days supply | Qty: 30 | Fill #6

## 2020-09-27 MED FILL — PYRIDOSTIGMINE BR 60 MG TAB: 60 | 90 days supply | Qty: 90 | Fill #0

## 2020-09-27 NOTE — Telephone Encounter (Signed)
Spoke to [place], with [facility].  Pt picked up 90 day supply 08-07-2018.  Nothing after that.

## 2020-09-27 NOTE — Telephone Encounter (Signed)
LMVM for pt and [facility] pharm[facility] to call re: this mestinon for pt.  Has she been taking?  Last prescribed 08-05-2018 #90 with 2 refill 1/2 tab bid

## 2020-09-27 NOTE — Telephone Encounter (Signed)
Pt request refill Mestinon 60 mg  at [facility]

## 2020-09-27 NOTE — Telephone Encounter (Signed)
Pt called needing OCP refill. Pt needs annual appt. Annual scheduled for 4/13. One refill of OCP sent.

## 2020-09-27 NOTE — Telephone Encounter (Signed)
I called pt.  She is experiencing MG sx generalized weakness/fatigue over the lat 2 wk, on and off, but seems to be getting worse.  She has been taking mestinin 30mg  po bid only prn.  Last big crisis was about 2 yrs ago when she had her last child.  I offered 1430 today but she is not able to do that, appt was made by phone staff 02-18-2021.  No availablity this week unless cancellation.  She was asking for refill but last fill 08-07-18  90 day supply at [place] and needs new prescription. She works at [employer]. I relayed will ask if prescription can be given until seen.  Please advise.

## 2020-09-27 NOTE — Addendum Note (Signed)
Addended by: Brandon Melnick on: 09/27/2020 02:40 PM   Modules accepted: Orders

## 2020-09-27 NOTE — Telephone Encounter (Signed)
Pt returned phone call. Would like a call back. 

## 2020-09-27 NOTE — Telephone Encounter (Signed)
I would strongly encourage patient to come in and be seen by me or MD.  Faythe Ghee for refills

## 2020-09-27 NOTE — Telephone Encounter (Signed)
LMVM for pt that returned her call.

## 2020-09-27 NOTE — Telephone Encounter (Signed)
Refilled ok'd per MM/NP spoke to pt and relayed that did this for her #90 30mg  po bid.  Mestinon at [facility].  Will keep her for cancellation list to call.  She appreciated this.

## 2020-10-02 ENCOUNTER — Other Ambulatory Visit (HOSPITAL_COMMUNITY): Payer: Self-pay

## 2020-10-13 ENCOUNTER — Other Ambulatory Visit: Payer: Self-pay

## 2020-10-13 ENCOUNTER — Other Ambulatory Visit (HOSPITAL_COMMUNITY): Payer: Self-pay

## 2020-10-13 ENCOUNTER — Encounter: Payer: Self-pay | Admitting: Medical-Surgical

## 2020-10-13 ENCOUNTER — Ambulatory Visit: Payer: Self-pay | Admitting: Obstetrics and Gynecology

## 2020-10-13 ENCOUNTER — Ambulatory Visit (INDEPENDENT_AMBULATORY_CARE_PROVIDER_SITE_OTHER): Payer: BC Managed Care – PPO | Admitting: Medical-Surgical

## 2020-10-13 VITALS — BP 98/61 | HR 80 | Temp 98.6°F | Ht 63.25 in | Wt 136.1 lb

## 2020-10-13 DIAGNOSIS — Z1329 Encounter for screening for other suspected endocrine disorder: Secondary | ICD-10-CM | POA: Diagnosis not present

## 2020-10-13 DIAGNOSIS — Z1159 Encounter for screening for other viral diseases: Secondary | ICD-10-CM | POA: Diagnosis not present

## 2020-10-13 DIAGNOSIS — Z789 Other specified health status: Secondary | ICD-10-CM

## 2020-10-13 DIAGNOSIS — Z0000 Encounter for general adult medical examination without abnormal findings: Secondary | ICD-10-CM | POA: Diagnosis not present

## 2020-10-13 DIAGNOSIS — G7000 Myasthenia gravis without (acute) exacerbation: Secondary | ICD-10-CM

## 2020-10-13 MED ORDER — NORETHIN ACE-ETH ESTRAD-FE 1-20 MG-MCG(24) PO TABS
1.0000 | ORAL_TABLET | Freq: Every day | ORAL | 0 refills | Status: DC
  Filled 2020-10-13 – 2020-10-25 (×2): qty 28, 28d supply, fill #0

## 2020-10-13 NOTE — Patient Instructions (Signed)
Preventive Care 21-35 Years Old, Female Preventive care refers to lifestyle choices and visits with your health care provider that can promote health and wellness. This includes:  A yearly physical exam. This is also called an annual wellness visit.  Regular dental and eye exams.  Immunizations.  Screening for certain conditions.  Healthy lifestyle choices, such as: ? Eating a healthy diet. ? Getting regular exercise. ? Not using drugs or products that contain nicotine and tobacco. ? Limiting alcohol use. What can I expect for my preventive care visit? Physical exam Your health care provider may check your:  Height and weight. These may be used to calculate your BMI (body mass index). BMI is a measurement that tells if you are at a healthy weight.  Heart rate and blood pressure.  Body temperature.  Skin for abnormal spots. Counseling Your health care provider may ask you questions about your:  Past medical problems.  Family's medical history.  Alcohol, tobacco, and drug use.  Emotional well-being.  Home life and relationship well-being.  Sexual activity.  Diet, exercise, and sleep habits.  Work and work environment.  Access to firearms.  Method of birth control.  Menstrual cycle.  Pregnancy history. What immunizations do I need? Vaccines are usually given at various ages, according to a schedule. Your health care provider will recommend vaccines for you based on your age, medical history, and lifestyle or other factors, such as travel or where you work.   What tests do I need? Blood tests  Lipid and cholesterol levels. These may be checked every 5 years starting at age 20.  Hepatitis C test.  Hepatitis B test. Screening  Diabetes screening. This is done by checking your blood sugar (glucose) after you have not eaten for a while (fasting).  STD (sexually transmitted disease) testing, if you are at risk.  BRCA-related cancer screening. This may be  done if you have a family history of breast, ovarian, tubal, or peritoneal cancers.  Pelvic exam and Pap test. This may be done every 3 years starting at age 21. Starting at age 30, this may be done every 5 years if you have a Pap test in combination with an HPV test. Talk with your health care provider about your test results, treatment options, and if necessary, the need for more tests.   Follow these instructions at home: Eating and drinking  Eat a healthy diet that includes fresh fruits and vegetables, whole grains, lean protein, and low-fat dairy products.  Take vitamin and mineral supplements as recommended by your health care provider.  Do not drink alcohol if: ? Your health care provider tells you not to drink. ? You are pregnant, may be pregnant, or are planning to become pregnant.  If you drink alcohol: ? Limit how much you have to 0-1 drink a day. ? Be aware of how much alcohol is in your drink. In the [place] one drink equals one 12 oz bottle of beer (355 mL), one 5 oz glass of wine (148 mL), or one 1 oz glass of hard liquor (44 mL).   Lifestyle  Take daily care of your teeth and gums. Brush your teeth every morning and night with fluoride toothpaste. Floss one time each day.  Stay active. Exercise for at least 30 minutes 5 or more days each week.  Do not use any products that contain nicotine or tobacco, such as cigarettes, e-cigarettes, and chewing tobacco. If you need help quitting, ask your health care provider.  Do not   use drugs.  If you are sexually active, practice safe sex. Use a condom or other form of protection to prevent STIs (sexually transmitted infections).  If you do not wish to become pregnant, use a form of birth control. If you plan to become pregnant, see your health care provider for a prepregnancy visit.  Find healthy ways to cope with stress, such as: ? Meditation, yoga, or listening to music. ? Journaling. ? Talking to a trusted  person. ? Spending time with friends and family. Safety  Always wear your seat belt while driving or riding in a vehicle.  Do not drive: ? If you have been drinking alcohol. Do not ride with someone who has been drinking. ? When you are tired or distracted. ? While texting.  Wear a helmet and other protective equipment during sports activities.  If you have firearms in your house, make sure you follow all gun safety procedures.  Seek help if you have been physically or sexually abused. What's next?  Go to your health care provider once a year for an annual wellness visit.  Ask your health care provider how often you should have your eyes and teeth checked.  Stay up to date on all vaccines. This information is not intended to replace advice given to you by your health care provider. Make sure you discuss any questions you have with your health care provider. Document Revised: 02/15/2020 Document Reviewed: 02/28/2018 Elsevier Patient Education  2021 Elsevier Inc.  

## 2020-10-13 NOTE — Progress Notes (Signed)
HPI: Robin Robertson is a 35 y.o. female who  has a past medical history of Headache, Myasthenia gravis ([place]), Myasthenia gravis ([place]), and Post partum depression.  she presents to [facility] today, 10/13/20,  for chief complaint of: Annual physical exam  Dentist: every 6 months, no concerns Eye exam: yearly, contacts Exercise: working on increasing exercise Diet: no restrictions, eats all food groups Pap smear: last year, does with GYN COVID vaccine:  Done, not boosted  Concerns:  None  Past medical, surgical, social and family history reviewed:  Patient Active Problem List   Diagnosis Date Noted  . Women's annual routine gynecological examination 09/10/2019  . Migraine without aura and without status migrainosus, not intractable 08/05/2018  . Depression 08/05/2018  . Fatigue 06/14/2018  . Postpartum depression 02/18/2018  . Rubella non-immune status, antepartum 07/16/2017  . Myasthenia gravis ([place]) 11/24/2014    Past Surgical History:  Procedure Laterality Date  . WISDOM TOOTH EXTRACTION      Social History   Tobacco Use  . Smoking status: Never Smoker  . Smokeless tobacco: Never Used  Substance Use Topics  . Alcohol use: No    Alcohol/week: 0.0 standard drinks    Family History  Problem Relation Age of Onset  . Hypertension Father   . Cerebrovascular Disease Maternal Grandmother   . Heart disease Paternal Grandmother   . Myasthenia gravis Paternal Grandmother   . Hypertension Paternal Grandfather      Current medication list and allergy/intolerance information reviewed:    Current Outpatient Medications  Medication Sig Dispense Refill  . fluticasone (FLONASE) 50 MCG/ACT nasal spray Place 1-2 sprays into both nostrils daily. (Patient taking differently: Place 1-2 sprays into both nostrils daily as needed.) 48 mL 4  . PARoxetine (PAXIL) 20 MG tablet TAKE 1 TABLET (20 MG TOTAL) BY MOUTH DAILY. 90 tablet 3  .  pyridostigmine (MESTINON) 60 MG tablet TAKE 0.5 TABLETS (30 MG TOTAL) BY MOUTH IN THE MORNING AND AT BEDTIME. 90 tablet 0  . amoxicillin-clavulanate (AUGMENTIN) 875-125 MG tablet TAKE 1 TABLET BY MOUTH 2 (TWO) TIMES DAILY FOR 7 DAYS. (Patient taking differently: Take 1 tablet by mouth 2 (two) times daily. for 7 days) 14 tablet 0  . Norethindrone Acetate-Ethinyl Estrad-FE (LOESTRIN 24 FE) 1-20 MG-MCG(24) tablet TAKE 1 TABLET BY MOUTH DAILY. 28 tablet 0   No current facility-administered medications for this visit.    Allergies  Allergen Reactions  . Fentanyl Anaphylaxis    Stopped breathing, heart rate dropped to 8bpm  . Influenza Vaccines Shortness Of Breath    Chest pain, Left side of body went numb for 2 weeks  . Vicodin [Hydrocodone-Acetaminophen] Rash    Anger and Black outs      Review of Systems:  Constitutional:  No  fever, no chills, No recent illness, No unintentional weight changes. No significant fatigue.   HEENT: No  headache, no vision change, no hearing change, No sore throat, + sinus pressure  Cardiac: No  chest pain, No  pressure, No palpitations, No  Orthopnea  Respiratory:  + shortness of breath. No  Cough  Gastrointestinal: No  abdominal pain, No  nausea, No  vomiting,  No  blood in stool, No  diarrhea, No  constipation   Musculoskeletal: No new myalgia/arthralgia  Skin: No  Rash, No other wounds/concerning lesions  Genitourinary: No  incontinence, No  abnormal genital bleeding, No abnormal genital discharge  Hem/Onc: No  easy bruising/bleeding, No  abnormal lymph node  Endocrine:  No cold intolerance,  No heat intolerance. No polyuria/polydipsia/polyphagia   Neurologic: + weakness, No  dizziness, No  slurred speech/focal weakness/facial droop  Psychiatric: No concerns with depression, No concerns with anxiety, No sleep problems, No mood problems  Exam:  BP 98/61   Pulse 80   Temp 98.6 F (37 C)   Ht 5' 3.25" (1.607 m)   Wt 136 lb 1.6 oz (61.7 kg)    LMP 09/29/2020 (Within Days)   SpO2 97%   BMI 23.92 kg/m   Constitutional: VS see above. General Appearance: alert, well-developed, well-nourished, NAD  Eyes: Normal lids and conjunctive, non-icteric sclera  Ears, Nose, Mouth, Throat: MMM, Normal external inspection ears/nares/mouth/lips/gums. TM normal bilaterally.  Neck: No masses, trachea midline. No thyroid enlargement. No tenderness/mass appreciated. No lymphadenopathy  Respiratory: Normal respiratory effort. no wheeze, no rhonchi, no rales  Cardiovascular: S1/S2 normal, no murmur, no rub/gallop auscultated. RRR. No lower extremity edema. Pedal pulse II/IV bilaterally PT. No carotid bruit or JVD. No abdominal aortic bruit.  Gastrointestinal: Nontender, no masses. No hepatomegaly, no splenomegaly. No hernia appreciated. Bowel sounds normal. Rectal exam deferred.   Musculoskeletal: Gait normal. No clubbing/cyanosis of digits.   Neurological: Normal balance/coordination. No tremor. No cranial nerve deficit on limited exam. Motor and sensation intact and symmetric. Cerebellar reflexes intact.   Skin: warm, dry, intact. No rash/ulcer. No concerning nevi or subq nodules on limited exam.    Psychiatric: Normal judgment/insight. Normal mood and affect. Oriented x3.    No results found for this or any previous visit (from the past 72 hour(s)).  No results found.   ASSESSMENT/PLAN:   1. Annual physical exam Checking CBC with differential, CMP, today.  Up to date on preventative care. - CBC with Differential/Platelet - COMPLETE METABOLIC PANEL WITH GFR - Lipid panel  2. Uses birth control Refilling birth control to allow her to reach OB/GYN appointment without missed doses. - Norethindrone Acetate-Ethinyl Estrad-FE (LOESTRIN 24 FE) 1-20 MG-MCG(24) tablet; TAKE 1 TABLET BY MOUTH DAILY.  Dispense: 28 tablet; Refill: 0  3. Screening for endocrine disorder Checking TSH. - TSH  4. Need for hepatitis C screening test Discussed  screening recommendations.  Patient agreeable so adding to blood work today. - Hepatitis C antibody  5. Myasthenia gravis [facility]) Per patient request, referring to neurology for a different provider since she has issues with getting in at her current neurologist if needed - Ambulatory referral to Neurology  Orders Placed This Encounter  Procedures  . CBC with Differential/Platelet  . COMPLETE METABOLIC PANEL WITH GFR  . Lipid panel  . TSH  . Hepatitis C antibody  . Ambulatory referral to Neurology    Meds ordered this encounter  Medications  . Norethindrone Acetate-Ethinyl Estrad-FE (LOESTRIN 24 FE) 1-20 MG-MCG(24) tablet    Sig: TAKE 1 TABLET BY MOUTH DAILY.    Dispense:  28 tablet    Refill:  0    Order Specific Question:   Supervising Provider    Answer:   Emeterio Reeve [[id number]]    Patient Instructions   Preventive Care 28-46 Years Old, Female Preventive care refers to lifestyle choices and visits with your health care provider that can promote health and wellness. This includes:  A yearly physical exam. This is also called an annual wellness visit.  Regular dental and eye exams.  Immunizations.  Screening for certain conditions.  Healthy lifestyle choices, such as: ? Eating a healthy diet. ? Getting regular exercise. ? Not using drugs or products that contain nicotine and  tobacco. ? Limiting alcohol use. What can I expect for my preventive care visit? Physical exam Your health care provider may check your:  Height and weight. These may be used to calculate your BMI (body mass index). BMI is a measurement that tells if you are at a healthy weight.  Heart rate and blood pressure.  Body temperature.  Skin for abnormal spots. Counseling Your health care provider may ask you questions about your:  Past medical problems.  Family's medical history.  Alcohol, tobacco, and drug use.  Emotional well-being.  Home life and relationship  well-being.  Sexual activity.  Diet, exercise, and sleep habits.  Work and work Statistician.  Access to firearms.  Method of birth control.  Menstrual cycle.  Pregnancy history. What immunizations do I need? Vaccines are usually given at various ages, according to a schedule. Your health care provider will recommend vaccines for you based on your age, medical history, and lifestyle or other factors, such as travel or where you work.   What tests do I need? Blood tests  Lipid and cholesterol levels. These may be checked every 5 years starting at age 62.  Hepatitis C test.  Hepatitis B test. Screening  Diabetes screening. This is done by checking your blood sugar (glucose) after you have not eaten for a while (fasting).  STD (sexually transmitted disease) testing, if you are at risk.  BRCA-related cancer screening. This may be done if you have a family history of breast, ovarian, tubal, or peritoneal cancers.  Pelvic exam and Pap test. This may be done every 3 years starting at age 33. Starting at age 41, this may be done every 5 years if you have a Pap test in combination with an HPV test. Talk with your health care provider about your test results, treatment options, and if necessary, the need for more tests.   Follow these instructions at home: Eating and drinking  Eat a healthy diet that includes fresh fruits and vegetables, whole grains, lean protein, and low-fat dairy products.  Take vitamin and mineral supplements as recommended by your health care provider.  Do not drink alcohol if: ? Your health care provider tells you not to drink. ? You are pregnant, may be pregnant, or are planning to become pregnant.  If you drink alcohol: ? Limit how much you have to 0-1 drink a day. ? Be aware of how much alcohol is in your drink. In the [place] one drink equals one 12 oz bottle of beer (355 mL), one 5 oz glass of wine (148 mL), or one 1 oz glass of hard liquor (44 mL).    Lifestyle  Take daily care of your teeth and gums. Brush your teeth every morning and night with fluoride toothpaste. Floss one time each day.  Stay active. Exercise for at least 30 minutes 5 or more days each week.  Do not use any products that contain nicotine or tobacco, such as cigarettes, e-cigarettes, and chewing tobacco. If you need help quitting, ask your health care provider.  Do not use drugs.  If you are sexually active, practice safe sex. Use a condom or other form of protection to prevent STIs (sexually transmitted infections).  If you do not wish to become pregnant, use a form of birth control. If you plan to become pregnant, see your health care provider for a prepregnancy visit.  Find healthy ways to cope with stress, such as: ? Meditation, yoga, or listening to music. ? Journaling. ? Talking to  a trusted person. ? Spending time with friends and family. Safety  Always wear your seat belt while driving or riding in a vehicle.  Do not drive: ? If you have been drinking alcohol. Do not ride with someone who has been drinking. ? When you are tired or distracted. ? While texting.  Wear a helmet and other protective equipment during sports activities.  If you have firearms in your house, make sure you follow all gun safety procedures.  Seek help if you have been physically or sexually abused. What's next?  Go to your health care provider once a year for an annual wellness visit.  Ask your health care provider how often you should have your eyes and teeth checked.  Stay up to date on all vaccines. This information is not intended to replace advice given to you by your health care provider. Make sure you discuss any questions you have with your health care provider. Document Revised: 02/15/2020 Document Reviewed: 02/28/2018 Elsevier Patient Education  2021 [employer].  Follow-up plan: Return in about 1 year (around 10/13/2021) for annual physical examn or  sooner if needed.  Clearnce Sorrel, DNP, APRN, FNP-BC [facility]

## 2020-10-14 LAB — COMPLETE METABOLIC PANEL WITH GFR
AG Ratio: 1.6 (calc) (ref 1.0–2.5)
ALT: 10 U/L (ref 6–29)
AST: 13 U/L (ref 10–30)
Albumin: 4.5 g/dL (ref 3.6–5.1)
Alkaline phosphatase (APISO): 47 U/L (ref 31–125)
BUN: 18 mg/dL (ref 7–25)
CO2: 23 mmol/L (ref 20–32)
Calcium: 9.5 mg/dL (ref 8.6–10.2)
Chloride: 103 mmol/L (ref 98–110)
Creat: 0.62 mg/dL (ref 0.50–1.10)
GFR, Est African American: 136 mL/min/{1.73_m2} (ref >=60)
GFR, Est Non African American: 118 mL/min/{1.73_m2} (ref >=60)
Globulin: 2.8 g/dL (calc) (ref 1.9–3.7)
Glucose, Bld: 89 mg/dL (ref 65–139)
Potassium: 4 mmol/L (ref 3.5–5.3)
Sodium: 137 mmol/L (ref 135–146)
Total Bilirubin: 0.4 mg/dL (ref 0.2–1.2)
Total Protein: 7.3 g/dL (ref 6.1–8.1)

## 2020-10-14 LAB — CBC WITH DIFFERENTIAL/PLATELET
Absolute Monocytes: 490 cells/uL (ref 200–950)
Basophils Absolute: 50 cells/uL (ref 0–200)
Basophils Relative: 0.7 %
Eosinophils Absolute: 418 cells/uL (ref 15–500)
Eosinophils Relative: 5.8 %
HCT: 41.1 % (ref 35.0–45.0)
Hemoglobin: 13.3 g/dL (ref 11.7–15.5)
Lymphs Abs: 2578 cells/uL (ref 850–3900)
MCH: 29.2 pg (ref 27.0–33.0)
MCHC: 32.4 g/dL (ref 32.0–36.0)
MCV: 90.1 fL (ref 80.0–100.0)
MPV: 10.3 fL (ref 7.5–12.5)
Monocytes Relative: 6.8 %
Neutro Abs: 3665 cells/uL (ref 1500–7800)
Neutrophils Relative %: 50.9 %
Platelets: 311 10*3/uL (ref 140–400)
RBC: 4.56 10*6/uL (ref 3.80–5.10)
RDW: 12.3 % (ref 11.0–15.0)
Total Lymphocyte: 35.8 %
WBC: 7.2 10*3/uL (ref 3.8–10.8)

## 2020-10-14 LAB — HEPATITIS C ANTIBODY
Hepatitis C Ab: NONREACTIVE
SIGNAL TO CUT-OFF: 0 (ref <1.00)

## 2020-10-14 LAB — LIPID PANEL
Cholesterol: 168 mg/dL (ref <200)
HDL: 63 mg/dL (ref >=50)
LDL Cholesterol (Calc): 82 mg/dL (calc)
Non-HDL Cholesterol (Calc): 105 mg/dL (calc) (ref <130)
Total CHOL/HDL Ratio: 2.7 (calc) (ref <5.0)
Triglycerides: 132 mg/dL (ref <150)

## 2020-10-14 LAB — TSH: TSH: 0.73 mIU/L

## 2020-10-20 ENCOUNTER — Encounter: Payer: Self-pay | Admitting: Neurology

## 2020-10-25 MED FILL — Paroxetine HCl Tab 20 MG: ORAL | 30 days supply | Qty: 30 | Fill #0 | Status: AC

## 2020-10-26 ENCOUNTER — Other Ambulatory Visit (HOSPITAL_COMMUNITY): Payer: Self-pay

## 2020-10-27 ENCOUNTER — Other Ambulatory Visit (HOSPITAL_COMMUNITY): Payer: Self-pay

## 2020-10-27 ENCOUNTER — Other Ambulatory Visit: Payer: Self-pay

## 2020-10-27 ENCOUNTER — Encounter: Payer: Self-pay | Admitting: Obstetrics and Gynecology

## 2020-10-27 ENCOUNTER — Ambulatory Visit (INDEPENDENT_AMBULATORY_CARE_PROVIDER_SITE_OTHER): Payer: BC Managed Care – PPO | Admitting: Obstetrics and Gynecology

## 2020-10-27 ENCOUNTER — Other Ambulatory Visit (HOSPITAL_COMMUNITY)
Admission: RE | Admit: 2020-10-27 | Discharge: 2020-10-27 | Disposition: A | Discharge status: Home / Self Care | Payer: BC Managed Care – PPO | Source: Ambulatory Visit | Attending: Obstetrics and Gynecology | Admitting: Obstetrics and Gynecology

## 2020-10-27 VITALS — BP 99/60 | HR 70 | Resp 16 | Ht 63.0 in | Wt 133.0 lb

## 2020-10-27 DIAGNOSIS — Z01419 Encounter for gynecological examination (general) (routine) without abnormal findings: Secondary | ICD-10-CM | POA: Insufficient documentation

## 2020-10-27 DIAGNOSIS — Z789 Other specified health status: Secondary | ICD-10-CM | POA: Diagnosis not present

## 2020-10-27 DIAGNOSIS — R87610 Atypical squamous cells of undetermined significance on cytologic smear of cervix (ASC-US): Secondary | ICD-10-CM | POA: Diagnosis not present

## 2020-10-27 MED ORDER — NORETHIN ACE-ETH ESTRAD-FE 1-20 MG-MCG(24) PO TABS
1.0000 | ORAL_TABLET | Freq: Every day | ORAL | 11 refills | Status: DC
  Filled 2020-10-27 – 2020-11-22 (×2): qty 28, 28d supply, fill #0
  Filled 2020-12-23: qty 28, 28d supply, fill #1
  Filled 2021-01-19: qty 28, 28d supply, fill #2
  Filled 2021-02-18: qty 28, 28d supply, fill #3
  Filled 2021-03-15: qty 28, 28d supply, fill #4
  Filled 2021-04-14: qty 28, 28d supply, fill #5
  Filled 2021-05-12: qty 28, 28d supply, fill #6
  Filled 2021-06-13: qty 28, 28d supply, fill #7
  Filled 2021-07-11: qty 28, 28d supply, fill #8
  Filled 2021-08-10: qty 28, 28d supply, fill #9
  Filled 2021-09-08: qty 28, 28d supply, fill #10
  Filled 2021-10-03: qty 28, 28d supply, fill #11

## 2020-10-27 NOTE — Progress Notes (Signed)
GYNECOLOGY ANNUAL PREVENTATIVE CARE ENCOUNTER NOTE  History:     Robin Robertson is a 35 y.o. G1P2002 female here for a routine annual gynecologic exam.  Current complaints: none.   Denies abnormal vaginal bleeding, discharge, pelvic pain, problems with intercourse or other gynecologic concerns.    Gynecologic History Patient's last menstrual period was 09/23/2020. Contraception: OCP (estrogen/progesterone) Last Pap: 2019. Results were: normal with negative HPV Last mammogram:NA  Obstetric History OB History  Gravida Para Term Preterm AB Living  2 2 2     2   SAB IAB Ectopic Multiple Live Births        0 1    # Outcome Date GA Lbr Len/2nd Weight Sex Delivery Anes PTL Lv  2 Term 05/20/15 [redacted]w[redacted]d 14:57 / 01:52 6 lb 1.4 oz (2.761 kg) F Vag-Vacuum EPI  LIV     Birth Comments: wnl  1 Term             Past Medical History:  Diagnosis Date  . Headache   . Myasthenia gravis ([place])   . Myasthenia gravis ([place])   . Post partum depression     Past Surgical History:  Procedure Laterality Date  . WISDOM TOOTH EXTRACTION      Current Outpatient Medications on File Prior to Visit  Medication Sig Dispense Refill  . PARoxetine (PAXIL) 20 MG tablet TAKE 1 TABLET (20 MG TOTAL) BY MOUTH DAILY. 90 tablet 3  . pyridostigmine (MESTINON) 60 MG tablet TAKE 0.5 TABLETS (30 MG TOTAL) BY MOUTH IN THE MORNING AND AT BEDTIME. 90 tablet 0  . fluticasone (FLONASE) 50 MCG/ACT nasal spray Place 1-2 sprays into both nostrils daily. (Patient taking differently: Place 1-2 sprays into both nostrils daily as needed.) 48 mL 4  . Norethindrone Acetate-Ethinyl Estrad-FE (LOESTRIN 24 FE) 1-20 MG-MCG(24) tablet TAKE 1 TABLET BY MOUTH DAILY. 28 tablet 0   No current facility-administered medications on file prior to visit.    Allergies  Allergen Reactions  . Fentanyl Anaphylaxis    Stopped breathing, heart rate dropped to 8bpm  . Influenza Vaccines Shortness Of Breath    Chest pain, Left side of body went  numb for 2 weeks  . Vicodin [Hydrocodone-Acetaminophen] Rash    Anger and Black outs    Social History:  reports that she has never smoked. She has never used smokeless tobacco. She reports that she does not drink alcohol and does not use drugs.  Family History  Problem Relation Age of Onset  . Hypertension Father   . Cerebrovascular Disease Maternal Grandmother   . Heart disease Paternal Grandmother   . Myasthenia gravis Paternal Grandmother   . Hypertension Paternal Grandfather     The following portions of the patient's history were reviewed and updated as appropriate: allergies, current medications, past family history, past medical history, past social history, past surgical history and problem list.  Review of Systems Pertinent items noted in HPI and remainder of comprehensive ROS otherwise negative.  Physical Exam:  BP 99/60   Pulse 70   Resp 16   Ht 5\' 3"  (1.6 m)   Wt 133 lb (60.3 kg)   LMP 09/23/2020   Breastfeeding No   BMI 23.56 kg/m  CONSTITUTIONAL: Well-developed, well-nourished female in no acute distress.  HENT:  Normocephalic, atraumatic, External right and left ear normal.  EYES: Conjunctivae and EOM are normal. Pupils are equal, round, and reactive to light. No scleral icterus.  NECK: Normal range of motion, supple, no masses.  Normal  thyroid.  SKIN: Skin is warm and dry. No rash noted. Not diaphoretic. No erythema. No pallor. MUSCULOSKELETAL: Normal range of motion. No tenderness.  No cyanosis, clubbing, or edema. NEUROLOGIC: Alert and oriented to person, place, and time. Normal reflexes, muscle tone coordination.  PSYCHIATRIC: Normal mood and affect. Normal behavior. Normal judgment and thought content. CARDIOVASCULAR: Normal heart rate noted, regular rhythm RESPIRATORY: Clear to auscultation bilaterally. Effort and breath sounds normal, no problems with respiration noted. BREASTS: Symmetric in size. No masses, tenderness, skin changes, nipple drainage, or  lymphadenopathy bilaterally. Performed in the presence of a chaperone. ABDOMEN: Soft, no distention noted.  No tenderness, rebound or guarding.  PELVIC: Normal appearing external genitalia and urethral meatus; normal appearing vaginal mucosa and cervix.  No abnormal discharge noted.  Pap smear obtained.  Normal uterine size, no other palpable masses, no uterine or adnexal tenderness.  Performed in the presence of a chaperone.   Assessment and Plan:   1. Women's annual routine gynecological examination  - Cytology - PAP( [place])  2. Uses birth control  - Norethindrone Acetate-Ethinyl Estrad-FE (LOESTRIN 24 FE) 1-20 MG-MCG(24) tablet; TAKE 1 TABLET BY MOUTH DAILY.  Dispense: 28 tablet; Refill: 11  Will follow up results of pap smear and manage accordingly. Routine preventative health maintenance measures emphasized. Please refer to After Visit Summary for other counseling recommendations.     Dmarion Perfect, Artist Pais, [place] for [employer], [place]

## 2020-10-28 LAB — CYTOLOGY - PAP
Comment: NEGATIVE
Diagnosis: UNDETERMINED — AB
High risk HPV: NEGATIVE

## 2020-11-22 ENCOUNTER — Other Ambulatory Visit (HOSPITAL_COMMUNITY): Payer: Self-pay

## 2020-11-24 ENCOUNTER — Telehealth: Payer: BC Managed Care – PPO | Admitting: Emergency Medicine

## 2020-11-24 DIAGNOSIS — L247 Irritant contact dermatitis due to plants, except food: Secondary | ICD-10-CM | POA: Diagnosis not present

## 2020-11-24 MED ORDER — PREDNISONE 5 MG PO TABS: ORAL_TABLET | ORAL | 0 refills | Status: DC

## 2020-11-24 MED ORDER — TRIAMCINOLONE ACETONIDE 0.1 % EX CREA: 1.0000 "application " | TOPICAL_CREAM | Freq: Two times a day (BID) | CUTANEOUS | 0 refills | Status: DC | PRN

## 2020-11-24 NOTE — Progress Notes (Signed)
E Visit for Rash  We are sorry that you are not feeling well. Here is how we plan to help!  Based on what you shared with me it looks like you have contact dermatitis.  Contact dermatitis is a skin rash caused by something that touches the skin and causes irritation or inflammation.  Your skin may be red, swollen, dry, cracked, and itch.  The rash should go away in a few days but can last a few weeks.  If you get a rash, it's important to figure out what caused it so the irritant can be avoided in the future.  Prednisone 5 mg daily for 6 days (see taper instructions below)   I have also prescribed topical triamcinolone cream 0.1% that you can use 2 times daily as needed for itching.   HOME CARE:   Take cool showers and avoid direct sunlight.  Apply cool compress or wet dressings.  Take a bath in an oatmeal bath.  Sprinkle content of one Aveeno packet under running faucet with comfortably warm water.  Bathe for 15-20 minutes, 1-2 times daily.  Pat dry with a towel. Do not rub the rash.  Use hydrocortisone cream.  Take an antihistamine like Benadryl for widespread rashes that itch.  The adult dose of Benadryl is 25-50 mg by mouth 4 times daily.  Caution:  This type of medication may cause sleepiness.  Do not drink alcohol, drive, or operate dangerous machinery while taking antihistamines.  Do not take these medications if you have prostate enlargement.  Read package instructions thoroughly on all medications that you take.  GET HELP RIGHT AWAY IF:   Symptoms don't go away after treatment.  Severe itching that persists.  If you rash spreads or swells.  If you rash begins to smell.  If it blisters and opens or develops a yellow-brown crust.  You develop a fever.  You have a sore throat.  You become short of breath.  MAKE SURE YOU:  Understand these instructions. Will watch your condition. Will get help right away if you are not doing well or get worse.  Thank you for  choosing an e-visit. Your e-visit answers were reviewed by a board certified advanced clinical practitioner to complete your personal care plan. Depending upon the condition, your plan could have included both over the counter or prescription medications. Please review your pharmacy choice. Be sure that the pharmacy you have chosen is open so that you can pick up your prescription now.  If there is a problem you may message your provider in [place] to have the prescription routed to another pharmacy. Your safety is important to [place]. If you have drug allergies check your prescription carefully.  For the next 24 hours, you can use MyChart to ask questions about today's visit, request a non-urgent call back, or ask for a work or school excuse from your e-visit provider. You will get an email in the next two days asking about your experience. I hope that your e-visit has been valuable and will speed your recovery.    Approximately 5 minutes was spent documenting and reviewing patient's chart.

## 2020-11-30 ENCOUNTER — Other Ambulatory Visit: Payer: Self-pay | Admitting: Osteopathic Medicine

## 2020-11-30 ENCOUNTER — Other Ambulatory Visit (HOSPITAL_COMMUNITY): Payer: Self-pay

## 2020-11-30 DIAGNOSIS — Z8659 Personal history of other mental and behavioral disorders: Secondary | ICD-10-CM

## 2020-11-30 MED ORDER — PAROXETINE HCL 20 MG PO TABS
20.0000 mg | ORAL_TABLET | Freq: Every day | ORAL | 1 refills | Status: DC
  Filled 2020-11-30: qty 90, 90d supply, fill #0
  Filled 2021-03-02: qty 90, 90d supply, fill #1

## 2020-12-08 ENCOUNTER — Telehealth: Payer: Self-pay | Admitting: *Deleted

## 2020-12-08 NOTE — Telephone Encounter (Signed)
Left message voicemail for patient offered her earlier appointment with Robin Robertson this afternoon if that works for her she is to call back [phone] and if they are still available to make that appointment.

## 2020-12-23 ENCOUNTER — Other Ambulatory Visit (HOSPITAL_COMMUNITY): Payer: Self-pay

## 2020-12-25 ENCOUNTER — Emergency Department: Admit: 2020-12-25 | Discharge status: Absent without leave | Payer: Self-pay

## 2020-12-25 ENCOUNTER — Encounter: Payer: Self-pay | Admitting: Emergency Medicine

## 2020-12-25 ENCOUNTER — Other Ambulatory Visit: Payer: Self-pay

## 2020-12-25 ENCOUNTER — Emergency Department
Admission: EM | Admit: 2020-12-25 | Discharge: 2020-12-25 | Disposition: A | Discharge status: Home / Self Care | Payer: BC Managed Care – PPO | Source: Home / Self Care

## 2020-12-25 DIAGNOSIS — J309 Allergic rhinitis, unspecified: Secondary | ICD-10-CM | POA: Diagnosis not present

## 2020-12-25 DIAGNOSIS — J3489 Other specified disorders of nose and nasal sinuses: Secondary | ICD-10-CM | POA: Diagnosis not present

## 2020-12-25 DIAGNOSIS — R059 Cough, unspecified: Secondary | ICD-10-CM | POA: Diagnosis not present

## 2020-12-25 DIAGNOSIS — J0100 Acute maxillary sinusitis, unspecified: Secondary | ICD-10-CM | POA: Diagnosis not present

## 2020-12-25 MED ORDER — AMOXICILLIN-POT CLAVULANATE 875-125 MG PO TABS: 1.0000 | ORAL_TABLET | Freq: Two times a day (BID) | ORAL | 0 refills | Status: AC

## 2020-12-25 MED ORDER — METHYLPREDNISOLONE ACETATE 80 MG/ML IJ SUSP
80.0000 mg | Freq: Once | INTRAMUSCULAR | Status: AC
  Administered 2020-12-25: 80 mg via INTRAMUSCULAR

## 2020-12-25 MED ORDER — FEXOFENADINE HCL 180 MG PO TABS: 180.0000 mg | ORAL_TABLET | Freq: Every day | ORAL | 0 refills | Status: DC

## 2020-12-25 MED ORDER — PREDNISONE 20 MG PO TABS: ORAL_TABLET | ORAL | 0 refills | Status: DC

## 2020-12-25 NOTE — ED Provider Notes (Signed)
[facility]    CSN: [id number] Arrival date & time: 12/25/20  0845      History   Chief Complaint Chief Complaint  Patient presents with   Fever   Facial Pain    HPI Robin Robertson is a 35 y.o. female.   HPI 35 year old female presents with sinus nasal congestion, sinus pressure and left ear pain x3 days fever of 100.8 last night.  Patient is vaccinated for COVID-19.  Past Medical History:  Diagnosis Date   Headache    Myasthenia gravis ([place])    Myasthenia gravis ([place])    Post partum depression     Patient Active Problem List   Diagnosis Date Noted   Women's annual routine gynecological examination 09/10/2019   Migraine without aura and without status migrainosus, not intractable 08/05/2018   Depression 08/05/2018   Fatigue 06/14/2018   Postpartum depression 02/18/2018   Rubella non-immune status, antepartum 07/16/2017   Myasthenia gravis ([place]) 11/24/2014    Past Surgical History:  Procedure Laterality Date   WISDOM TOOTH EXTRACTION      OB History     Gravida  2   Para  2   Term  2   Preterm      AB      Living  2      SAB      IAB      Ectopic      Multiple  0   Live Births  1            Home Medications    Prior to Admission medications   Medication Sig Start Date End Date Taking? Authorizing Provider  amoxicillin-clavulanate (AUGMENTIN) 875-125 MG tablet Take 1 tablet by mouth every 12 (twelve) hours for 5 days. 12/25/20 12/30/20 Yes Eliezer Lofts, FNP  fexofenadine [facility] ALLERGY) 180 MG tablet Take 1 tablet (180 mg total) by mouth daily for 15 days. 12/25/20 01/09/21 Yes Eliezer Lofts, FNP  fluticasone (FLONASE) 50 MCG/ACT nasal spray Place 1-2 sprays into both nostrils daily. Patient taking differently: Place 1-2 sprays into both nostrils daily as needed. 12/24/19  Yes Emeterio Reeve, DO  predniSONE (DELTASONE) 20 MG tablet Take 2 tabs PO daily x 5 days. 12/25/20  Yes Eliezer Lofts, FNP  Norethindrone  Acetate-Ethinyl Estrad-FE (LOESTRIN 24 FE) 1-20 MG-MCG(24) tablet TAKE 1 TABLET BY MOUTH DAILY. 10/27/20   Rasch, Anderson Malta I, NP  PARoxetine (PAXIL) 20 MG tablet Take 1 tablet (20 mg total) by mouth daily. 11/30/20   Emeterio Reeve, DO  pyridostigmine (MESTINON) 60 MG tablet TAKE 0.5 TABLETS (30 MG TOTAL) BY MOUTH IN THE MORNING AND AT BEDTIME. 09/27/20 09/27/21  Ward Givens, NP  triamcinolone cream (KENALOG) 0.1 % Apply 1 application topically 2 (two) times daily as needed. Patient not taking: Reported on 12/25/2020 11/24/20   Noe Gens, PA-C    Family History Family History  Problem Relation Age of Onset   Hypertension Father    Cerebrovascular Disease Maternal Grandmother    Heart disease Paternal Grandmother    Myasthenia gravis Paternal Grandmother    Hypertension Paternal Grandfather     Social History Social History   Tobacco Use   Smoking status: Never   Smokeless tobacco: Never  Vaping Use   Vaping Use: Never used  Substance Use Topics   Alcohol use: No    Alcohol/week: 0.0 standard drinks   Drug use: No     Allergies   Fentanyl, Influenza vaccines, and Vicodin [hydrocodone-acetaminophen]   Review of Systems  Review of Systems  Constitutional: Negative.   HENT:  Positive for postnasal drip, sinus pressure and sinus pain.   Eyes: Negative.   Respiratory:  Positive for cough.   Cardiovascular: Negative.   Gastrointestinal: Negative.   Genitourinary: Negative.   Musculoskeletal: Negative.   Skin: Negative.   Neurological: Negative.     Physical Exam Triage Vital Signs ED Triage Vitals  Enc Vitals Group     BP 12/25/20 0922 110/72     Pulse Rate 12/25/20 0922 90     Resp 12/25/20 0922 16     Temp 12/25/20 0922 98.9 F (37.2 C)     Temp Source 12/25/20 0922 Oral     SpO2 12/25/20 0922 98 %     Weight --      Height --      Head Circumference --      Peak Flow --      Pain Score 12/25/20 0923 4     Pain Loc --      Pain Edu? --      Excl.  in [place]? --    No data found.  Updated Vital Signs BP 110/72 (BP Location: Right Arm)   Pulse 90   Temp 98.9 F (37.2 C) (Oral)   Resp 16   LMP 12/21/2020 (Exact Date)   SpO2 98%       Physical Exam Vitals and nursing note reviewed.  Constitutional:      General: She is not in acute distress.    Appearance: Normal appearance. She is normal weight. She is ill-appearing.  HENT:     Head: Normocephalic and atraumatic.     Right Ear: Tympanic membrane and ear canal normal.     Left Ear: Tympanic membrane and ear canal normal.     Ears:     Comments: Patient tube dysfunction noted bilaterally    Nose: Congestion present. No rhinorrhea.     Comments: Turbinates are erythematous    Mouth/Throat:     Mouth: Mucous membranes are moist.     Pharynx: Oropharynx is clear.     Comments: Moderate amount of clear drainage of posterior oropharynx noted Eyes:     Extraocular Movements: Extraocular movements intact.     Conjunctiva/sclera: Conjunctivae normal.     Pupils: Pupils are equal, round, and reactive to light.  Cardiovascular:     Rate and Rhythm: Normal rate and regular rhythm.     Pulses: Normal pulses.     Heart sounds: Normal heart sounds.  Pulmonary:     Effort: Pulmonary effort is normal. No respiratory distress.     Breath sounds: No wheezing, rhonchi or rales.     Comments: Frequent nonproductive cough noted on exam Musculoskeletal:        General: Normal range of motion.     Cervical back: Normal range of motion and neck supple. Tenderness present.  Lymphadenopathy:     Cervical: Cervical adenopathy present.  Skin:    General: Skin is warm and dry.  Neurological:     General: No focal deficit present.     Mental Status: She is alert and oriented to person, place, and time.  Psychiatric:        Mood and Affect: Mood normal.        Behavior: Behavior normal.     UC Treatments / Results  Labs (all labs ordered are listed, but only abnormal results are  displayed) Labs Reviewed - No data to display  EKG   Radiology  No results found.  Procedures Procedures (including critical care time)  Medications Ordered in UC Medications  methylPREDNISolone acetate (DEPO-MEDROL) injection 80 mg (80 mg Intramuscular Given 12/25/20 1000)    Initial Impression / Assessment and Plan / UC Course  I have reviewed the triage vital signs and the nursing notes.  Pertinent labs & imaging results that were available during my care of the patient were reviewed by me and considered in my medical decision making (see chart for details).    MDM: 1.  Acute maxillary sinusitis, 2.  Cough, 3.  Sinus pressure, 4.  Allergic rhinitis.  Patient discharged home, hemodynamically stable. Final Clinical Impressions(s) / UC Diagnoses   Final diagnoses:  Acute maxillary sinusitis, recurrence not specified  Sinus pressure  Allergic rhinitis, unspecified seasonality, unspecified trigger  Cough     Discharge Instructions      Advised patient to take medication as directed with food to completion.  Advised patient to take Allegra 180 mg daily for the next 5 days, then as needed.  Instructed patient not to start oral prednisone burst until tomorrow, Sunday, 12/27/2020.  Advised patient to increase daily water intake while taking these medications.     ED Prescriptions     Medication Sig Dispense Auth. Provider   amoxicillin-clavulanate (AUGMENTIN) 875-125 MG tablet Take 1 tablet by mouth every 12 (twelve) hours for 5 days. 10 tablet Eliezer Lofts, FNP   predniSONE (DELTASONE) 20 MG tablet Take 2 tabs PO daily x 5 days. 10 tablet Eliezer Lofts, FNP   fexofenadine [facility] ALLERGY) 180 MG tablet Take 1 tablet (180 mg total) by mouth daily for 15 days. 15 tablet Eliezer Lofts, FNP      PDMP not reviewed this encounter.   Eliezer Lofts, [place] 12/25/20 1022

## 2020-12-25 NOTE — ED Triage Notes (Signed)
Pt has had ongoing congestion and sinus pressure w/ left ear pain x 3 days  Fever last night - Tmax 100.8 tylenol at 0100 COVID vaccine - no booster

## 2020-12-25 NOTE — ED Notes (Signed)
Depo Medrol 80mg  given by Dortha Kern, CMA.

## 2020-12-25 NOTE — Discharge Instructions (Addendum)
Advised patient to take medication as directed with food to completion.  Advised patient to take Allegra 180 mg daily for the next 5 days, then as needed.  Instructed patient not to start oral prednisone burst until tomorrow, Sunday, 12/27/2020.  Advised patient to increase daily water intake while taking these medications.

## 2021-01-14 ENCOUNTER — Other Ambulatory Visit: Payer: Self-pay

## 2021-01-14 ENCOUNTER — Other Ambulatory Visit (HOSPITAL_COMMUNITY): Payer: Self-pay

## 2021-01-14 ENCOUNTER — Encounter: Payer: Self-pay | Admitting: Neurology

## 2021-01-14 ENCOUNTER — Ambulatory Visit: Payer: BC Managed Care – PPO | Admitting: Neurology

## 2021-01-14 VITALS — BP 111/76 | HR 84 | Ht 64.0 in | Wt 140.0 lb

## 2021-01-14 DIAGNOSIS — R059 Cough, unspecified: Secondary | ICD-10-CM

## 2021-01-14 DIAGNOSIS — G7001 Myasthenia gravis with (acute) exacerbation: Secondary | ICD-10-CM | POA: Diagnosis not present

## 2021-01-14 MED ORDER — PYRIDOSTIGMINE BROMIDE 60 MG PO TABS
ORAL_TABLET | ORAL | 3 refills | Status: DC
  Filled 2021-01-14: qty 240, 80d supply, fill #0

## 2021-01-14 NOTE — Patient Instructions (Signed)
Continue to take mestinon 60mg  at 8am and 2pm.  OK to take an extra dose as needed  CT chest with contrast  Return to clinic in 6 months

## 2021-01-14 NOTE — Progress Notes (Signed)
[facility] Clinic Note - Initial Visit   Date: 01/14/21  Robin Robertson MRN: [id number] DOB: 1985-07-14   Dear Dr. Sheppard Coil:  Thank you for your kind referral of Robin Robertson for consultation of myasthenia gravis. Although her history is well known to you, please allow [place] to reiterate it for the purpose of our medical record. The patient was accompanied to the clinic by self.    History of Present Illness: Robin Robertson is a 35 y.o. right-handed female with seropositive myasthenia gravis (2013) presenting for to establish care for MG.  She was diagnosed with MG by antibody testing in ~ 2013 when living in [place], [place].  These records are not available to review.  Fortunately, her symptoms have predominately been ocular and she has not needed anything more than mestinon 60mg  2-3 times daily.  She reports having has generalized fatigue, right eye droopiness, shortness of breath, palpitation, headaches.  She sleeps on one pillow. She tends to get a lot of URI and feels that her weakness is worse when sick.   She takes mestinon 60mg  twice daily.  She has never been on prednisone.   She had never had MG crisis, required IVIG, or plasmapheresis.   She works as an Research officer, trade [employer]. She lives with husband and two daughters.    Out-side paper records, electronic medical record, and images have been reviewed where available and summarized as:  Lab Results  Component Value Date   TSH 0.73 10/13/2020   No results found for: ESRSEDRATE, POCTSEDRATE  Past Medical History:  Diagnosis Date   Headache    Myasthenia gravis ([place])    Myasthenia gravis ([place])    Post partum depression     Past Surgical History:  Procedure Laterality Date   WISDOM TOOTH EXTRACTION       Medications:  Outpatient Encounter Medications as of 01/14/2021  Medication Sig   fluticasone (FLONASE) 50 MCG/ACT nasal spray Place 1-2 sprays into both nostrils daily. (Patient  taking differently: Place 1-2 sprays into both nostrils daily as needed.)   Norethindrone Acetate-Ethinyl Estrad-FE (LOESTRIN 24 FE) 1-20 MG-MCG(24) tablet TAKE 1 TABLET BY MOUTH DAILY.   PARoxetine (PAXIL) 20 MG tablet Take 1 tablet (20 mg total) by mouth daily.   pyridostigmine (MESTINON) 60 MG tablet TAKE 0.5 TABLETS (30 MG TOTAL) BY MOUTH IN THE MORNING AND AT BEDTIME.   fexofenadine (ALLEGRA ALLERGY) 180 MG tablet Take 1 tablet (180 mg total) by mouth daily for 15 days.   [DISCONTINUED] predniSONE (DELTASONE) 20 MG tablet Take 2 tabs PO daily x 5 days.   [DISCONTINUED] triamcinolone cream (KENALOG) 0.1 % Apply 1 application topically 2 (two) times daily as needed. (Patient not taking: Reported on 12/25/2020)   No facility-administered encounter medications on file as of 01/14/2021.    Allergies:  Allergies  Allergen Reactions   Fentanyl Anaphylaxis    Stopped breathing, heart rate dropped to 8bpm   Influenza Vaccines Shortness Of Breath    Chest pain, Left side of body went numb for 2 weeks   Vicodin [Hydrocodone-Acetaminophen] Rash    Anger and Black outs    Family History: Family History  Problem Relation Age of Onset   Hypertension Father    Cerebrovascular Disease Maternal Grandmother    Heart disease Paternal Grandmother    Myasthenia gravis Paternal Grandmother    Hypertension Paternal Grandfather     Social History: Social History   Tobacco Use   Smoking status: Never   Smokeless tobacco: Never  Vaping Use   Vaping Use: Never used  Substance Use Topics   Alcohol use: No    Alcohol/week: 0.0 standard drinks   Drug use: No   Social History   Social History Narrative   Patient drinks 4-5 cups of caffeine weekly.   Patient is right handed.   Has child, Robin Robertson.    Vital Signs:  BP 111/76   Pulse 84   Ht 5\' 4"  (1.626 m)   Wt 140 lb (63.5 kg)   LMP 12/21/2020 (Exact Date)   SpO2 97%   BMI 24.03 kg/m     Neurological Exam: MENTAL STATUS including  orientation to time, place, person, recent and remote memory, attention span and concentration, language, and fund of knowledge is normal.  Speech is not dysarthric.  CRANIAL NERVES: II:  No visual field defects.  Unremarkable fundi.   III-IV-VI: Pupils equal round and reactive to light.  Normal conjugate, extra-ocular eye movements in all directions of gaze.  No nystagmus.  Moderate right ptosis at rest without worsening with sustained upgaze.   V:  Normal facial sensation.    VII:  Normal facial symmetry and movements.   VIII:  Normal hearing and vestibular function.   IX-X:  Normal palatal movement.   XI:  Normal shoulder shrug and head rotation.   XII:  Normal tongue strength and range of motion, no deviation or fasciculation.  MOTOR:  No atrophy, fasciculations or abnormal movements.  No pronator drift.   Upper Extremity:  Right  Left  Deltoid  5/5   5/5   Biceps  5/5   5/5   Triceps  5/5   5/5   Infraspinatus 5/5  5/5  Medial pectoralis 5/5  5/5  Wrist extensors  5/5   5/5   Wrist flexors  5/5   5/5   Finger extensors  5/5   5/5   Finger flexors  5/5   5/5   Dorsal interossei  5/5   5/5   Abductor pollicis  5/5   5/5   Tone (Ashworth scale)  0  0   Lower Extremity:  Right  Left  Hip flexors  5/5   5/5   Hip extensors  5/5   5/5   Adductor 5/5  5/5  Abductor 5/5  5/5  Knee flexors  5/5   5/5   Knee extensors  5/5   5/5   Dorsiflexors  5/5   5/5   Plantarflexors  5/5   5/5   Toe extensors  5/5   5/5   Toe flexors  5/5   5/5   Tone (Ashworth scale)  0  0   MSRs:  Right        Left                  brachioradialis 2+  2+  biceps 2+  2+  triceps 2+  2+  patellar 2+  2+  ankle jerk 2+  2+  Hoffman no  no  plantar response down  down   SENSORY:  Reduced temperature and pin prick over the right lower leg and foot.  Otherwise, normal and symmetric perception of light touch, pinprick, vibration, and proprioception.  Romberg's sign absent.   COORDINATION/GAIT: Normal  finger-to- nose-finger.  Intact rapid alternating movements bilaterally.   Gait narrow based and stable. Tandem and stressed gait intact.    IMPRESSION: Seropositive myasthenia gravis, diagnosed ~2013.  Symptoms predominately are ocular, however she reports having generalized fatigue, weakness, and shortness  of breath.  Given that she has not required immunomodulatory therapy and exam does not show fatigable weakness of the limbs, I explained that her shortness of breath may not be manifestation of MG, as this tends to be a very late and symptoms of severe MG, often requiring hospitalization.  If she continues to have dyspnea, recommend seeing pulmonology.  I also explained that pseudoexacerbation in the setting of an illness is expected and the management is focused at treating her underlying infection.  PLAN/RECOMMENDATIONS:  Continue mestinon 60mg  twice at 8am and 2pm.  OK to take an extra dose as needed. Check CT chest with contrast to evaluate for residual thymic tissue  Return to clinic in 6 months.   Thank you for allowing me to participate in patient's care.  If I can answer any additional questions, I would be pleased to do so.    Sincerely,    Azoria Abbett K. Posey Pronto, DO

## 2021-01-19 ENCOUNTER — Other Ambulatory Visit (HOSPITAL_COMMUNITY): Payer: Self-pay

## 2021-02-04 ENCOUNTER — Other Ambulatory Visit: Payer: Self-pay

## 2021-02-04 ENCOUNTER — Ambulatory Visit
Admission: RE | Admit: 2021-02-04 | Discharge: 2021-02-04 | Disposition: A | Discharge status: Home / Self Care | Payer: BC Managed Care – PPO | Source: Ambulatory Visit | Attending: Neurology | Admitting: Neurology

## 2021-02-04 DIAGNOSIS — G7001 Myasthenia gravis with (acute) exacerbation: Secondary | ICD-10-CM

## 2021-02-04 DIAGNOSIS — R059 Cough, unspecified: Secondary | ICD-10-CM | POA: Diagnosis not present

## 2021-02-04 MED ORDER — IOPAMIDOL (ISOVUE-300) INJECTION 61%
75.0000 mL | Freq: Once | INTRAVENOUS | Status: AC | PRN
  Administered 2021-02-04: 75 mL via INTRAVENOUS

## 2021-02-08 ENCOUNTER — Ambulatory Visit: Payer: Self-pay | Admitting: Adult Health

## 2021-02-16 NOTE — Progress Notes (Signed)
Pt advised of her CT chest results.

## 2021-02-18 ENCOUNTER — Other Ambulatory Visit (HOSPITAL_COMMUNITY): Payer: Self-pay

## 2021-03-02 ENCOUNTER — Other Ambulatory Visit (HOSPITAL_COMMUNITY): Payer: Self-pay

## 2021-03-16 ENCOUNTER — Other Ambulatory Visit (HOSPITAL_COMMUNITY): Payer: Self-pay

## 2021-04-14 ENCOUNTER — Other Ambulatory Visit (HOSPITAL_COMMUNITY): Payer: Self-pay

## 2021-05-12 ENCOUNTER — Other Ambulatory Visit (HOSPITAL_COMMUNITY): Payer: Self-pay

## 2021-05-28 ENCOUNTER — Encounter: Payer: Self-pay | Admitting: Emergency Medicine

## 2021-05-28 ENCOUNTER — Other Ambulatory Visit: Payer: Self-pay

## 2021-05-28 ENCOUNTER — Emergency Department (INDEPENDENT_AMBULATORY_CARE_PROVIDER_SITE_OTHER)
Admission: EM | Admit: 2021-05-28 | Discharge: 2021-05-28 | Disposition: A | Discharge status: Home / Self Care | Payer: 59 | Source: Home / Self Care | Attending: Family Medicine | Admitting: Family Medicine

## 2021-05-28 ENCOUNTER — Emergency Department: Admit: 2021-05-28 | Payer: Self-pay

## 2021-05-28 DIAGNOSIS — J111 Influenza due to unidentified influenza virus with other respiratory manifestations: Secondary | ICD-10-CM | POA: Diagnosis not present

## 2021-05-28 LAB — POCT INFLUENZA A/B
Influenza A, POC: NEGATIVE
Influenza B, POC: NEGATIVE

## 2021-05-28 MED ORDER — ONDANSETRON HCL 8 MG PO TABS: 8.0000 mg | ORAL_TABLET | Freq: Three times a day (TID) | ORAL | 0 refills | Status: DC | PRN

## 2021-05-28 NOTE — ED Triage Notes (Signed)
Cough & fever since 2300 Tmax 101 last night  OTC tylenol 1 gm last night No flu vaccine

## 2021-05-28 NOTE — ED Provider Notes (Signed)
[name]    CSN: [id number] Arrival date & time: 05/28/21  0831      History   Chief Complaint Chief Complaint  Patient presents with   Cough   Fever    HPI Robin Robertson is a 35 y.o. female.   HPI  Sudden onset of cough and fever since last night.  Her temperature was up to 101.  She is very tired today.  Has not had a flu vaccine.  She does have 2 preschool children.  No known exposure to illness.  COVID test at home was negative  Past Medical History:  Diagnosis Date   Headache    Myasthenia gravis ([place])    Myasthenia gravis ([place])    Post partum depression     Patient Active Problem List   Diagnosis Date Noted   Women's annual routine gynecological examination 09/10/2019   Migraine without aura and without status migrainosus, not intractable 08/05/2018   Depression 08/05/2018   Fatigue 06/14/2018   Postpartum depression 02/18/2018   Rubella non-immune status, antepartum 07/16/2017   Myasthenia gravis ([place]) 11/24/2014    Past Surgical History:  Procedure Laterality Date   WISDOM TOOTH EXTRACTION      OB History     Gravida  2   Para  2   Term  2   Preterm      AB      Living  2      SAB      IAB      Ectopic      Multiple  0   Live Births  1            Home Medications    Prior to Admission medications   Medication Sig Start Date End Date Taking? Authorizing Provider  ondansetron (ZOFRAN) 8 MG tablet Take 1 tablet (8 mg total) by mouth every 8 (eight) hours as needed for nausea or vomiting. 05/28/21  Yes Raylene Everts, MD  fexofenadine [facility] ALLERGY) 180 MG tablet Take 1 tablet (180 mg total) by mouth daily for 15 days. 12/25/20 01/09/21  Eliezer Lofts, FNP  fluticasone (FLONASE) 50 MCG/ACT nasal spray Place 1-2 sprays into both nostrils daily. Patient taking differently: Place 1-2 sprays into both nostrils daily as needed. 12/24/19   Emeterio Reeve, DO  Norethindrone Acetate-Ethinyl Estrad-FE  (LOESTRIN 24 FE) 1-20 MG-MCG(24) tablet TAKE 1 TABLET BY MOUTH DAILY. 10/27/20   Rasch, Anderson Malta I, NP  PARoxetine (PAXIL) 20 MG tablet Take 1 tablet (20 mg total) by mouth daily. 11/30/20   Emeterio Reeve, DO  pyridostigmine (MESTINON) 60 MG tablet Take 1 tablet at 8am and 2pm.  OK to take an extra dose as needed. 01/14/21   Alda Berthold, DO    Family History Family History  Problem Relation Age of Onset   Hypertension Father    Cerebrovascular Disease Maternal Grandmother    Heart disease Paternal Grandmother    Myasthenia gravis Paternal Grandmother    Hypertension Paternal Grandfather     Social History Social History   Tobacco Use   Smoking status: Never   Smokeless tobacco: Never  Vaping Use   Vaping Use: Never used  Substance Use Topics   Alcohol use: No    Alcohol/week: 0.0 standard drinks   Drug use: No     Allergies   Fentanyl, Influenza vaccines, and Vicodin [hydrocodone-acetaminophen]   Review of Systems Review of Systems See HPI  Physical Exam Triage Vital Signs ED Triage Vitals  Enc  Vitals Group     BP 05/28/21 0927 125/76     Pulse Rate 05/28/21 0927 (!) 102     Resp 05/28/21 0927 18     Temp 05/28/21 0927 99.2 F (37.3 C)     Temp Source 05/28/21 0927 Oral     SpO2 05/28/21 0927 99 %     Weight --      Height --      Head Circumference --      Peak Flow --      Pain Score 05/28/21 0910 4     Pain Loc --      Pain Edu? --      Excl. in [place]? --    No data found.  Updated Vital Signs BP 125/76 (BP Location: Right Arm)   Pulse (!) 102   Temp 99.2 F (37.3 C) (Oral)   Resp 18   LMP 05/14/2021 (Exact Date)   SpO2 99%      Physical Exam Constitutional:      General: She is not in acute distress.    Appearance: She is well-developed. She is ill-appearing.     Comments: Appears tired.  Appears ill  HENT:     Head: Normocephalic and atraumatic.     Right Ear: Tympanic membrane normal.     Left Ear: Tympanic membrane and ear canal  normal.     Nose: Nose normal. No congestion.     Mouth/Throat:     Pharynx: No posterior oropharyngeal erythema.  Eyes:     Conjunctiva/sclera: Conjunctivae normal.     Pupils: Pupils are equal, round, and reactive to light.     Comments: Mild asymmetry of eyes  Cardiovascular:     Rate and Rhythm: Normal rate and regular rhythm.     Heart sounds: Normal heart sounds.  Pulmonary:     Effort: Pulmonary effort is normal. No respiratory distress.     Breath sounds: Normal breath sounds.  Abdominal:     General: There is no distension.     Palpations: Abdomen is soft.  Musculoskeletal:        General: Normal range of motion.     Cervical back: Normal range of motion.  Lymphadenopathy:     Cervical: No cervical adenopathy.  Skin:    General: Skin is warm and dry.  Neurological:     General: No focal deficit present.     Mental Status: She is alert.  Psychiatric:        Mood and Affect: Mood normal.        Behavior: Behavior normal.     UC Treatments / Results  Labs (all labs ordered are listed, but only abnormal results are displayed) Labs Reviewed  POCT INFLUENZA A/B    EKG   Radiology No results found.  Procedures Procedures (including critical care time)  Medications Ordered in UC Medications - No data to display  Initial Impression / Assessment and Plan / UC Course  I have reviewed the triage vital signs and the nursing notes.  Pertinent labs & imaging results that were available during my care of the patient were reviewed by me and considered in my medical decision making (see chart for details).     Influenza test is negative.  Patient had a COVID test at home that was negative.  She has another respiratory virus.  Discussed symptomatic treatment at home  Final diagnoses:  Influenza-like illness     Discharge Instructions      I have  prescribed Zofran in case you need medicine for nausea and vomiting Rest and drink lots of fluids May take  over-the-counter cough and cold medicine as needed Take Advil or Aleve for the chest wall pain and body soreness Call for problems     ED Prescriptions     Medication Sig Dispense Auth. Provider   ondansetron (ZOFRAN) 8 MG tablet Take 1 tablet (8 mg total) by mouth every 8 (eight) hours as needed for nausea or vomiting. 20 tablet Raylene Everts, MD      PDMP not reviewed this encounter.   Raylene Everts, MD 05/28/21 1013

## 2021-05-28 NOTE — Discharge Instructions (Signed)
I have prescribed Zofran in case you need medicine for nausea and vomiting Rest and drink lots of fluids May take over-the-counter cough and cold medicine as needed Take Advil or Aleve for the chest wall pain and body soreness Call for problems

## 2021-06-02 ENCOUNTER — Other Ambulatory Visit: Payer: Self-pay | Admitting: Osteopathic Medicine

## 2021-06-02 DIAGNOSIS — Z8659 Personal history of other mental and behavioral disorders: Secondary | ICD-10-CM

## 2021-06-03 ENCOUNTER — Other Ambulatory Visit (HOSPITAL_COMMUNITY): Payer: Self-pay

## 2021-06-03 MED ORDER — PAROXETINE HCL 20 MG PO TABS
20.0000 mg | ORAL_TABLET | Freq: Every day | ORAL | 0 refills | Status: DC
  Filled 2021-06-03: qty 90, 90d supply, fill #0

## 2021-06-14 ENCOUNTER — Other Ambulatory Visit (HOSPITAL_COMMUNITY): Payer: Self-pay

## 2021-06-18 ENCOUNTER — Encounter: Payer: 59 | Admitting: Nurse Practitioner

## 2021-06-18 NOTE — Progress Notes (Signed)
Appointment was for her 35 year old daughter. Told could not do that age. Could try amwell.

## 2021-07-12 ENCOUNTER — Other Ambulatory Visit (HOSPITAL_COMMUNITY): Payer: Self-pay

## 2021-07-21 NOTE — Progress Notes (Deleted)
Follow-up Visit   Date: 07/21/21   Robin Robertson MRN: [id number] DOB: 05-15-1986   Interim History: Robin Robertson is a 36 y.o. right-handed Caucasian female returning to the clinic for follow-up of seropositive myasthenia gravis (diagnosed 2013).  The patient was accompanied to the clinic by *** who also provides collateral information.    History of present illness: She was diagnosed with MG by antibody testing in ~ 2013 when living in [place], [place].  These records are not available to review.  Fortunately, her symptoms have predominately been ocular and she has not needed anything more than mestinon 60mg  2-3 times daily.  She reports having has generalized fatigue, right eye droopiness, shortness of breath, palpitation, headaches.  She sleeps on one pillow. She tends to get a lot of URI and feels that her weakness is worse when sick.   She takes mestinon 60mg  twice daily.  She has never been on prednisone.   She had never had MG crisis, required IVIG, or plasmapheresis.   She works as an Research officer, trade [employer]. She lives with husband and two daughters.   UPDATE 07/21/2021:  She is here for follow-up.  Myasthenia is ***  Medications:  Current Outpatient Medications on File Prior to Visit  Medication Sig Dispense Refill   fexofenadine (ALLEGRA ALLERGY) 180 MG tablet Take 1 tablet (180 mg total) by mouth daily for 15 days. 15 tablet 0   fluticasone (FLONASE) 50 MCG/ACT nasal spray Place 1-2 sprays into both nostrils daily. (Patient taking differently: Place 1-2 sprays into both nostrils daily as needed.) 48 mL 4   Norethindrone Acetate-Ethinyl Estrad-FE (LOESTRIN 24 FE) 1-20 MG-MCG(24) tablet TAKE 1 TABLET BY MOUTH DAILY. 28 tablet 11   ondansetron (ZOFRAN) 8 MG tablet Take 1 tablet (8 mg total) by mouth every 8 (eight) hours as needed for nausea or vomiting. 20 tablet 0   PARoxetine (PAXIL) 20 MG tablet Take 1 tablet (20 mg total) by mouth daily. 90 tablet 0   pyridostigmine  (MESTINON) 60 MG tablet Take 1 tablet at 8am and 2pm.  OK to take an extra dose as needed. 240 tablet 3   No current facility-administered medications on file prior to visit.    Allergies:  Allergies  Allergen Reactions   Fentanyl Anaphylaxis    Stopped breathing, heart rate dropped to 8bpm   Influenza Vaccines Shortness Of Breath    Chest pain, Left side of body went numb for 2 weeks   Vicodin [Hydrocodone-Acetaminophen] Rash    Anger and Black outs    Vital Signs:  There were no vitals taken for this visit.   General Medical Exam:   General:  Well appearing, comfortable  Eyes/ENT: see cranial nerve examination.   Neck:  No carotid bruits. Respiratory:  Clear to auscultation, good air entry bilaterally.   Cardiac:  Regular rate and rhythm, no murmur.   Ext:  No edema ***  Neurological Exam: MENTAL STATUS including orientation to time, place, person, recent and remote memory, attention span and concentration, language, and fund of knowledge is ***normal.  Speech is not dysarthric.  CRANIAL NERVES:  No visual field defects.  Pupils equal round and reactive to light.  Normal conjugate, extra-ocular eye movements in all directions of gaze.  No ptosis ***.  Face is symmetric. Palate elevates symmetrically.  Tongue is midline.  MOTOR:  Motor strength is 5/5 in all extremities, ***.  No atrophy, fasciculations or abnormal movements.  No pronator drift.  Tone is normal.  MSRs:  Reflexes are 2+/4 throughout ***.  SENSORY:  Intact to vibration throughout ***.  COORDINATION/GAIT:  Normal finger-to- nose-finger.  Intact rapid alternating movements bilaterally.  Gait narrow based and stable.   Data:*** CT chest w contrast 02/06/2021:  No thymoma  IMPRESSION/PLAN: Seropositive myasthenia gravis, thymoma negative, dx 2013.  Symptoms predominately ocular.  Never needed prednisone, PLEX, or IVIG.  - Continue mestinon 60mg  twice daily at 8a and 2p.  OK to take extra dose as  needed  PLAN/RECOMMENDATIONS:  ***  Return to clinic in ***.   Total time spent:  ***  Thank you for allowing me to participate in patient's care.  If I can answer any additional questions, I would be pleased to do so.    Sincerely,    Lilliah Priego K. Posey Pronto, DO

## 2021-07-22 ENCOUNTER — Ambulatory Visit: Payer: 59 | Admitting: Neurology

## 2021-08-10 ENCOUNTER — Other Ambulatory Visit (HOSPITAL_COMMUNITY): Payer: Self-pay

## 2021-08-30 ENCOUNTER — Other Ambulatory Visit (HOSPITAL_COMMUNITY): Payer: Self-pay

## 2021-08-30 ENCOUNTER — Telehealth: Payer: Self-pay | Admitting: Medical-Surgical

## 2021-08-30 DIAGNOSIS — Z8659 Personal history of other mental and behavioral disorders: Secondary | ICD-10-CM

## 2021-08-30 MED ORDER — PAROXETINE HCL 20 MG PO TABS
20.0000 mg | ORAL_TABLET | Freq: Every day | ORAL | 0 refills | Status: DC
  Filled 2021-08-30: qty 90, 90d supply, fill #0

## 2021-08-30 NOTE — Telephone Encounter (Signed)
Refilled

## 2021-08-30 NOTE — Telephone Encounter (Signed)
Pt is scheduling a transfer of care appt for late April. She is in need of refill on [facility]. Info is correct

## 2021-08-30 NOTE — Telephone Encounter (Signed)
Patient aware the refill was sent to the pharmacy. She was also instructed to make and keep the [facility] appt so we may continue to fill her meds.

## 2021-09-08 ENCOUNTER — Other Ambulatory Visit (HOSPITAL_COMMUNITY): Payer: Self-pay

## 2021-09-23 ENCOUNTER — Other Ambulatory Visit: Payer: Self-pay

## 2021-09-23 ENCOUNTER — Encounter: Payer: Self-pay | Admitting: Neurology

## 2021-09-23 ENCOUNTER — Other Ambulatory Visit (HOSPITAL_COMMUNITY): Payer: Self-pay

## 2021-09-23 ENCOUNTER — Ambulatory Visit (INDEPENDENT_AMBULATORY_CARE_PROVIDER_SITE_OTHER): Payer: 59 | Admitting: Neurology

## 2021-09-23 VITALS — BP 106/64 | HR 69 | Ht 64.0 in | Wt 143.0 lb

## 2021-09-23 DIAGNOSIS — G7001 Myasthenia gravis with (acute) exacerbation: Secondary | ICD-10-CM

## 2021-09-23 MED ORDER — PYRIDOSTIGMINE BROMIDE 60 MG PO TABS
ORAL_TABLET | ORAL | 3 refills | Status: DC
  Filled 2021-09-23: qty 90, 30d supply, fill #0
  Filled 2021-12-26: qty 90, 30d supply, fill #1
  Filled 2022-09-01: qty 90, 30d supply, fill #2

## 2021-09-23 NOTE — Patient Instructions (Signed)
Increase mestinon to '60mg'$  three times daily at 8a, noon, and 5p ? ?Return to clinic in 6 months ?

## 2021-09-23 NOTE — Progress Notes (Signed)
? ? ?Follow-up Visit ? ? ?Date: 09/23/21 ? ? ?Robin Robertson ?MRN: [id number] ?DOB: 12/04/1985 ? ? ?Interim History: ?Robin Robertson is a 36 y.o. right-handed Caucasian female with seropositive gravis (2013) returning to the clinic for follow-up of myasthenia gravis.  The patient was accompanied to the clinic by self. ? ?History of present illness: ?She was diagnosed with MG by antibody testing in ~ 2013 when living in [place], [place].  These records are not available to review.  Fortunately, her symptoms have predominately been ocular and she has not needed anything more than mestinon '60mg'$  2-3 times daily.  She reports having has generalized fatigue, right eye droopiness, shortness of breath, palpitation, headaches.  She sleeps on one pillow. She tends to get a lot of URI and feels that her weakness is worse when sick.  ?  ?She takes mestinon '60mg'$  twice daily.  She has never been on prednisone.  ?  ?She had never had MG crisis, required IVIG, or plasmapheresis.  ? ?UPDATE 09/23/2021:  She is here for 6 month visit. Overall, her MG is well-controlled. She complains of arm heaviness where she is unable to blow dry her hair, but does not have difficulty with other ADLs.  She has noticed that usually at the end of her work-day, she has difficulty focusing with her eyes, denies double vision.  No problems with speech or swallow. She continues to have generalized fatigue and intermittent shortness of breath. CT chest was negative for thymoma.  ? ?Medications:  ?Current Outpatient Medications on File Prior to Visit  ?Medication Sig Dispense Refill  ? fexofenadine (ALLEGRA ALLERGY) 180 MG tablet Take 1 tablet (180 mg total) by mouth daily for 15 days. 15 tablet 0  ? fluticasone (FLONASE) 50 MCG/ACT nasal spray Place 1-2 sprays into both nostrils daily. (Patient taking differently: Place 1-2 sprays into both nostrils daily as needed.) 48 mL 4  ? Norethindrone Acetate-Ethinyl Estrad-FE (BLISOVI 24 FE) 1-20 MG-MCG(24)  tablet Take 1 tablet by mouth daily.    ? PARoxetine (PAXIL) 20 MG tablet Take 1 tablet (20 mg total) by mouth daily. 90 tablet 0  ? pyridostigmine (MESTINON) 60 MG tablet Take 1 tablet at 8am and 2pm.  OK to take an extra dose as needed. 240 tablet 3  ? Norethindrone Acetate-Ethinyl Estrad-FE (LOESTRIN 24 FE) 1-20 MG-MCG(24) tablet TAKE 1 TABLET BY MOUTH DAILY. (Patient not taking: Reported on 09/23/2021) 28 tablet 11  ? ondansetron (ZOFRAN) 8 MG tablet Take 1 tablet (8 mg total) by mouth every 8 (eight) hours as needed for nausea or vomiting. (Patient not taking: Reported on 09/23/2021) 20 tablet 0  ? ?No current facility-administered medications on file prior to visit.  ? ? ?Allergies:  ?Allergies  ?Allergen Reactions  ? Fentanyl Anaphylaxis  ?  Stopped breathing, heart rate dropped to 8bpm  ? Influenza Vaccines Shortness Of Breath  ?  Chest pain, Left side of body went numb for 2 weeks  ? Vicodin [Hydrocodone-Acetaminophen] Rash  ?  Anger and Black outs  ? ? ?Vital Signs:  ?BP 106/64   Pulse 69   Ht '5\' 4"'$  (1.626 m)   Wt 143 lb (64.9 kg)   SpO2 99%   BMI 24.55 kg/m?  ? ? ?Neurological Exam: ?MENTAL STATUS including orientation to time, place, person, recent and remote memory, attention span and concentration, language, and fund of knowledge is normal.  Speech is not dysarthric. ? ?CRANIAL NERVES:  No visual field defects.  Pupils equal round and reactive  to light.  Normal conjugate, extra-ocular eye movements in all directions of gaze.  Mild-moderate right ptosis at baseline, no worsening with sustained upgaze.  Face is symmetric and muscles are 5/5. Palate elevates symmetrically.  Tongue is midline. ? ?MOTOR:  Motor strength is 5/5 in all extremities, mild give-way weakness throughout.  No atrophy, fasciculations or abnormal movements.  No pronator drift.  Tone is normal.   ? ?COORDINATION/GAIT:  Normal finger-to- nose-finger.  She is able to stand up from chair without using arms.   Gait narrow based and  stable.  ? ?Data: ?CT chest w contrast 02/06/2021:  No thymoma ? ?IMPRESSION/PLAN: ?Seropositive myasthenia gravis with exacerbation, diagnosed 2013, predominately ocular.  Exam shows right ptosis (stable), no other fatigable weakness.  I am not convinced that her shortness of breath or arm weakness is due to MG exacerbation. ? ?PLAN/RECOMMENDATIONS:  ?Increase mestinon to '60mg'$  three times daily (8a, noon, and 5pm) ?If there is any exacerbation during the summer, the next step would be prednisone '20mg'$  daily ? ?Return to clinic in 6 months ? ?Thank you for allowing me to participate in patient's care.  If I can answer any additional questions, I would be pleased to do so.   ? ?Sincerely, ? ? ? ?Lorece Keach K. Posey Pronto, DO ? ? ?

## 2021-10-04 ENCOUNTER — Other Ambulatory Visit (HOSPITAL_COMMUNITY): Payer: Self-pay

## 2021-11-01 ENCOUNTER — Other Ambulatory Visit: Payer: Self-pay | Admitting: Medical-Surgical

## 2021-11-01 ENCOUNTER — Other Ambulatory Visit (HOSPITAL_COMMUNITY): Payer: Self-pay

## 2021-11-01 DIAGNOSIS — Z789 Other specified health status: Secondary | ICD-10-CM

## 2021-11-03 ENCOUNTER — Other Ambulatory Visit (HOSPITAL_COMMUNITY): Payer: Self-pay

## 2021-11-04 ENCOUNTER — Encounter: Payer: Self-pay | Admitting: Medical-Surgical

## 2021-11-04 ENCOUNTER — Other Ambulatory Visit (HOSPITAL_COMMUNITY): Payer: Self-pay

## 2021-11-04 ENCOUNTER — Other Ambulatory Visit (HOSPITAL_BASED_OUTPATIENT_CLINIC_OR_DEPARTMENT_OTHER): Payer: Self-pay

## 2021-11-04 ENCOUNTER — Ambulatory Visit (INDEPENDENT_AMBULATORY_CARE_PROVIDER_SITE_OTHER): Payer: 59 | Admitting: Medical-Surgical

## 2021-11-04 VITALS — BP 113/70 | HR 77 | Resp 20 | Ht 64.0 in | Wt 147.8 lb

## 2021-11-04 DIAGNOSIS — F32A Depression, unspecified: Secondary | ICD-10-CM | POA: Diagnosis not present

## 2021-11-04 DIAGNOSIS — Z1329 Encounter for screening for other suspected endocrine disorder: Secondary | ICD-10-CM

## 2021-11-04 DIAGNOSIS — G7000 Myasthenia gravis without (acute) exacerbation: Secondary | ICD-10-CM

## 2021-11-04 DIAGNOSIS — Z0000 Encounter for general adult medical examination without abnormal findings: Secondary | ICD-10-CM

## 2021-11-04 DIAGNOSIS — G43009 Migraine without aura, not intractable, without status migrainosus: Secondary | ICD-10-CM

## 2021-11-04 DIAGNOSIS — Z7689 Persons encountering health services in other specified circumstances: Secondary | ICD-10-CM

## 2021-11-04 MED ORDER — BLISOVI 24 FE 1-20 MG-MCG(24) PO TABS
1.0000 | ORAL_TABLET | Freq: Every day | ORAL | 0 refills | Status: DC
  Filled 2021-11-04: qty 28, 28d supply, fill #0

## 2021-11-04 MED ORDER — ALBUTEROL SULFATE HFA 108 (90 BASE) MCG/ACT IN AERS
2.0000 | INHALATION_SPRAY | Freq: Four times a day (QID) | RESPIRATORY_TRACT | 5 refills | Status: DC | PRN
  Filled 2021-11-04: qty 8.5, 25d supply, fill #0

## 2021-11-04 NOTE — Patient Instructions (Addendum)
Macro diet ?Reverse dieting  ? ?Weight loss tips and tricks: ? ?1.  Make sure to drink at least 64 ounces of water every day. ?2.  Avoid alcohol. ?3.  Avoid eating within 3 hours of going to bed. ?4.  Cut out sugary drinks such as sweet tea, regular sodas, energy drinks, etc. ?5.  Make sure you are getting enough sleep every night. ?6.  Start making changes by cutting back portion sizes by 1/3. ?7.  Keep a food diary to help identify areas for improvement and promote awareness of bad habits. ?8.  Increase your activity.  Choose something you like to do that is fun for you so you are more likely to stick with it. ?9.  Do not forget your protein! ?10.  Measure your neck, upper arms, waist, hips, and thighs and write those measurements down somewhere before you start your weight loss journey.  Changes in your measurements will tell a far more accurate story than the number on a scale. ?11.  As you go, pay attention to how your clothes fit! ?12.  Weigh yourself as often or as little as you need to.  Some folks do better weighing every day while others do better with once a week. ?13.  Do not get discouraged!  Weight loss efforts are meant to be lifestyle changes.  Once you stop a medication (if you are taking 1), your behaviors and habits will determine if you maintain your weight loss or not. ? ?For those on medications: ? ?1.  Take your medication as prescribed every day first thing in the morning. ?2.  Common side effects include nausea and constipation.  To combat this, increased your daily water consumption.  Consider adding in a stool softener (available OTC) if needed.  Also increase fiber intake with vegetables and fruits. ?3.  If you have any side effects or concerns while taking medication, please do not hesitate to reach out to [place] here at the office. ?4.  While on weight loss medications, we do require you to follow-up every 4 weeks with an in office visit.  Refills will not be called in early on controlled  substances. ? ?Good luck on your weight loss journey!  Have faith in your self and you will reach your goals! ? ?

## 2021-11-04 NOTE — Progress Notes (Signed)
HPI: ?Robin Robertson is a 36 y.o. female who  has a past medical history of Headache, Myasthenia gravis ([place]), Myasthenia gravis ([place]), and Post partum depression. ? ?she presents to [facility] today, 11/04/21,  ?for chief complaint of: ?Annual physical exam ?Transfer of care ? ?Dentist: UTD, no concerns ?Eye exam: UTD, no concerns ?Exercise: none intentional (r/t MG symptoms) ?Diet: no restrictions ?Pap smear: Sees OB/GYN, ASCUS negative HPV on 10/2020 ?COVID vaccine: done, no booster ? ?Concerns: ?MG- followed by Neurology. Having some worsening MG symptoms such as shortness of breath, muscle weakness, fatigue. Unsure if the breathing is related to asthma/allergies or MG. Recently increased mestinon dose to TID.  ? ?Notes the last 6 months she has been excessively flatulent. No relation to dietary habits. + bloating.  ? ?Past medical, surgical, social and family history reviewed: ? ?Patient Active Problem List  ? Diagnosis Date Noted  ? Women's annual routine gynecological examination 09/10/2019  ? Migraine without aura and without status migrainosus, not intractable 08/05/2018  ? Depression 08/05/2018  ? Fatigue 06/14/2018  ? Postpartum depression 02/18/2018  ? Rubella non-immune status, antepartum 07/16/2017  ? Myasthenia gravis ([place]) 11/24/2014  ? ? ?Past Surgical History:  ?Procedure Laterality Date  ? WISDOM TOOTH EXTRACTION    ? ? ?Social History  ? ?Tobacco Use  ? Smoking status: Never  ? Smokeless tobacco: Never  ?Substance Use Topics  ? Alcohol use: Yes  ?  Comment: Social Drinking  ? ? ?Family History  ?Problem Relation Age of Onset  ? Hypertension Father   ? Cerebrovascular Disease Maternal Grandmother   ? Heart disease Paternal Grandmother   ? Myasthenia gravis Paternal Grandmother   ? Hypertension Paternal Grandfather   ?  ? ?Current medication list and allergy/intolerance information reviewed:   ? ?Current Outpatient Medications  ?Medication Sig Dispense Refill   ? albuterol (VENTOLIN HFA) 108 (90 Base) MCG/ACT inhaler Inhale 2 puffs by mouth into the lungs every 6 (six) hours as needed for wheezing. 8.5 g 5  ? fluticasone (FLONASE) 50 MCG/ACT nasal spray Place 1-2 sprays into both nostrils daily. (Patient taking differently: Place 1-2 sprays into both nostrils daily as needed.) 48 mL 4  ? PARoxetine (PAXIL) 20 MG tablet Take 1 tablet (20 mg total) by mouth daily. 90 tablet 0  ? pyridostigmine (MESTINON) 60 MG tablet 1 tablet (60 mg total) in the morning at 8am and 1 tablet (60 mg total) daily at 12 noon AND 1 tablet (60 mg total) every evening at 5pm 360 tablet 3  ? fexofenadine (ALLEGRA ALLERGY) 180 MG tablet Take 1 tablet (180 mg total) by mouth daily for 15 days. 15 tablet 0  ? Norethindrone Acetate-Ethinyl Estrad-FE (BLISOVI 24 FE) 1-20 MG-MCG(24) tablet Take 1 tablet by mouth daily. 28 tablet 0  ? ?No current facility-administered medications for this visit.  ? ? ?Allergies  ?Allergen Reactions  ? Fentanyl Anaphylaxis  ?  Stopped breathing, heart rate dropped to 8bpm  ? Influenza Vaccines Shortness Of Breath  ?  Chest pain, Left side of body went numb for 2 weeks  ? Vicodin [Hydrocodone-Acetaminophen] Rash  ?  Anger and Black outs  ? ?  ? ?Review of Systems: ?Constitutional:  No  fever, no chills, No recent illness, + weight gain. + significant fatigue.  ?HEENT: No  headache, no vision change, no hearing change, No sore throat, + sinus pressure ?Cardiac: No  chest pain, No  pressure, No palpitations, No  Orthopnea ?Respiratory:  +  shortness of breath. No  Cough ?Gastrointestinal: No  abdominal pain, No  nausea, No  vomiting,  No  blood in stool, No  diarrhea, No  constipation  ?Musculoskeletal: No new myalgia/arthralgia ?Skin: No  Rash, No other wounds/concerning lesions ?Genitourinary: No  incontinence, No  abnormal genital bleeding, No abnormal genital discharge ?Hem/Onc: No  easy bruising/bleeding, No  abnormal lymph node ?Endocrine: No cold intolerance,  No heat  intolerance. No polyuria/polydipsia/polyphagia  ?Neurologic: No  weakness, No  dizziness, + weakness , - slurred speech/facial droop ?Psychiatric: No  concerns with depression, No  concerns with anxiety, No sleep problems, No mood problems ? ?Exam:  ?BP 113/70 (BP Location: Right Arm, Cuff Size: Normal)   Pulse 77   Resp 20   Ht '5\' 4"'$  (1.626 m)   Wt 147 lb 12.8 oz (67 kg)   SpO2 98%   BMI 25.37 kg/m?  ?Constitutional: VS see above. General Appearance: alert, well-developed, well-nourished, NAD ?Eyes: Normal lids and conjunctive, non-icteric sclera ?Ears, Nose, Mouth, Throat: MMM, Normal external inspection ears/nares/mouth/lips/gums. TM normal bilaterally. Pharynx/tonsils no erythema, no exudate. Nasal mucosa normal.  ?Neck: No masses, trachea midline. No thyroid enlargement. No tenderness/mass appreciated. No lymphadenopathy ?Respiratory: Normal respiratory effort. no wheeze, no rhonchi, no rales ?Cardiovascular: S1/S2 normal, no murmur, no rub/gallop auscultated. RRR. No lower extremity edema. Pedal pulse II/IV bilaterally PT. No carotid bruit or JVD. No abdominal aortic bruit. ?Gastrointestinal: Nontender, no masses. No hepatomegaly, no splenomegaly. No hernia appreciated. Bowel sounds normal. Rectal exam deferred.  ?Musculoskeletal: Gait normal. No clubbing/cyanosis of digits.  ?Neurological: Normal balance/coordination. No tremor. No cranial nerve deficit on limited exam. Motor and sensation intact and symmetric. Cerebellar reflexes intact.  ?Skin: warm, dry, intact. No rash/ulcer. No concerning nevi or subq nodules on limited exam.   ?Psychiatric: Normal judgment/insight. Normal mood and affect. Oriented x3.  ? ? ?ASSESSMENT/PLAN:  ? ?1. Encounter to establish care ?Reviewed available information and discussed care concerns with patient.  ? ?2. Annual physical exam ?Checking labs as below.  Up-to-date on preventative care.  Wellness information provided with AVS. ?- TSH ?- Lipid panel ?- COMPLETE  METABOLIC PANEL WITH GFR ?- CBC with Differential/Platelet ? ?3. Migraine without aura and without status migrainosus, not intractable ?Stable, no concerns. ? ?4. Myasthenia gravis ([place]) ?Followed by neurology. ? ?5. Depression, unspecified depression type ?Stable.  Continue Paxil 20 mg daily. ? ?6. Thyroid disorder screen ?Checking TSH. ?- TSH ? ?Orders Placed This Encounter  ?Procedures  ? TSH  ? Lipid panel  ? COMPLETE METABOLIC PANEL WITH GFR  ? CBC with Differential/Platelet  ? ? ?Meds ordered this encounter  ?Medications  ? Norethindrone Acetate-Ethinyl Estrad-FE (BLISOVI 24 FE) 1-20 MG-MCG(24) tablet  ?  Sig: Take 1 tablet by mouth daily.  ?  Dispense:  28 tablet  ?  Refill:  0  ? albuterol (VENTOLIN HFA) 108 (90 Base) MCG/ACT inhaler  ?  Sig: Inhale 2 puffs by mouth into the lungs every 6 (six) hours as needed for wheezing.  ?  Dispense:  8.5 g  ?  Refill:  5  ?  Please use generic pro-air  ?  Order Specific Question:   Supervising Provider  ?  Answer:   MATTHEWS, CODY [[id number]]  ? ? ?Patient Instructions  ?Macro diet ?Reverse dieting  ? ?Weight loss tips and tricks: ? ?1.  Make sure to drink at least 64 ounces of water every day. ?2.  Avoid alcohol. ?3.  Avoid eating within 3 hours of going  to bed. ?4.  Cut out sugary drinks such as sweet tea, regular sodas, energy drinks, etc. ?5.  Make sure you are getting enough sleep every night. ?6.  Start making changes by cutting back portion sizes by 1/3. ?7.  Keep a food diary to help identify areas for improvement and promote awareness of bad habits. ?8.  Increase your activity.  Choose something you like to do that is fun for you so you are more likely to stick with it. ?9.  Do not forget your protein! ?10.  Measure your neck, upper arms, waist, hips, and thighs and write those measurements down somewhere before you start your weight loss journey.  Changes in your measurements will tell a far more accurate story than the number on a scale. ?11.  As you go, pay  attention to how your clothes fit! ?12.  Weigh yourself as often or as little as you need to.  Some folks do better weighing every day while others do better with once a week. ?13.  Do not get discouraged!  Weight loss

## 2021-11-05 LAB — LIPID PANEL
Cholesterol: 155 mg/dL (ref <200)
HDL: 63 mg/dL (ref >=50)
LDL Cholesterol (Calc): 67 mg/dL (calc)
Non-HDL Cholesterol (Calc): 92 mg/dL (calc) (ref <130)
Total CHOL/HDL Ratio: 2.5 (calc) (ref <5.0)
Triglycerides: 173 mg/dL — ABNORMAL HIGH (ref <150)

## 2021-11-05 LAB — CBC WITH DIFFERENTIAL/PLATELET
Absolute Monocytes: 479 cells/uL (ref 200–950)
Basophils Absolute: 38 cells/uL (ref 0–200)
Basophils Relative: 0.5 %
Eosinophils Absolute: 213 cells/uL (ref 15–500)
Eosinophils Relative: 2.8 %
HCT: 39.9 % (ref 35.0–45.0)
Hemoglobin: 13.2 g/dL (ref 11.7–15.5)
Lymphs Abs: 2060 cells/uL (ref 850–3900)
MCH: 30.1 pg (ref 27.0–33.0)
MCHC: 33.1 g/dL (ref 32.0–36.0)
MCV: 91.1 fL (ref 80.0–100.0)
MPV: 10.2 fL (ref 7.5–12.5)
Monocytes Relative: 6.3 %
Neutro Abs: 4811 cells/uL (ref 1500–7800)
Neutrophils Relative %: 63.3 %
Platelets: 292 10*3/uL (ref 140–400)
RBC: 4.38 10*6/uL (ref 3.80–5.10)
RDW: 12.1 % (ref 11.0–15.0)
Total Lymphocyte: 27.1 %
WBC: 7.6 10*3/uL (ref 3.8–10.8)

## 2021-11-05 LAB — COMPLETE METABOLIC PANEL WITH GFR
AG Ratio: 1.9 (calc) (ref 1.0–2.5)
ALT: 18 U/L (ref 6–29)
AST: 16 U/L (ref 10–30)
Albumin: 4.3 g/dL (ref 3.6–5.1)
Alkaline phosphatase (APISO): 47 U/L (ref 31–125)
BUN: 15 mg/dL (ref 7–25)
CO2: 26 mmol/L (ref 20–32)
Calcium: 9.3 mg/dL (ref 8.6–10.2)
Chloride: 106 mmol/L (ref 98–110)
Creat: 0.65 mg/dL (ref 0.50–0.97)
Globulin: 2.3 g/dL (calc) (ref 1.9–3.7)
Glucose, Bld: 72 mg/dL (ref 65–139)
Potassium: 4.2 mmol/L (ref 3.5–5.3)
Sodium: 141 mmol/L (ref 135–146)
Total Bilirubin: 0.3 mg/dL (ref 0.2–1.2)
Total Protein: 6.6 g/dL (ref 6.1–8.1)
eGFR: 117 mL/min/{1.73_m2} (ref >=60)

## 2021-11-05 LAB — TSH: TSH: 0.68 mIU/L

## 2021-12-02 ENCOUNTER — Other Ambulatory Visit (HOSPITAL_COMMUNITY): Payer: Self-pay

## 2021-12-02 ENCOUNTER — Other Ambulatory Visit: Payer: Self-pay | Admitting: Medical-Surgical

## 2021-12-02 DIAGNOSIS — Z8659 Personal history of other mental and behavioral disorders: Secondary | ICD-10-CM

## 2021-12-05 ENCOUNTER — Encounter: Payer: Self-pay | Admitting: Medical-Surgical

## 2021-12-05 DIAGNOSIS — Z8659 Personal history of other mental and behavioral disorders: Secondary | ICD-10-CM

## 2021-12-06 ENCOUNTER — Other Ambulatory Visit (HOSPITAL_COMMUNITY): Payer: Self-pay

## 2021-12-06 MED ORDER — PAROXETINE HCL 20 MG PO TABS
20.0000 mg | ORAL_TABLET | Freq: Every day | ORAL | 1 refills | Status: DC
  Filled 2021-12-06: qty 90, 90d supply, fill #0
  Filled 2022-03-05: qty 90, 90d supply, fill #1

## 2021-12-06 MED ORDER — BLISOVI 24 FE 1-20 MG-MCG(24) PO TABS
1.0000 | ORAL_TABLET | Freq: Every day | ORAL | 3 refills | Status: DC
  Filled 2021-12-06: qty 84, 84d supply, fill #0

## 2021-12-06 MED ORDER — BLISOVI 24 FE 1-20 MG-MCG(24) PO TABS
1.0000 | ORAL_TABLET | Freq: Every day | ORAL | 3 refills | Status: DC
  Filled 2021-12-06: qty 28, 28d supply, fill #0
  Filled 2021-12-26: qty 28, 28d supply, fill #1
  Filled 2022-01-26: qty 28, 28d supply, fill #2
  Filled 2022-02-24: qty 28, 28d supply, fill #3

## 2021-12-06 MED ORDER — PAROXETINE HCL 20 MG PO TABS
20.0000 mg | ORAL_TABLET | Freq: Every day | ORAL | 1 refills | Status: DC
  Filled 2021-12-06: qty 90, 90d supply, fill #0

## 2021-12-26 ENCOUNTER — Other Ambulatory Visit (HOSPITAL_COMMUNITY): Payer: Self-pay

## 2021-12-26 ENCOUNTER — Telehealth: Payer: 59 | Admitting: Physician Assistant

## 2021-12-26 DIAGNOSIS — L237 Allergic contact dermatitis due to plants, except food: Secondary | ICD-10-CM

## 2021-12-26 MED ORDER — PREDNISONE 10 MG PO TABS
ORAL_TABLET | ORAL | 0 refills | Status: AC
  Filled 2021-12-26: qty 37, 14d supply, fill #0

## 2022-01-27 ENCOUNTER — Other Ambulatory Visit (HOSPITAL_BASED_OUTPATIENT_CLINIC_OR_DEPARTMENT_OTHER): Payer: Self-pay

## 2022-02-02 ENCOUNTER — Ambulatory Visit
Admission: RE | Admit: 2022-02-02 | Discharge: 2022-02-02 | Disposition: A | Discharge status: Home / Self Care | Payer: 59 | Source: Ambulatory Visit | Attending: Family Medicine | Admitting: Family Medicine

## 2022-02-02 VITALS — BP 110/74 | HR 66 | Temp 98.7°F | Resp 16

## 2022-02-02 DIAGNOSIS — L237 Allergic contact dermatitis due to plants, except food: Secondary | ICD-10-CM | POA: Diagnosis not present

## 2022-02-02 MED ORDER — PREDNISONE 10 MG (21) PO TBPK: ORAL_TABLET | Freq: Every day | ORAL | 0 refills | Status: DC

## 2022-02-02 MED ORDER — METHYLPREDNISOLONE SODIUM SUCC 125 MG IJ SOLR
125.0000 mg | Freq: Once | INTRAMUSCULAR | Status: AC
  Administered 2022-02-02: 125 mg via INTRAMUSCULAR

## 2022-02-02 NOTE — ED Triage Notes (Signed)
Patient presents to Urgent Care with complaints of poison ivy rash located on right arm and spreading to left arm since Saturday.   Treating rash with hydrocortisone and calamine lotion.

## 2022-02-02 NOTE — ED Provider Notes (Signed)
[facility]    CSN: [id number] Arrival date & time: 02/02/22  1732      History   Chief Complaint Chief Complaint  Patient presents with   Poison Ivy    Large rash - Entered by patient    HPI Robin Robertson is a 36 y.o. female.   HPI Very pleasant 36 year old female presents with poison ivy rash located on right arm and spreading to left arm since Saturday.  Patient has tried OTC hydrocortisone cream and calamine lotion with little to no relief.  PMH significant for myasthenia gravis, headache, and depression.  Patient reports taking prednisone previously for poison ivy dermatitis with no adverse reactions.  Past Medical History:  Diagnosis Date   Headache    Myasthenia gravis ([place])    Myasthenia gravis ([place])    Post partum depression     Patient Active Problem List   Diagnosis Date Noted   Women's annual routine gynecological examination 09/10/2019   Migraine without aura and without status migrainosus, not intractable 08/05/2018   Depression 08/05/2018   Fatigue 06/14/2018   Postpartum depression 02/18/2018   Rubella non-immune status, antepartum 07/16/2017   Myasthenia gravis ([place]) 11/24/2014    Past Surgical History:  Procedure Laterality Date   WISDOM TOOTH EXTRACTION      OB History     Gravida  2   Para  2   Term  2   Preterm      AB      Living  2      SAB      IAB      Ectopic      Multiple  0   Live Births  1            Home Medications    Prior to Admission medications   Medication Sig Start Date End Date Taking? Authorizing Provider  predniSONE (STERAPRED UNI-PAK 21 TAB) 10 MG (21) TBPK tablet Take by mouth daily. Take 6 tabs by mouth daily  for 2 days, then 5 tabs for 2 days, then 4 tabs for 2 days, then 3 tabs for 2 days, 2 tabs for 2 days, then 1 tab by mouth daily for 2 days 02/02/22  Yes Eliezer Lofts, FNP  albuterol (VENTOLIN HFA) 108 (90 Base) MCG/ACT inhaler Inhale 2 puffs by mouth into the lungs  every 6 (six) hours as needed for wheezing. 11/04/21   Samuel Bouche, NP  fexofenadine (ALLEGRA ALLERGY) 180 MG tablet Take 1 tablet (180 mg total) by mouth daily for 15 days. 12/25/20 09/23/21  Eliezer Lofts, FNP  fluticasone (FLONASE) 50 MCG/ACT nasal spray Place 1-2 sprays into both nostrils daily. Patient taking differently: Place 1-2 sprays into both nostrils daily as needed. 12/24/19   Emeterio Reeve, DO  Norethindrone Acetate-Ethinyl Estrad-FE (BLISOVI 24 FE) 1-20 MG-MCG(24) tablet Take 1 tablet by mouth daily. 12/06/21   Samuel Bouche, NP  PARoxetine (PAXIL) 20 MG tablet Take 1 tablet (20 mg total) by mouth daily. 12/06/21   Samuel Bouche, NP  pyridostigmine (MESTINON) 60 MG tablet 1 tablet (60 mg total) in the morning at 8am and 1 tablet (60 mg total) daily at 12 noon AND 1 tablet (60 mg total) every evening at 5pm 09/23/21   Alda Berthold, DO    Family History Family History  Problem Relation Age of Onset   Hypertension Father    Cerebrovascular Disease Maternal Grandmother    Heart disease Paternal Grandmother    Myasthenia gravis Paternal Grandmother  Hypertension Paternal Grandfather     Social History Social History   Tobacco Use   Smoking status: Never   Smokeless tobacco: Never  Vaping Use   Vaping Use: Never used  Substance Use Topics   Alcohol use: Yes    Comment: Social Drinking   Drug use: No     Allergies   Fentanyl, Influenza vaccines, and Vicodin [hydrocodone-acetaminophen]   Review of Systems Review of Systems  Skin:  Positive for rash.  All other systems reviewed and are negative.    Physical Exam Triage Vital Signs ED Triage Vitals  Enc Vitals Group     BP 02/02/22 1740 110/74     Pulse Rate 02/02/22 1740 66     Resp 02/02/22 1740 16     Temp 02/02/22 1740 98.7 F (37.1 C)     Temp Source 02/02/22 1740 Oral     SpO2 02/02/22 1740 99 %     Weight --      Height --      Head Circumference --      Peak Flow --      Pain Score 02/02/22 1739 3      Pain Loc --      Pain Edu? --      Excl. in [place]? --    No data found.  Updated Vital Signs BP 110/74 (BP Location: Left Arm)   Pulse 66   Temp 98.7 F (37.1 C) (Oral)   Resp 16   LMP 01/19/2022   SpO2 99%     Physical Exam Vitals and nursing note reviewed.  Constitutional:      Appearance: Normal appearance. She is normal weight.  HENT:     Head: Normocephalic and atraumatic.     Mouth/Throat:     Mouth: Mucous membranes are moist.     Pharynx: Oropharynx is clear.  Eyes:     Extraocular Movements: Extraocular movements intact.     Conjunctiva/sclera: Conjunctivae normal.     Pupils: Pupils are equal, round, and reactive to light.  Cardiovascular:     Rate and Rhythm: Normal rate and regular rhythm.     Pulses: Normal pulses.     Heart sounds: Normal heart sounds.  Pulmonary:     Effort: Pulmonary effort is normal.     Breath sounds: Normal breath sounds. No wheezing, rhonchi or rales.  Musculoskeletal:     Cervical back: Normal range of motion and neck supple.  Skin:    General: Skin is warm and dry.     Comments: Right upper/lower arm (anterior/volar aspects): Pruritic erythematous maculopapular eruption with clustered, grouped vesicular lesions noted-see image below  Neurological:     General: No focal deficit present.     Mental Status: She is alert and oriented to person, place, and time.       UC Treatments / Results  Labs (all labs ordered are listed, but only abnormal results are displayed) Labs Reviewed - No data to display  EKG   Radiology No results found.  Procedures Procedures (including critical care time)  Medications Ordered in UC Medications  methylPREDNISolone sodium succinate (SOLU-MEDROL) 125 mg/2 mL injection 125 mg (125 mg Intramuscular Given 02/02/22 1758)    Initial Impression / Assessment and Plan / UC Course  I have reviewed the triage vital signs and the nursing notes.  Pertinent labs & imaging results that were  available during my care of the patient were reviewed by me and considered in my medical decision making (see  chart for details).     MDM: 1.  Poison ivy dermatitis-IM Solu-Medrol 125 mg given once in clinic, Rx'd Sterapred Unipak. Advised patient to take medication as directed with food to completion.  Advised patient to start Esther Hardy tomorrow morning Friday, 02/03/2022.  Encouraged patient increase daily water intake while taking this medication.  Advised patient to change bed linens for the next 3 nights to avoid recontamination.  Advised if symptoms worsen and/or unresolved please follow-up with PCP or here for further evaluation. Discharged home, hemodynamically stable.  Final Clinical Impressions(s) / UC Diagnoses   Final diagnoses:  Poison ivy dermatitis     Discharge Instructions      Advised patient to take medication as directed with food to completion.  Advised patient to start Esther Hardy tomorrow morning Friday, 02/03/2022.  Encouraged patient increase daily water intake while taking this medication.  Advised patient to change bed linens for the next 3 nights to avoid recontamination.  Advised if symptoms worsen and/or unresolved please follow-up with PCP or here for further evaluation.     ED Prescriptions     Medication Sig Dispense Auth. Provider   predniSONE (STERAPRED UNI-PAK 21 TAB) 10 MG (21) TBPK tablet Take by mouth daily. Take 6 tabs by mouth daily  for 2 days, then 5 tabs for 2 days, then 4 tabs for 2 days, then 3 tabs for 2 days, 2 tabs for 2 days, then 1 tab by mouth daily for 2 days 42 tablet Eliezer Lofts, FNP      PDMP not reviewed this encounter.   Eliezer Lofts, [place] 02/02/22 Johnnye Lana

## 2022-02-02 NOTE — Discharge Instructions (Addendum)
Advised patient to take medication as directed with food to completion.  Advised patient to start Robin Robertson tomorrow morning Friday, 02/03/2022.  Encouraged patient increase daily water intake while taking this medication.  Advised patient to change bed linens for the next 3 nights to avoid recontamination.  Advised if symptoms worsen and/or unresolved please follow-up with PCP or here for further evaluation.

## 2022-02-03 ENCOUNTER — Telehealth: Payer: Self-pay | Admitting: Emergency Medicine

## 2022-02-03 NOTE — Telephone Encounter (Signed)
[place].  Advised that if doing well to disregard the call, any questions or concerns, feel free to give the office a call back.

## 2022-02-24 ENCOUNTER — Other Ambulatory Visit (HOSPITAL_COMMUNITY): Payer: Self-pay

## 2022-02-26 ENCOUNTER — Other Ambulatory Visit: Payer: Self-pay

## 2022-02-26 ENCOUNTER — Ambulatory Visit
Admission: RE | Admit: 2022-02-26 | Discharge: 2022-02-26 | Disposition: A | Discharge status: Home / Self Care | Payer: 59 | Source: Ambulatory Visit | Attending: Urgent Care | Admitting: Urgent Care

## 2022-02-26 ENCOUNTER — Ambulatory Visit (INDEPENDENT_AMBULATORY_CARE_PROVIDER_SITE_OTHER): Payer: 59

## 2022-02-26 VITALS — BP 112/72 | HR 81 | Temp 98.5°F | Resp 20 | Ht 64.0 in | Wt 140.0 lb

## 2022-02-26 DIAGNOSIS — G7000 Myasthenia gravis without (acute) exacerbation: Secondary | ICD-10-CM | POA: Diagnosis not present

## 2022-02-26 DIAGNOSIS — J22 Unspecified acute lower respiratory infection: Secondary | ICD-10-CM

## 2022-02-26 DIAGNOSIS — R059 Cough, unspecified: Secondary | ICD-10-CM

## 2022-02-26 DIAGNOSIS — R509 Fever, unspecified: Secondary | ICD-10-CM | POA: Diagnosis not present

## 2022-02-26 LAB — POC SARS CORONAVIRUS 2 AG -  ED: SARS Coronavirus 2 Ag: NEGATIVE

## 2022-02-26 MED ORDER — AZITHROMYCIN 250 MG PO TABS: ORAL_TABLET | ORAL | 0 refills | Status: DC

## 2022-02-26 MED ORDER — PREDNISONE 50 MG PO TABS: 50.0000 mg | ORAL_TABLET | Freq: Every day | ORAL | 0 refills | Status: AC

## 2022-02-26 MED ORDER — ALBUTEROL SULFATE (2.5 MG/3ML) 0.083% IN NEBU
2.5000 mg | INHALATION_SOLUTION | Freq: Once | RESPIRATORY_TRACT | Status: AC
  Administered 2022-02-26: 2.5 mg via RESPIRATORY_TRACT

## 2022-02-26 MED ORDER — MONTELUKAST SODIUM 10 MG PO TABS: 10.0000 mg | ORAL_TABLET | Freq: Every day | ORAL | 0 refills | Status: DC

## 2022-02-26 NOTE — ED Provider Notes (Signed)
[name]    CSN: [id number] Arrival date & time: 02/26/22  1149      History   Chief Complaint Chief Complaint  Patient presents with   Cough    Worsening cough - Entered by patient    HPI Robin Robertson is a 36 y.o. female.   Pleasant 36 year old female with a known history of myasthenia gravis presents today due to a 1 week history of worsening cough.  States her children likely contracted croup from daycare.  They had been coughing first. Husband also ill with cough and a fever as of Friday evening. Patient started out with mild URI symptoms, but it progressed to a worsening cough that is persistent throughout the day.  She developed a low-grade fever yesterday.  She admits that when she contracts illnesses, she feels that her myasthenia gravis becomes worse, and becomes more short of breath.  She does have a handheld albuterol inhaler which she uses sporadically, has tried without significant improvement in her current symptoms.  She has been taking over-the-counter cough medications, but tries to avoid them because of various reactions.   Cough   Past Medical History:  Diagnosis Date   Headache    Myasthenia gravis ([place])    Myasthenia gravis ([place])    Post partum depression     Patient Active Problem List   Diagnosis Date Noted   Women's annual routine gynecological examination 09/10/2019   Migraine without aura and without status migrainosus, not intractable 08/05/2018   Depression 08/05/2018   Fatigue 06/14/2018   Postpartum depression 02/18/2018   Rubella non-immune status, antepartum 07/16/2017   Myasthenia gravis ([place]) 11/24/2014    Past Surgical History:  Procedure Laterality Date   WISDOM TOOTH EXTRACTION      OB History     Gravida  2   Para  2   Term  2   Preterm      AB      Living  2      SAB      IAB      Ectopic      Multiple  0   Live Births  1            Home Medications    Prior to Admission  medications   Medication Sig Start Date End Date Taking? Authorizing Provider  azithromycin (ZITHROMAX Z-PAK) 250 MG tablet Take two tabs PO today ('500mg'$ ) followed by one tab PO Q day until gone 02/26/22  Yes Edwinna Rochette L, PA  montelukast (SINGULAIR) 10 MG tablet Take 1 tablet (10 mg total) by mouth at bedtime. 02/26/22  Yes Nijah Tejera L, PA  predniSONE (DELTASONE) 50 MG tablet Take 1 tablet (50 mg total) by mouth daily with breakfast for 5 days. 02/26/22 03/03/22 Yes Jenae Tomasello L, PA  albuterol (VENTOLIN HFA) 108 (90 Base) MCG/ACT inhaler Inhale 2 puffs by mouth into the lungs every 6 (six) hours as needed for wheezing. 11/04/21   Samuel Bouche, NP  fexofenadine (ALLEGRA ALLERGY) 180 MG tablet Take 1 tablet (180 mg total) by mouth daily for 15 days. 12/25/20 09/23/21  Eliezer Lofts, FNP  fluticasone (FLONASE) 50 MCG/ACT nasal spray Place 1-2 sprays into both nostrils daily. Patient taking differently: Place 1-2 sprays into both nostrils daily as needed. 12/24/19   Emeterio Reeve, DO  Norethindrone Acetate-Ethinyl Estrad-FE (BLISOVI 24 FE) 1-20 MG-MCG(24) tablet Take 1 tablet by mouth daily. 12/06/21   Samuel Bouche, NP  PARoxetine (PAXIL) 20 MG tablet Take 1  tablet (20 mg total) by mouth daily. 12/06/21   Samuel Bouche, NP  pyridostigmine (MESTINON) 60 MG tablet 1 tablet (60 mg total) in the morning at 8am and 1 tablet (60 mg total) daily at 12 noon AND 1 tablet (60 mg total) every evening at 5pm 09/23/21   Alda Berthold, DO    Family History Family History  Problem Relation Age of Onset   Hypertension Father    Cerebrovascular Disease Maternal Grandmother    Heart disease Paternal Grandmother    Myasthenia gravis Paternal Grandmother    Hypertension Paternal Grandfather     Social History Social History   Tobacco Use   Smoking status: Never   Smokeless tobacco: Never  Vaping Use   Vaping Use: Never used  Substance Use Topics   Alcohol use: Yes    Comment: Social Drinking   Drug  use: No     Allergies   Fentanyl, Influenza vaccines, and Vicodin [hydrocodone-acetaminophen]   Review of Systems Review of Systems  Respiratory:  Positive for cough.   As per HPI   Physical Exam Triage Vital Signs ED Triage Vitals  Enc Vitals Group     BP 02/26/22 1237 112/72     Pulse Rate 02/26/22 1237 81     Resp 02/26/22 1237 20     Temp 02/26/22 1237 98.5 F (36.9 C)     Temp Source 02/26/22 1237 Oral     SpO2 02/26/22 1237 96 %     Weight 02/26/22 1239 140 lb (63.5 kg)     Height 02/26/22 1239 '5\' 4"'$  (1.626 m)     Head Circumference --      Peak Flow --      Pain Score 02/26/22 1238 5     Pain Loc --      Pain Edu? --      Excl. in [place]? --    No data found.  Updated Vital Signs BP 112/72 (BP Location: Right Arm)   Pulse 81   Temp 98.5 F (36.9 C) (Oral)   Resp 20   Ht '5\' 4"'$  (1.626 m)   Wt 140 lb (63.5 kg)   LMP 01/19/2022   SpO2 96%   BMI 24.03 kg/m   Visual Acuity Right Eye Distance:   Left Eye Distance:   Bilateral Distance:    Right Eye Near:   Left Eye Near:    Bilateral Near:     Physical Exam Vitals and nursing note reviewed.  Constitutional:      General: She is not in acute distress.    Appearance: Normal appearance. She is well-developed and normal weight. She is ill-appearing. She is not toxic-appearing or diaphoretic.  HENT:     Head: Normocephalic and atraumatic.     Right Ear: Tympanic membrane, ear canal and external ear normal. There is no impacted cerumen.     Left Ear: Tympanic membrane, ear canal and external ear normal. There is no impacted cerumen.     Nose: Congestion present. No rhinorrhea.     Mouth/Throat:     Mouth: Mucous membranes are moist.     Pharynx: Oropharynx is clear. No oropharyngeal exudate or posterior oropharyngeal erythema.  Eyes:     General: No scleral icterus.       Right eye: No discharge.        Left eye: No discharge.     Extraocular Movements: Extraocular movements intact.      Conjunctiva/sclera: Conjunctivae normal.     Pupils: Pupils  are equal, round, and reactive to light.  Cardiovascular:     Rate and Rhythm: Normal rate and regular rhythm.     Pulses: Normal pulses.     Heart sounds: No murmur heard. Pulmonary:     Effort: Pulmonary effort is normal. No respiratory distress.     Breath sounds: No stridor. Wheezing (mild anterior, worse on R lower) present. No rhonchi or rales.  Chest:     Chest wall: No tenderness.  Abdominal:     Palpations: Abdomen is soft.     Tenderness: There is no abdominal tenderness.  Musculoskeletal:        General: No swelling.     Cervical back: Normal range of motion and neck supple. No rigidity or tenderness.  Lymphadenopathy:     Cervical: No cervical adenopathy.  Skin:    General: Skin is warm and dry.     Capillary Refill: Capillary refill takes less than 2 seconds.     Coloration: Skin is not jaundiced.     Findings: No bruising, erythema or rash.  Neurological:     Mental Status: She is alert.  Psychiatric:        Mood and Affect: Mood normal.      UC Treatments / Results  Labs (all labs ordered are listed, but only abnormal results are displayed) Labs Reviewed  POC SARS CORONAVIRUS 2 AG -  ED    EKG   Radiology DG Chest 2 View  Result Date: 02/26/2022 CLINICAL DATA:  Cough and fever. EXAM: CHEST - 2 VIEW COMPARISON:  02/04/2021 chest CT and 08/29/2017 chest radiograph FINDINGS: Cardiomediastinal silhouette is unchanged. A RIGHT pericardial fat pad is again noted. There is no evidence of focal airspace disease, pulmonary edema, suspicious pulmonary nodule/mass, pleural effusion, or pneumothorax. No acute bony abnormalities are identified. IMPRESSION: No active cardiopulmonary disease. Electronically Signed   By: Margarette Canada M.D.   On: 02/26/2022 13:42    Procedures Procedures (including critical care time)  Medications Ordered in UC Medications  albuterol (PROVENTIL) (2.5 MG/3ML) 0.083% nebulizer  solution 2.5 mg (2.5 mg Nebulization Given 02/26/22 1358)    Initial Impression / Assessment and Plan / UC Course  I have reviewed the triage vital signs and the nursing notes.  Pertinent labs & imaging results that were available during my care of the patient were reviewed by me and considered in my medical decision making (see chart for details).     Lower respiratory tract infection -chest x-ray obtained, radiology read as negative.  I do have clinical concern given her decreased immune system, myasthenia gravis, and appearance of x-ray compared to previous CT, for possible developing lower respiratory tract infection, primarily to the right lower lobe.  Azithromycin would be a good choice secondary to the anti-inflammatory properties as well.  Patient just recently completed a course of prednisone, and is not wanting to repeat another course so soon due to adverse side effects it causes her.  She was given an albuterol nebulizer in clinic, which she responded well to.  Due to patient's intolerance to prednisone, we will do a trial of montelukast.  Instructed to take at nighttime. Prednisone was called in however should sx progress. COVID test was negative.  Additional supportive measures reviewed, in addition to return to clinic precautions.   Final Clinical Impressions(s) / UC Diagnoses   Final diagnoses:  Lower respiratory tract infection  Myasthenia gravis [facility])     Discharge Instructions      Your covid test is  negative. Your chest xray is normal. I suspect a developing lower respiratory tract infection however, thus treatment with azithromycin would be beneficial.  Please take montelukast nightly. You may continue your OTC cough medications as needed. Steam from shower, cold air from freezer, and vaporizers may be beneficial. You can continue your inhaler every 4-6 hours as needed. Do not repeat albuterol use until at least 6pm this evening. If your symptoms do not respond to  the above treatment alone, you may start oral prednisone in 2-3 days. Take in the morning to prevent insomnia. Follow up with your PCP if symptoms persist, RTC if symptoms worsen.      ED Prescriptions     Medication Sig Dispense Auth. Provider   azithromycin (ZITHROMAX Z-PAK) 250 MG tablet Take two tabs PO today ('500mg'$ ) followed by one tab PO Q day until gone 6 tablet Ansar Skoda L, PA   montelukast (SINGULAIR) 10 MG tablet Take 1 tablet (10 mg total) by mouth at bedtime. 14 tablet Cyndy Braver L, PA   predniSONE (DELTASONE) 50 MG tablet Take 1 tablet (50 mg total) by mouth daily with breakfast for 5 days. 5 tablet Delita Chiquito L, [place]      PDMP not reviewed this encounter.   Chaney Malling, [place] 02/26/22 1701

## 2022-02-26 NOTE — Discharge Instructions (Addendum)
Your covid test is negative. Your chest xray is normal. I suspect a developing lower respiratory tract infection however, thus treatment with azithromycin would be beneficial.  Please take montelukast nightly. You may continue your OTC cough medications as needed. Steam from shower, cold air from freezer, and vaporizers may be beneficial. You can continue your inhaler every 4-6 hours as needed. Do not repeat albuterol use until at least 6pm this evening. If your symptoms do not respond to the above treatment alone, you may start oral prednisone in 2-3 days. Take in the morning to prevent insomnia. Follow up with your PCP if symptoms persist, RTC if symptoms worsen.

## 2022-02-26 NOTE — ED Triage Notes (Signed)
Pt c/o cough, fever, fatigue and headaches onset for a week. Pt reported taking Dayquil this morning.

## 2022-03-06 ENCOUNTER — Other Ambulatory Visit (HOSPITAL_BASED_OUTPATIENT_CLINIC_OR_DEPARTMENT_OTHER): Payer: Self-pay

## 2022-03-07 ENCOUNTER — Other Ambulatory Visit (HOSPITAL_BASED_OUTPATIENT_CLINIC_OR_DEPARTMENT_OTHER): Payer: Self-pay

## 2022-03-17 ENCOUNTER — Other Ambulatory Visit: Payer: Self-pay | Admitting: Medical-Surgical

## 2022-03-17 ENCOUNTER — Other Ambulatory Visit (HOSPITAL_COMMUNITY): Payer: Self-pay

## 2022-03-17 DIAGNOSIS — Z8659 Personal history of other mental and behavioral disorders: Secondary | ICD-10-CM

## 2022-03-20 ENCOUNTER — Encounter: Payer: Self-pay | Admitting: Medical-Surgical

## 2022-03-20 ENCOUNTER — Other Ambulatory Visit (HOSPITAL_COMMUNITY): Payer: Self-pay

## 2022-03-20 DIAGNOSIS — Z8659 Personal history of other mental and behavioral disorders: Secondary | ICD-10-CM

## 2022-03-20 MED ORDER — PAROXETINE HCL 20 MG PO TABS
20.0000 mg | ORAL_TABLET | Freq: Every day | ORAL | 1 refills | Status: DC
  Filled 2022-03-20: qty 90, 90d supply, fill #0

## 2022-03-20 MED ORDER — BLISOVI 24 FE 1-20 MG-MCG(24) PO TABS
1.0000 | ORAL_TABLET | Freq: Every day | ORAL | 2 refills | Status: DC
  Filled 2022-03-20 – 2022-03-29 (×2): qty 84, 84d supply, fill #0

## 2022-03-28 ENCOUNTER — Other Ambulatory Visit (HOSPITAL_COMMUNITY): Payer: Self-pay

## 2022-03-29 ENCOUNTER — Other Ambulatory Visit (HOSPITAL_COMMUNITY): Payer: Self-pay

## 2022-05-03 ENCOUNTER — Other Ambulatory Visit (HOSPITAL_COMMUNITY): Payer: Self-pay

## 2022-05-03 ENCOUNTER — Telehealth: Payer: 59 | Admitting: Physician Assistant

## 2022-05-03 DIAGNOSIS — J019 Acute sinusitis, unspecified: Secondary | ICD-10-CM | POA: Diagnosis not present

## 2022-05-03 DIAGNOSIS — B9689 Other specified bacterial agents as the cause of diseases classified elsewhere: Secondary | ICD-10-CM | POA: Diagnosis not present

## 2022-05-03 MED ORDER — AMOXICILLIN-POT CLAVULANATE 875-125 MG PO TABS
1.0000 | ORAL_TABLET | Freq: Two times a day (BID) | ORAL | 0 refills | Status: DC
  Filled 2022-05-03: qty 14, 7d supply, fill #0

## 2022-05-03 NOTE — Progress Notes (Signed)

## 2022-05-03 NOTE — Progress Notes (Signed)
I have spent 5 minutes in review of e-visit questionnaire, review and updating patient chart, medical decision making and response to patient.   Cloria Ciresi Cody Hayward Rylander, PA-C    

## 2022-05-05 ENCOUNTER — Ambulatory Visit (INDEPENDENT_AMBULATORY_CARE_PROVIDER_SITE_OTHER): Payer: 59

## 2022-05-05 ENCOUNTER — Encounter: Payer: Self-pay | Admitting: Emergency Medicine

## 2022-05-05 ENCOUNTER — Ambulatory Visit
Admission: EM | Admit: 2022-05-05 | Discharge: 2022-05-05 | Disposition: A | Discharge status: Home / Self Care | Payer: 59 | Attending: Family Medicine | Admitting: Family Medicine

## 2022-05-05 DIAGNOSIS — M25471 Effusion, right ankle: Secondary | ICD-10-CM

## 2022-05-05 DIAGNOSIS — W19XXXA Unspecified fall, initial encounter: Secondary | ICD-10-CM

## 2022-05-05 DIAGNOSIS — M25571 Pain in right ankle and joints of right foot: Secondary | ICD-10-CM

## 2022-05-05 DIAGNOSIS — M7989 Other specified soft tissue disorders: Secondary | ICD-10-CM | POA: Diagnosis not present

## 2022-05-05 NOTE — ED Triage Notes (Signed)
Patient states that she fell down her deck steps today and rolled her right ankle.  Throughout the day the ankle has began to swell.  Patient has taken Ibuprofen for pain.

## 2022-05-05 NOTE — Discharge Instructions (Addendum)
Advised patient of right ankle x-ray results with hard copy provided to patient.  Advised patient may RICE affected area of right ankle for 30 minutes 3 times daily for the next 3 days.  Advised patient if symptoms worsen and/or unresolved please follow-up with [facility] health orthopedic provider for further evaluation.  Contact information is below.

## 2022-05-05 NOTE — ED Provider Notes (Signed)
[name]    CSN: [id number] Arrival date & time: 05/05/22  1919      History   Chief Complaint Chief Complaint  Patient presents with   Foot Injury    Entered by patient   Fall    HPI Robin Robertson is a 36 y.o. female.   HPI 36 year old female presents with ankle pain secondary to falling down her deck steps and rolled her right ankle..  Patient is accompanied by her husband this evening.  Patient ambulated from our lobby to exam room 3 with crutches from home today unable to bear weight on right foot.  PMH significant for myasthenia gravis, fatigue and depression.  Past Medical History:  Diagnosis Date   Headache    Myasthenia gravis ([place])    Myasthenia gravis ([place])    Post partum depression     Patient Active Problem List   Diagnosis Date Noted   Women's annual routine gynecological examination 09/10/2019   Migraine without aura and without status migrainosus, not intractable 08/05/2018   Depression 08/05/2018   Fatigue 06/14/2018   Postpartum depression 02/18/2018   Rubella non-immune status, antepartum 07/16/2017   Myasthenia gravis ([place]) 11/24/2014    Past Surgical History:  Procedure Laterality Date   WISDOM TOOTH EXTRACTION      OB History     Gravida  2   Para  2   Term  2   Preterm      AB      Living  2      SAB      IAB      Ectopic      Multiple  0   Live Births  1            Home Medications    Prior to Admission medications   Medication Sig Start Date End Date Taking? Authorizing Provider  albuterol (VENTOLIN HFA) 108 (90 Base) MCG/ACT inhaler Inhale 2 puffs by mouth into the lungs every 6 (six) hours as needed for wheezing. 11/04/21  Yes Samuel Bouche, NP  amoxicillin-clavulanate (AUGMENTIN) 875-125 MG tablet Take 1 tablet by mouth 2 (two) times daily. 05/03/22  Yes Brunetta Jeans, PA-C  Norethindrone Acetate-Ethinyl Estrad-FE (BLISOVI 24 FE) 1-20 MG-MCG(24) tablet Take 1 tablet by mouth daily.  03/20/22  Yes Samuel Bouche, NP  PARoxetine (PAXIL) 20 MG tablet Take 1 tablet (20 mg total) by mouth daily. 03/20/22  Yes Samuel Bouche, NP  pyridostigmine (MESTINON) 60 MG tablet 1 tablet (60 mg total) in the morning at 8am and 1 tablet (60 mg total) daily at 12 noon AND 1 tablet (60 mg total) every evening at 5pm 09/23/21  Yes Patel, Donika K, DO  montelukast (SINGULAIR) 10 MG tablet Take 1 tablet (10 mg total) by mouth at bedtime. 02/26/22   Chaney Malling, PA    Family History Family History  Problem Relation Age of Onset   Hypertension Father    Cerebrovascular Disease Maternal Grandmother    Heart disease Paternal Grandmother    Myasthenia gravis Paternal Grandmother    Hypertension Paternal Grandfather     Social History Social History   Tobacco Use   Smoking status: Never   Smokeless tobacco: Never  Vaping Use   Vaping Use: Never used  Substance Use Topics   Alcohol use: Yes    Comment: Social Drinking   Drug use: No     Allergies   Fentanyl, Influenza vaccines, and Vicodin [hydrocodone-acetaminophen]   Review of Systems Review  of Systems  Musculoskeletal:        Right ankle pain secondary to fall on deck steps earlier today.  All other systems reviewed and are negative.    Physical Exam Triage Vital Signs ED Triage Vitals  Enc Vitals Group     BP 05/05/22 1924 121/74     Pulse Rate 05/05/22 1924 80     Resp 05/05/22 1924 18     Temp 05/05/22 1924 98 F (36.7 C)     Temp Source 05/05/22 1924 Oral     SpO2 05/05/22 1924 97 %     Weight 05/05/22 1926 140 lb (63.5 kg)     Height 05/05/22 1926 '5\' 4"'$  (1.626 m)     Head Circumference --      Peak Flow --      Pain Score 05/05/22 1926 6     Pain Loc --      Pain Edu? --      Excl. in [place]? --    No data found.  Updated Vital Signs BP 121/74 (BP Location: Left Arm)   Pulse 80   Temp 98 F (36.7 C) (Oral)   Resp 18   Ht '5\' 4"'$  (1.626 m)   Wt 140 lb (63.5 kg)   LMP 04/21/2022   SpO2 97%   BMI 24.03  kg/m    Physical Exam Vitals and nursing note reviewed.  Constitutional:      Appearance: Normal appearance. She is normal weight.  HENT:     Head: Normocephalic and atraumatic.     Mouth/Throat:     Mouth: Mucous membranes are moist.     Pharynx: Oropharynx is clear.  Eyes:     Extraocular Movements: Extraocular movements intact.     Conjunctiva/sclera: Conjunctivae normal.     Pupils: Pupils are equal, round, and reactive to light.  Cardiovascular:     Rate and Rhythm: Normal rate and regular rhythm.     Pulses: Normal pulses.     Heart sounds: Normal heart sounds.  Pulmonary:     Effort: Pulmonary effort is normal.     Breath sounds: Normal breath sounds. No wheezing, rhonchi or rales.  Musculoskeletal:     Cervical back: Normal range of motion and neck supple.     Comments: Right ankle: Moderate soft tissue swelling over lateral malleolus, TTP, limited range of motion with dorsi/plantarflexion  Skin:    General: Skin is warm and dry.  Neurological:     General: No focal deficit present.     Mental Status: She is alert and oriented to person, place, and time.      UC Treatments / Results  Labs (all labs ordered are listed, but only abnormal results are displayed) Labs Reviewed - No data to display  EKG   Radiology DG Ankle Complete Right  Result Date: 05/05/2022 CLINICAL DATA:  Fall, right ankle pain/injury EXAM: RIGHT ANKLE - COMPLETE 3+ VIEW COMPARISON:  None Available. FINDINGS: No fracture or dislocation is seen. The ankle mortise is intact. The base of the fifth metatarsal is unremarkable. Mild lateral soft tissue swelling. IMPRESSION: No fracture or dislocation is seen. Mild lateral soft tissue swelling. Electronically Signed   By: Julian Hy M.D.   On: 05/05/2022 19:45    Procedures Procedures (including critical care time)  Medications Ordered in UC Medications - No data to display  Initial Impression / Assessment and Plan / UC Course  I have  reviewed the triage vital signs and the nursing notes.  Pertinent labs & imaging results that were available during my care of the patient were reviewed by me and considered in my medical decision making (see chart for details).     MDM: 1.  Right Ankle pain-Right ankle x-ray results reveal above. Advised patient of right ankle x-ray results with hard copy provided to patient.  Advised patient may RICE affected area of right ankle for 30 minutes 3 times daily for the next 3 days.  Advised patient if symptoms worsen and/or unresolved please follow-up with [facility] health orthopedic provider for further evaluation.  Contact information is below.  Patient discharged home, hemodynamically stable. Final Clinical Impressions(s) / UC Diagnoses   Final diagnoses:  Pain and swelling of right ankle     Discharge Instructions      Advised patient of right ankle x-ray results with hard copy provided to patient.  Advised patient may RICE affected area of right ankle for 30 minutes 3 times daily for the next 3 days.  Advised patient if symptoms worsen and/or unresolved please follow-up with [facility] health orthopedic provider for further evaluation.  Contact information is below.     ED Prescriptions   None    PDMP not reviewed this encounter.   Eliezer Lofts, FNP 05/05/22 2007

## 2022-05-06 ENCOUNTER — Telehealth: Payer: Self-pay

## 2022-05-06 NOTE — Telephone Encounter (Signed)
TC to f/u with pt after yesterday's visit to [facility]. Pt reports she is doing okay and has no problems or questions to discuss at this time.

## 2022-05-11 NOTE — Progress Notes (Signed)
Follow-up Visit   Date: 05/12/22   Robin Robertson MRN: [id number] DOB: 1986-03-30   Interim History: Robin Robertson is a 36 y.o. right-handed Caucasian female with seropositive gravis (2013) returning to the clinic for follow-up of myasthenia gravis.  The patient was accompanied to the clinic by self.   IMPRESSION/PLAN: Seropositive myasthenia gravis without exacerbation, diagnosed 2013, predominately ocular  Exam shows right ptosis (stable).  She also has give-way weakness in the legs, which is not due to myasthenia gravis.    Frequent falls.  I am not sure what is causing her falls.  No evidence of MG exacerbation, myopathy, or neuropathy.  I am not sure what is causing her falls.  I do not see a primary neuromuscular etiology for this.  I have asked her to follow-up with PCP to assess for musculoskeletal cause    PLAN/RECOMMENDATIONS:  Continue mestinon '60mg'$  twice daily.  OK to take an extra dose as needed Start PT for leg strengthening and balance  Return to clinic in 6 months   -----------------------------------------------------------  History of present illness: She was diagnosed with MG by antibody testing in ~ 2013 when living in [place], [place].  These records are not available to review.  Fortunately, her symptoms have predominately been ocular and she has not needed anything more than mestinon '60mg'$  2-3 times daily.  She reports having has generalized fatigue, right eye droopiness, shortness of breath, palpitation, headaches.  She sleeps on one pillow. She tends to get a lot of URI and feels that her weakness is worse when sick.    She takes mestinon '60mg'$  twice daily.  She has never been on prednisone.    She had never had MG crisis, required IVIG, or plasmapheresis.   UPDATE 09/23/2021:  She is here for 6 month visit. Overall, her MG is well-controlled. She complains of arm heaviness where she is unable to blow dry her hair, but does not have difficulty with  other ADLs.  She has noticed that usually at the end of her work-day, she has difficulty focusing with her eyes, denies double vision.  No problems with speech or swallow. She continues to have generalized fatigue and intermittent shortness of breath. CT chest was negative for thymoma.   UPDATE 05/12/2022:  She is here for follow-up visit.  She increased the dose of mestinon '60mg'$  three times daily for about a month, which improved her weakness and fatigue.  She was able to stay on mestinon '60mg'$  twice daily and takes an extra dose 2-3 times per month.  No double vision, difficulty swallowing/talking.  She has fallen 3-4 times in the past month, which has always occurred on steps. No associated leg or back pain.  No lightheadedness or vision changes. She feels that her legs are weak.  She sprained her right ankle and was using crutches until recently.     Medications:  Current Outpatient Medications on File Prior to Visit  Medication Sig Dispense Refill   albuterol (VENTOLIN HFA) 108 (90 Base) MCG/ACT inhaler Inhale 2 puffs by mouth into the lungs every 6 (six) hours as needed for wheezing. 8.5 g 5   Norethindrone Acetate-Ethinyl Estrad-FE (BLISOVI 24 FE) 1-20 MG-MCG(24) tablet Take 1 tablet by mouth daily. 84 tablet 2   PARoxetine (PAXIL) 20 MG tablet Take 1 tablet (20 mg total) by mouth daily. 90 tablet 1   pyridostigmine (MESTINON) 60 MG tablet 1 tablet (60 mg total) in the morning at 8am and 1 tablet (60 mg  total) daily at 12 noon AND 1 tablet (60 mg total) every evening at 5pm (Patient taking differently: 1 tablet (60 mg total) in the morning at 8am and 1 tablet (60 mg total) daily at 12 noon AND 1 tablet (60 mg total) every evening at 5pm  Take two times daily and another tablet as needed.) 360 tablet 3   amoxicillin-clavulanate (AUGMENTIN) 875-125 MG tablet Take 1 tablet by mouth 2 (two) times daily. (Patient not taking: Reported on 05/12/2022) 14 tablet 0   No current facility-administered  medications on file prior to visit.    Allergies:  Allergies  Allergen Reactions   Fentanyl Anaphylaxis    Stopped breathing, heart rate dropped to 8bpm   Influenza Vaccines Shortness Of Breath    Chest pain, Left side of body went numb for 2 weeks   Vicodin [Hydrocodone-Acetaminophen] Rash    Anger and Black outs    Vital Signs:  BP 117/75   Pulse 81   Ht '5\' 4"'$  (1.626 m)   Wt 147 lb (66.7 kg)   LMP 04/21/2022   SpO2 99%   BMI 25.23 kg/m    Neurological Exam: MENTAL STATUS including orientation to time, place, person, recent and remote memory, attention span and concentration, language, and fund of knowledge is normal.  Speech is not dysarthric.  CRANIAL NERVES: Pupils equal round and reactive to light.  Normal conjugate, extra-ocular eye movements in all directions of gaze.  Mild-moderate right ptosis at baseline, no worsening with sustained upgaze.  Face is symmetric and muscles are 5/5. Palate elevates symmetrically.  Tongue is midline.  MOTOR:  Motor strength is 5/5 in all extremities, give-way weakness in the legs.  No atrophy, fasciculations or abnormal movements.  No pronator drift.  Tone is normal.    COORDINATION/GAIT:  Normal finger-to- nose-finger.  She is able to stand up from chair without using arms.   Gait narrow based and stable.   Data: CT chest w contrast 02/06/2021:  No thymoma   Thank you for allowing me to participate in patient's care.  If I can answer any additional questions, I would be pleased to do so.    Sincerely,    Emilyanne Mcgough K. Posey Pronto, DO

## 2022-05-12 ENCOUNTER — Ambulatory Visit (INDEPENDENT_AMBULATORY_CARE_PROVIDER_SITE_OTHER): Payer: 59 | Admitting: Neurology

## 2022-05-12 ENCOUNTER — Encounter: Payer: Self-pay | Admitting: Neurology

## 2022-05-12 VITALS — BP 117/75 | HR 81 | Ht 64.0 in | Wt 147.0 lb

## 2022-05-12 DIAGNOSIS — R296 Repeated falls: Secondary | ICD-10-CM

## 2022-05-12 DIAGNOSIS — R2681 Unsteadiness on feet: Secondary | ICD-10-CM

## 2022-05-12 DIAGNOSIS — G7000 Myasthenia gravis without (acute) exacerbation: Secondary | ICD-10-CM

## 2022-05-14 NOTE — Progress Notes (Unsigned)
HPI: Robin Robertson is a 36 y.o. female who  has a past medical history of Headache, Myasthenia gravis ([place]), Myasthenia gravis ([place]), and Post partum depression.  she presents to [facility] today, 05/15/22,  for chief complaint of: Annual physical exam  Dentist: UTD, q 6 month Eye exam: UTD, glasses and contacts Exercise: limited since recent illnesses Diet: eats all food groups Pap smear: sees OB/GYN COVID vaccine: 2 done, no boosters  Concerns: Still   Past medical, surgical, social and family history reviewed:  Patient Active Problem List   Diagnosis Date Noted   Women's annual routine gynecological examination 09/10/2019   Migraine without aura and without status migrainosus, not intractable 08/05/2018   Depression 08/05/2018   Fatigue 06/14/2018   Postpartum depression 02/18/2018   Rubella non-immune status, antepartum 07/16/2017   Myasthenia gravis ([place]) 11/24/2014    Past Surgical History:  Procedure Laterality Date   WISDOM TOOTH EXTRACTION      Social History   Tobacco Use   Smoking status: Never   Smokeless tobacco: Never  Substance Use Topics   Alcohol use: Yes    Comment: Social Drinking    Family History  Problem Relation Age of Onset   Hypertension Father    Cerebrovascular Disease Maternal Grandmother    Heart disease Paternal Grandmother    Myasthenia gravis Paternal Grandmother    Hypertension Paternal Grandfather      Current medication list and allergy/intolerance information reviewed:    Current Outpatient Medications  Medication Sig Dispense Refill   budesonide (PULMICORT) 180 MCG/ACT inhaler Inhale 1-2 puffs into the lungs 2 (two) times daily. 1 each 6   doxycycline (VIBRA-TABS) 100 MG tablet Take 1 tablet (100 mg total) by mouth 2 (two) times daily for 7 days. 14 tablet 0   albuterol (VENTOLIN HFA) 108 (90 Base) MCG/ACT inhaler Inhale 2 puffs by mouth into the lungs every 6 (six) hours as  needed for wheezing. 8.5 g 5   Norethindrone Acetate-Ethinyl Estrad-FE (BLISOVI 24 FE) 1-20 MG-MCG(24) tablet Take 1 tablet by mouth daily. 84 tablet 2   PARoxetine (PAXIL) 20 MG tablet Take 1 tablet (20 mg total) by mouth daily. 90 tablet 1   pyridostigmine (MESTINON) 60 MG tablet 1 tablet (60 mg total) in the morning at 8am and 1 tablet (60 mg total) daily at 12 noon AND 1 tablet (60 mg total) every evening at 5pm (Patient taking differently: 1 tablet (60 mg total) in the morning at 8am and 1 tablet (60 mg total) daily at 12 noon AND 1 tablet (60 mg total) every evening at 5pm  Take two times daily and another tablet as needed.) 360 tablet 3   No current facility-administered medications for this visit.   Allergies  Allergen Reactions   Fentanyl Anaphylaxis    Stopped breathing, heart rate dropped to 8bpm   Influenza Vaccines Shortness Of Breath    Chest pain, Left side of body went numb for 2 weeks   Vicodin [Hydrocodone-Acetaminophen] Rash    Anger and Black outs   Review of Systems: Constitutional:  No  fever, no chills, + recent illness, No unintentional weight changes. No significant fatigue.  HEENT: No  headache, no vision change, no hearing change, No sore throat, + sinus pressure Cardiac: No  chest pain, No  pressure, No palpitations, No  Orthopnea Respiratory:  No  shortness of breath. + Cough, + chest congestion Gastrointestinal: No  abdominal pain, No  nausea, No  vomiting,  No  blood in stool, No  diarrhea, No  constipation  Musculoskeletal: No new myalgia/arthralgia Skin: No  Rash, No other wounds/concerning lesions Genitourinary: No  incontinence, No  abnormal genital bleeding, No abnormal genital discharge Hem/Onc: No  easy bruising/bleeding, No  abnormal lymph node Endocrine: No cold intolerance,  No heat intolerance. No polyuria/polydipsia/polyphagia  Neurologic: No  weakness, No  dizziness, No  slurred speech/focal weakness/facial droop Psychiatric: No  concerns with  depression, No  concerns with anxiety, No sleep problems, No mood problems  Exam:  BP 119/67 (BP Location: Right Arm, Cuff Size: Normal)   Pulse 73   Resp 20   Ht '5\' 4"'$  (1.626 m)   Wt 147 lb 4.8 oz (66.8 kg)   LMP 04/21/2022   SpO2 97%   BMI 25.28 kg/m  Constitutional: VS see above. General Appearance: alert, well-developed, well-nourished, NAD Eyes: Normal lids and conjunctive, non-icteric sclera Ears, Nose, Mouth, Throat: MMM, Normal external inspection ears/nares/mouth/lips/gums. TM normal bilaterally. Pharynx/tonsils no erythema, no exudate. Nasal mucosa normal.  Neck: No masses, trachea midline. No thyroid enlargement. No tenderness/mass appreciated. No lymphadenopathy Respiratory: Normal respiratory effort. no wheeze, no rhonchi, no rales.  + Strong nonproductive wet cough. Cardiovascular: S1/S2 normal, no murmur, no rub/gallop auscultated. RRR. No lower extremity edema. Pedal pulse II/IV bilaterally DP and PT. No carotid bruit or JVD. No abdominal aortic bruit. Gastrointestinal: Nontender, no masses. No hepatomegaly, no splenomegaly. No hernia appreciated. Bowel sounds normal. Rectal exam deferred.  Musculoskeletal: Gait normal. No clubbing/cyanosis of digits.  Neurological: Normal balance/coordination. No tremor. No cranial nerve deficit on limited exam. Motor and sensation intact and symmetric. Cerebellar reflexes intact.  Skin: warm, dry, intact. No rash/ulcer. No concerning nevi or subq nodules on limited exam.   Psychiatric: Normal judgment/insight. Normal mood and affect. Oriented x3.    ASSESSMENT/PLAN:   1. Annual physical exam Checking labs as below.  Up-to-date on preventative care.  Wellness information provided with AVS. - Lipid panel - COMPLETE METABOLIC PANEL WITH GFR - CBC with Differential/Platelet  2. History of depression Continue Paxil 20 mg daily.  Refill sent. - PARoxetine (PAXIL) 20 MG tablet; Take 1 tablet (20 mg total) by mouth daily.  Dispense: 90  tablet; Refill: 1  3. Acute bacterial bronchitis Adding doxycycline 100 mg twice daily for 7 days.  Adding Pulmicort 1 to 2 puffs twice daily.  Holding off on prednisone as she has had at least 4 rounds of prednisone in the past few months.  Okay to use albuterol inhaler as needed. - budesonide (PULMICORT) 180 MCG/ACT inhaler; Inhale 1-2 puffs into the lungs 2 (two) times daily.  Dispense: 1 each; Refill: 6 - doxycycline (VIBRA-TABS) 100 MG tablet; Take 1 tablet (100 mg total) by mouth 2 (two) times daily for 7 days.  Dispense: 14 tablet; Refill: 0  4. Surveillance for birth control, oral contraceptives Continue Blisovi 1 tablet daily. - Norethindrone Acetate-Ethinyl Estrad-FE (BLISOVI 24 FE) 1-20 MG-MCG(24) tablet; Take 1 tablet by mouth daily.  Dispense: 84 tablet; Refill: 2  Orders Placed This Encounter  Procedures   Lipid panel   COMPLETE METABOLIC PANEL WITH GFR   CBC with Differential/Platelet    Meds ordered this encounter  Medications   Norethindrone Acetate-Ethinyl Estrad-FE (BLISOVI 24 FE) 1-20 MG-MCG(24) tablet    Sig: Take 1 tablet by mouth daily.    Dispense:  84 tablet    Refill:  2   PARoxetine (PAXIL) 20 MG tablet    Sig: Take 1 tablet (20 mg total)  by mouth daily.    Dispense:  90 tablet    Refill:  1   budesonide (PULMICORT) 180 MCG/ACT inhaler    Sig: Inhale 1-2 puffs into the lungs 2 (two) times daily.    Dispense:  1 each    Refill:  6    Order Specific Question:   Supervising Provider    Answer:   MATTHEWS, CODY [[id number]]   doxycycline (VIBRA-TABS) 100 MG tablet    Sig: Take 1 tablet (100 mg total) by mouth 2 (two) times daily for 7 days.    Dispense:  14 tablet    Refill:  0    Order Specific Question:   Supervising Provider    Answer:   MATTHEWS, CODY [[id number]]    There are no Patient Instructions on file for this visit.  Follow-up plan: Return in about 1 year (around 05/16/2023) for annual physical exam or sooner if needed.  Clearnce Sorrel, DNP, APRN,  FNP-BC [facility]

## 2022-05-15 ENCOUNTER — Encounter: Payer: Self-pay | Admitting: Medical-Surgical

## 2022-05-15 ENCOUNTER — Other Ambulatory Visit: Payer: Self-pay | Admitting: Medical-Surgical

## 2022-05-15 ENCOUNTER — Other Ambulatory Visit (HOSPITAL_COMMUNITY): Payer: Self-pay

## 2022-05-15 ENCOUNTER — Ambulatory Visit (INDEPENDENT_AMBULATORY_CARE_PROVIDER_SITE_OTHER): Payer: 59 | Admitting: Medical-Surgical

## 2022-05-15 VITALS — BP 119/67 | HR 73 | Resp 20 | Ht 64.0 in | Wt 147.3 lb

## 2022-05-15 DIAGNOSIS — Z8659 Personal history of other mental and behavioral disorders: Secondary | ICD-10-CM | POA: Diagnosis not present

## 2022-05-15 DIAGNOSIS — B9689 Other specified bacterial agents as the cause of diseases classified elsewhere: Secondary | ICD-10-CM | POA: Diagnosis not present

## 2022-05-15 DIAGNOSIS — Z0000 Encounter for general adult medical examination without abnormal findings: Secondary | ICD-10-CM

## 2022-05-15 DIAGNOSIS — J208 Acute bronchitis due to other specified organisms: Secondary | ICD-10-CM | POA: Diagnosis not present

## 2022-05-15 DIAGNOSIS — Z3041 Encounter for surveillance of contraceptive pills: Secondary | ICD-10-CM

## 2022-05-15 MED ORDER — BUDESONIDE 180 MCG/ACT IN AEPB: 1.0000 | INHALATION_SPRAY | Freq: Two times a day (BID) | RESPIRATORY_TRACT | 6 refills | Status: DC

## 2022-05-15 MED ORDER — DOXYCYCLINE HYCLATE 100 MG PO TABS: 100.0000 mg | ORAL_TABLET | Freq: Two times a day (BID) | ORAL | 0 refills | Status: AC

## 2022-05-15 MED ORDER — PAROXETINE HCL 20 MG PO TABS
20.0000 mg | ORAL_TABLET | Freq: Every day | ORAL | 1 refills | Status: DC
  Filled 2022-05-15 – 2022-05-31 (×2): qty 90, 90d supply, fill #0
  Filled 2022-09-01: qty 90, 90d supply, fill #1

## 2022-05-15 MED ORDER — BLISOVI 24 FE 1-20 MG-MCG(24) PO TABS
1.0000 | ORAL_TABLET | Freq: Every day | ORAL | 2 refills | Status: DC
  Filled 2022-05-15 – 2022-06-19 (×2): qty 84, 84d supply, fill #0
  Filled 2022-09-01: qty 84, 84d supply, fill #1
  Filled 2022-11-27: qty 84, 84d supply, fill #2

## 2022-05-16 ENCOUNTER — Encounter: Payer: Self-pay | Admitting: Medical-Surgical

## 2022-05-16 LAB — LIPID PANEL
Cholesterol: 166 mg/dL (ref <200)
HDL: 63 mg/dL (ref >=50)
LDL Cholesterol (Calc): 83 mg/dL (calc)
Non-HDL Cholesterol (Calc): 103 mg/dL (calc) (ref <130)
Total CHOL/HDL Ratio: 2.6 (calc) (ref <5.0)
Triglycerides: 104 mg/dL (ref <150)

## 2022-05-16 LAB — CBC WITH DIFFERENTIAL/PLATELET
Absolute Monocytes: 658 cells/uL (ref 200–950)
Basophils Absolute: 56 cells/uL (ref 0–200)
Basophils Relative: 0.4 %
Eosinophils Absolute: 168 cells/uL (ref 15–500)
Eosinophils Relative: 1.2 %
HCT: 40.5 % (ref 35.0–45.0)
Hemoglobin: 13.6 g/dL (ref 11.7–15.5)
Lymphs Abs: 2380 cells/uL (ref 850–3900)
MCH: 29.6 pg (ref 27.0–33.0)
MCHC: 33.6 g/dL (ref 32.0–36.0)
MCV: 88 fL (ref 80.0–100.0)
MPV: 9.8 fL (ref 7.5–12.5)
Monocytes Relative: 4.7 %
Neutro Abs: 10738 cells/uL — ABNORMAL HIGH (ref 1500–7800)
Neutrophils Relative %: 76.7 %
Platelets: 340 10*3/uL (ref 140–400)
RBC: 4.6 10*6/uL (ref 3.80–5.10)
RDW: 12.1 % (ref 11.0–15.0)
Total Lymphocyte: 17 %
WBC: 14 10*3/uL — ABNORMAL HIGH (ref 3.8–10.8)

## 2022-05-16 LAB — COMPLETE METABOLIC PANEL WITH GFR
AG Ratio: 1.7 (calc) (ref 1.0–2.5)
ALT: 11 U/L (ref 6–29)
AST: 12 U/L (ref 10–30)
Albumin: 4.5 g/dL (ref 3.6–5.1)
Alkaline phosphatase (APISO): 50 U/L (ref 31–125)
BUN: 19 mg/dL (ref 7–25)
CO2: 23 mmol/L (ref 20–32)
Calcium: 9.1 mg/dL (ref 8.6–10.2)
Chloride: 102 mmol/L (ref 98–110)
Creat: 0.69 mg/dL (ref 0.50–0.97)
Globulin: 2.7 g/dL (calc) (ref 1.9–3.7)
Glucose, Bld: 64 mg/dL — ABNORMAL LOW (ref 65–99)
Potassium: 3.9 mmol/L (ref 3.5–5.3)
Sodium: 138 mmol/L (ref 135–146)
Total Bilirubin: 0.4 mg/dL (ref 0.2–1.2)
Total Protein: 7.2 g/dL (ref 6.1–8.1)
eGFR: 115 mL/min/{1.73_m2} (ref >=60)

## 2022-05-16 MED ORDER — FLUTICASONE PROPIONATE HFA 110 MCG/ACT IN AERO: 2.0000 | INHALATION_SPRAY | Freq: Two times a day (BID) | RESPIRATORY_TRACT | 0 refills | Status: AC

## 2022-05-31 ENCOUNTER — Other Ambulatory Visit (HOSPITAL_COMMUNITY): Payer: Self-pay

## 2022-05-31 ENCOUNTER — Telehealth: Payer: 59 | Admitting: Family Medicine

## 2022-05-31 DIAGNOSIS — L237 Allergic contact dermatitis due to plants, except food: Secondary | ICD-10-CM

## 2022-05-31 MED ORDER — PREDNISONE 10 MG PO TABS
ORAL_TABLET | ORAL | 0 refills | Status: AC
  Filled 2022-05-31: qty 37, 14d supply, fill #0

## 2022-05-31 NOTE — Progress Notes (Signed)
E-Visit for Poison Ivy  We are sorry that you are not feeing well.  Here is how we plan to help!  Based on what you have shared with me it looks like you have had an allergic reaction to the oily resin from a group of plants.  This resin is very sticky, so it easily attaches to your skin, clothing, tools equipment, and pet's fur.    This blistering rash is often called poison ivy rash although it can come from contact with the leaves, stems and roots of poison ivy, poison oak and poison sumac.  The oily resin contains urushiol (u-ROO-she-ol) that produces a skin rash on exposed skin.  The severity of the rash depends on the amount of urushiol that gets on your skin.  A section of skin with more urushiol on it may develop a rash sooner.  The rash usually develops 12-48 hours after exposure and can last two to three weeks.  Your skin must come in direct contact with the plant's oil to be affected.  Blister fluid doesn't spread the rash.  However, if you come into contact with a piece of clothing or pet fur that has urushiol on it, the rash may spread out.  You can also transfer the oil to other parts of your body with your fingers.  Often the rash looks like a straight line because of the way the plant brushes against your skin.  Since your rash is widespread or has resulted in a large number of blisters, I have prescribed an oral corticosteroid.  Please follow these recommendations:  I have sent a prednisone dose pack to your chosen pharmacy. Be sure to follow the instructions carefully and complete the entire prescription. You may use Benadryl or Caladryl topical lotions to sooth the itch and remember cool, not hot, showers and baths can help relieve the itching!  Place cool, wet compresses on the affected area for 15-30 minutes several times a day.  You may also take oral antihistamines, such as diphenhydramine (Benadryl, others), which may also help you sleep better.  Watch your skin for any purulent  (pus) drainage or red streaking from the site.  If this occurs, contact your provider.  You may require an antibiotic for a skin infection.  Make sure that the clothes you were wearing as well as any towels or sheets that may have come in contact with the oil (urushiol) are washed in detergent and hot water.       I have developed the following plan to treat your condition I am prescribing a two week course of steroids (37 tablets of 10 mg prednisone).  Days 1-4 take 4 tablets (40 mg) daily  Days 5-8 take 3 tablets (30 mg) daily, Days 9-11 take 2 tablets (20 mg) daily, Days 12-14 take 1 tablet (10 mg) daily.    What can you do to prevent this rash?  Avoid the plants.  Learn how to identify poison ivy, poison oak and poison sumac in all seasons.  When hiking or engaging in other activities that might expose you to these plants, try to stay on cleared pathways.  If camping, make sure you pitch your tent in an area free of these plants.  Keep pets from running through wooded areas so that urushiol doesn't accidentally stick to their fur, which you may touch.  Remove or kill the plants.  In your yard, you can get rid of poison ivy by applying an herbicide or pulling it out of   the ground, including the roots, while wearing heavy gloves.  Afterward remove the gloves and thoroughly wash them and your hands.  Don't burn poison ivy or related plants because the urushiol can be carried by smoke.  Wear protective clothing.  If needed, protect your skin by wearing socks, boots, pants, long sleeves and vinyl gloves.  Wash your skin right away.  Washing off the oil with soap and water within 30 minutes of exposure may reduce your chances of getting a poison ivy rash.  Even washing after an hour or so can help reduce the severity of the rash.  If you walk through some poison ivy and then later touch your shoes, you may get some urushiol on your hands, which may then transfer to your face or body by touching or  rubbing.  If the contaminated object isn't cleaned, the urushiol on it can still cause a skin reaction years later.    Be careful not to reuse towels after you have washed your skin.  Also carefully wash clothing in detergent and hot water to remove all traces of the oil.  Handle contaminated clothing carefully so you don't transfer the urushiol to yourself, furniture, rugs or appliances.  Remember that pets can carry the oil on their fur and paws.  If you think your pet may be contaminated with urushiol, put on some long rubber gloves and give your pet a bath.  Finally, be careful not to burn these plants as the smoke can contain traces of the oil.  Inhaling the smoke may result in difficulty breathing. If that occurred you should see a physician as soon as possible.  See your doctor right away if:  The reaction is severe or widespread You inhaled the smoke from burning poison ivy and are having difficulty breathing Your skin continues to swell The rash affects your eyes, mouth or genitals Blisters are oozing pus You develop a fever greater than 100 F (37.8 C) The rash doesn't get better within a few weeks.  If you scratch the poison ivy rash, bacteria under your fingernails may cause the skin to become infected.  See your doctor if pus starts oozing from the blisters.  Treatment generally includes antibiotics.  Poison ivy treatments are usually limited to self-care methods.  And the rash typically goes away on its own in two to three weeks.     If the rash is widespread or results in a large number of blisters, your doctor may prescribe an oral corticosteroid, such as prednisone.  If a bacterial infection has developed at the rash site, your doctor may give you a prescription for an oral antibiotic.  MAKE SURE YOU  Understand these instructions. Will watch your condition. Will get help right away if you are not doing well or get worse.   Thank you for choosing an e-visit.  Your  e-visit answers were reviewed by a board certified advanced clinical practitioner to complete your personal care plan. Depending upon the condition, your plan could have included both over the counter or prescription medications.  Please review your pharmacy choice. Make sure the pharmacy is open so you can pick up prescription now. If there is a problem, you may contact your provider through MyChart messaging and have the prescription routed to another pharmacy.  Your safety is important to us. If you have drug allergies check your prescription carefully.   For the next 24 hours you can use MyChart to ask questions about today's visit, request a non-urgent   call back, or ask for a work or school excuse. You will get an email in the next two days asking about your experience. I hope that your e-visit has been valuable and will speed your recovery.   I provided 5 minutes of non face-to-face time during this encounter for chart review, medication and order placement, as well as and documentation.

## 2022-06-02 ENCOUNTER — Encounter: Payer: Self-pay | Admitting: Physical Therapy

## 2022-06-02 ENCOUNTER — Ambulatory Visit: Payer: 59 | Attending: Neurology | Admitting: Physical Therapy

## 2022-06-02 ENCOUNTER — Other Ambulatory Visit: Payer: Self-pay

## 2022-06-02 DIAGNOSIS — M6281 Muscle weakness (generalized): Secondary | ICD-10-CM | POA: Diagnosis not present

## 2022-06-02 DIAGNOSIS — R2681 Unsteadiness on feet: Secondary | ICD-10-CM | POA: Insufficient documentation

## 2022-06-02 DIAGNOSIS — R296 Repeated falls: Secondary | ICD-10-CM | POA: Insufficient documentation

## 2022-06-02 DIAGNOSIS — G7000 Myasthenia gravis without (acute) exacerbation: Secondary | ICD-10-CM | POA: Diagnosis not present

## 2022-06-02 NOTE — Therapy (Signed)
OUTPATIENT PHYSICAL THERAPY NEURO EVALUATION   Patient Name: CHARM STENNER MRN: [id number] DOB:[birth year], 36 y.o., female Today's Date: 06/02/2022   PCP: Samuel Bouche, NP REFERRING PROVIDER: Narda Amber, DO  END OF SESSION:  PT End of Session - 06/02/22 1023     Visit Number 1    Number of Visits 8    Date for PT Re-Evaluation 07/28/22    Authorization Type [employer]    PT Start Time 1020    PT Stop Time 1100    PT Time Calculation (min) 40 min    Activity Tolerance Patient tolerated treatment well    Behavior During Therapy [facility] for tasks assessed/performed             Past Medical History:  Diagnosis Date   Headache    Myasthenia gravis ([place])    Myasthenia gravis ([place])    Post partum depression    Past Surgical History:  Procedure Laterality Date   WISDOM TOOTH EXTRACTION     Patient Active Problem List   Diagnosis Date Noted   Women's annual routine gynecological examination 09/10/2019   Migraine without aura and without status migrainosus, not intractable 08/05/2018   Depression 08/05/2018   Fatigue 06/14/2018   Postpartum depression 02/18/2018   Rubella non-immune status, antepartum 07/16/2017   Myasthenia gravis ([place]) 11/24/2014    ONSET DATE: Worsening balance within the last couple of months  REFERRING DIAG:  G70.00 (ICD-10-CM) - Myasthenia gravis without (acute) exacerbation (HCC)  R26.81 (ICD-10-CM) - Unsteady gait  R29.6 (ICD-10-CM) - Falls frequently    THERAPY DIAG:  Muscle weakness (generalized)  Falls frequently  Unsteady gait  Myasthenia gravis without (acute) exacerbation ([place])  Rationale for Evaluation and Treatment: Rehabilitation  SUBJECTIVE:                                                                                                                                                                                             SUBJECTIVE STATEMENT: Pt reports history of myasthenia gravis (mostly eye is  affected and fatigue). Pt notes some continued pain in right foot after twisting foot on steps. Pt reports continued tightness on lateral R foot. Has noticed pain wrapping around to medial foot. Pt reports recent falls. Pt notes falls have mostly occurred on steps (most recent at the end of October 2023).  Pt accompanied by:  young daughter  PERTINENT HISTORY: History of R knee MCL tear, R LE fracture 10-15 years ago, R foot fractures  PAIN:  Are you having pain? Yes: NPRS scale: 0 currently, 3 or 4 at worst/10 Pain location: R lateral ankle mostly; bunion side  can be sharp pain Pain description: Aching Aggravating factors: Walking, stairs, dorsiflexion Relieving factors: Ice  PRECAUTIONS: Fall  WEIGHT BEARING RESTRICTIONS: No  FALLS: Has patient fallen in last 6 months? Yes. Number of falls 3-4  LIVING ENVIRONMENT: Lives with: lives with their family and lives with their spouse Lives in: House/apartment Stairs: Yes: Internal: 1 flight steps; bilateral but cannot reach both and External: 1 flight steps; bilateral but cannot reach both Has following equipment at home: None  PLOF: Independent; works full time at [employer] on feet for most of the day  PATIENT GOALS: Decrease falls  OBJECTIVE:   DIAGNOSTIC FINDINGS: n/a  COGNITION: Overall cognitive status: Within functional limits for tasks assessed   SENSATION: WFL  COORDINATION: WFL  MUSCLE TONE:none  MUSCLE LENGTH: Hamstrings: Right >90 deg; Left >90 deg  POSTURE: No Significant postural limitations  LOWER EXTREMITY ROM:     Active  Right Eval Left Eval  Hip flexion    Hip extension    Hip abduction    Hip adduction    Hip internal rotation    Hip external rotation    Knee flexion    Knee extension    Ankle dorsiflexion 17 25  Ankle plantarflexion [facility] WFL  Ankle inversion    Ankle eversion     (Blank rows = not tested)  LOWER EXTREMITY MMT:    MMT Right Eval Left Eval  Hip flexion 4 4+   Hip extension 3+ 4+  Hip abduction 3+ 3+  Hip adduction    Hip internal rotation    Hip external rotation    Knee flexion 4+ 5  Knee extension 5 5  Ankle dorsiflexion 5 5  Ankle plantarflexion 11 heel raises until fatigue 20 heel raises  Ankle inversion 3+ 5  Ankle eversion 3+ 5  (Blank rows = not tested)  BED MOBILITY:  Sit to supine Complete Independence Supine to sit Complete Independence Rolling to Right Complete Independence Rolling to Left Complete Independence   STAIRS: Level of Assistance: Modified independence Stair Negotiation Technique: Alternating Pattern  with Bilateral Rails Number of Stairs: 10  Height of Stairs: 4 to 6" steps  Comments: Trunk lean to right during descent with minor LOB  GAIT: Gait pattern: WFL  FUNCTIONAL TESTS:  Berg Balance Scale: 53/56 Functional gait assessment: 24/30  [facility] PT Assessment - 06/02/22 0001       Standardized Balance Assessment   Standardized Balance Assessment Berg Balance Test      Berg Balance Test   Sit to Stand Able to stand without using hands and stabilize independently    Standing Unsupported Able to stand safely 2 minutes    Sitting with Back Unsupported but Feet Supported on Floor or Stool Able to sit safely and securely 2 minutes    Stand to Sit Sits safely with minimal use of hands    Transfers Able to transfer safely, minor use of hands    Standing Unsupported with Eyes Closed Able to stand 10 seconds safely    Standing Unsupported with Feet Together Able to place feet together independently and stand 1 minute safely    From Standing, Reach Forward with Outstretched Arm Can reach confidently >25 cm (10")    From Standing Position, Pick up Object from Floor Able to pick up shoe safely and easily    From Standing Position, Turn to Look Behind Over each Shoulder Looks behind from both sides and weight shifts well    Turn 360 Degrees Able to turn  360 degrees safely in 4 seconds or less    Standing  Unsupported, Alternately Place Feet on Step/Stool Able to complete >2 steps/needs minimal assist    Standing Unsupported, One Foot in Front Able to place foot tandem independently and hold 30 seconds   R foot back 1 minor LOB at 25 sec   Standing on One Leg Able to lift leg independently and hold > 10 seconds    Total Score 53      High Level Balance   High Level Balance Comments --      Functional Gait  Assessment   Gait assessed  Yes    Gait Level Surface Walks 20 ft in less than 5.5 sec, no assistive devices, good speed, no evidence for imbalance, normal gait pattern, deviates no more than 6 in outside of the 12 in walkway width.    Change in Gait Speed Able to smoothly change walking speed without loss of balance or gait deviation. Deviate no more than 6 in outside of the 12 in walkway width.    Gait with Horizontal Head Turns Performs head turns smoothly with no change in gait. Deviates no more than 6 in outside 12 in walkway width    Gait with Vertical Head Turns Performs task with slight change in gait velocity (eg, minor disruption to smooth gait path), deviates 6 - 10 in outside 12 in walkway width or uses assistive device    Gait and Pivot Turn Pivot turns safely within 3 sec and stops quickly with no loss of balance.    Step Over Obstacle Is able to step over 2 stacked shoe boxes taped together (9 in total height) without changing gait speed. No evidence of imbalance.    Gait with Narrow Base of Support Ambulates 7-9 steps.   able to do 10 steps but staggered on 10th   Gait with Eyes Closed Walks 20 ft, uses assistive device, slower speed, mild gait deviations, deviates 6-10 in outside 12 in walkway width. Ambulates 20 ft in less than 9 sec but greater than 7 sec.    Ambulating Backwards Walks 20 ft, slow speed, abnormal gait pattern, evidence for imbalance, deviates 10-15 in outside 12 in walkway width.    Steps Alternating feet, must use rail.    Total Score 24    FGA comment: 24/30               PATIENT SURVEYS:  Did not assess  TODAY'S TREATMENT:                                                                                                                              DATE: 06/02/22  See HEP    PATIENT EDUCATION: Education details: Exam findings, POC, HEP Person educated: Patient Education method: Explanation, Demonstration, and Handouts Education comprehension: verbalized understanding and returned demonstration  HOME EXERCISE PROGRAM: Access Code: [email or web address] URL: [email or web address] Date: 06/02/2022 Prepared by: Estill Bamberg April Thurnell Garbe  Exercises -  Toe Yoga - Alternating Great Toe and Lesser Toe Extension  - 1 x daily - 7 x weekly - 1 sets - 10 reps - Toe Spreading  - 1 x daily - 7 x weekly - 1 sets - 10 reps - CLX Ankle Dorsiflexion and Eversion  - 1 x daily - 7 x weekly - 2 sets - 10 reps - Seated Figure 4 Ankle Inversion with Resistance  - 1 x daily - 7 x weekly - 2 sets - 10 reps - Seated Heel Raise  - 1 x daily - 7 x weekly - 2 sets - 10 reps - Seated Toe Raise  - 1 x daily - 7 x weekly - 2 sets - 10 reps  GOALS: Goals reviewed with patient? Yes  SHORT TERM GOALS: Target date: 06/30/2022   Pt will be ind with initial HEP Baseline: Goal status: INITIAL  2.  Pt will report reduction in worst ankle pain with activities to </=2/10 Baseline:  Goal status: INITIAL    LONG TERM GOALS: Target date: 07/28/2022   Pt will be ind with progression and advancement of HEP  Baseline:  Goal status: INITIAL  2.  Pt will be able to squat at least 25# to demo improved functional LE strength Baseline:  Goal status: INITIAL  3.  Pt will demo at least 28/30 on FGA to demo MCID Baseline:  Goal status: INITIAL  4.  Pt will report no ankle pain with all activities Baseline:  Goal status: INITIAL   ASSESSMENT:  CLINICAL IMPRESSION: Patient is a 36 y.o. F who was seen today for physical therapy evaluation and treatment for  increasing falls. PMH significant for myasthenia gravis. Last fall in October resulted in R ankle sprain. Assessment significant for decreased R ankle strength and instability with some associated pain. Pt demos generalized bilat LE weakness (especially pt's hips). Balance assessment yielded good Berg Balance Score of 53/56; however, FGA score of 24/30 places her at a moderate fall risk. Pt would benefit from PT to address these issues for safer home and community mobility.   OBJECTIVE IMPAIRMENTS: decreased balance, decreased endurance, decreased mobility, difficulty walking, decreased ROM, decreased strength, improper body mechanics, and pain.   ACTIVITY LIMITATIONS: squatting, stairs, locomotion level, and caring for others  PARTICIPATION LIMITATIONS: community activity and occupation  PERSONAL FACTORS: Fitness, Past/current experiences, Time since onset of injury/illness/exacerbation, and 1 comorbidity: myasthenia gravis  are also affecting patient's functional outcome.   REHAB POTENTIAL: Good  CLINICAL DECISION MAKING: Evolving/moderate complexity  EVALUATION COMPLEXITY: Moderate  PLAN:  PT FREQUENCY: 1x/week  PT DURATION: 8 weeks  PLANNED INTERVENTIONS: Therapeutic exercises, Therapeutic activity, Neuromuscular re-education, Balance training, Gait training, Patient/Family education, Self Care, Joint mobilization, Stair training, Aquatic Therapy, Dry Needling, Electrical stimulation, Cryotherapy, Moist heat, Taping, Ionotophoresis '4mg'$ /ml Dexamethasone, Manual therapy, and Re-evaluation  PLAN FOR NEXT SESSION: Assess response to HEP. Progress foot/ankle strengthening as able. Initiate hip strengthening.    Kassandra Meriweather April Gordy Levan, PT, DPT 06/02/2022, 12:05 PM

## 2022-06-05 ENCOUNTER — Ambulatory Visit: Payer: 59 | Admitting: Physical Therapy

## 2022-06-14 ENCOUNTER — Encounter: Payer: Self-pay | Admitting: Physical Therapy

## 2022-06-14 ENCOUNTER — Ambulatory Visit: Payer: 59 | Admitting: Physical Therapy

## 2022-06-14 DIAGNOSIS — R2681 Unsteadiness on feet: Secondary | ICD-10-CM | POA: Diagnosis not present

## 2022-06-14 DIAGNOSIS — R296 Repeated falls: Secondary | ICD-10-CM

## 2022-06-14 DIAGNOSIS — M6281 Muscle weakness (generalized): Secondary | ICD-10-CM

## 2022-06-14 DIAGNOSIS — G7000 Myasthenia gravis without (acute) exacerbation: Secondary | ICD-10-CM

## 2022-06-14 NOTE — Therapy (Addendum)
OUTPATIENT PHYSICAL THERAPY TREATMENT AND DISCHARGE   Patient Name: Robin Robertson MRN: [id number] DOB:1985-07-20, 36 y.o., female Today's Date: 06/14/2022  PHYSICAL THERAPY DISCHARGE SUMMARY  Visits from Start of Care: 2  Current functional level related to goals / functional outcomes: See below   Remaining deficits: See below   Education / Equipment: See below   Patient agrees to discharge. Patient goals were not met. Patient is being discharged due to not returning since the last visit.   PCP: Samuel Bouche, NP REFERRING PROVIDER: Narda Amber, DO  END OF SESSION:  PT End of Session - 06/14/22 1015     Visit Number 2    Number of Visits 8    Date for PT Re-Evaluation 07/28/22    Authorization Type [employer] Employee    PT Start Time 1016    PT Stop Time 1100    PT Time Calculation (min) 44 min    Activity Tolerance Patient tolerated treatment well    Behavior During Therapy [facility] for tasks assessed/performed             Past Medical History:  Diagnosis Date   Headache    Myasthenia gravis ([place])    Myasthenia gravis ([place])    Post partum depression    Past Surgical History:  Procedure Laterality Date   WISDOM TOOTH EXTRACTION     Patient Active Problem List   Diagnosis Date Noted   Women's annual routine gynecological examination 09/10/2019   Migraine without aura and without status migrainosus, not intractable 08/05/2018   Depression 08/05/2018   Fatigue 06/14/2018   Postpartum depression 02/18/2018   Rubella non-immune status, antepartum 07/16/2017   Myasthenia gravis ([place]) 11/24/2014    ONSET DATE: Worsening balance within the last couple of months  REFERRING DIAG:  G70.00 (ICD-10-CM) - Myasthenia gravis without (acute) exacerbation (HCC)  R26.81 (ICD-10-CM) - Unsteady gait  R29.6 (ICD-10-CM) - Falls frequently    THERAPY DIAG:  Muscle weakness (generalized)  Falls frequently  Unsteady gait  Myasthenia gravis without (acute)  exacerbation (HCC)  Rationale for Evaluation and Treatment: Rehabilitation  SUBJECTIVE:                                                                                                                                                                                             SUBJECTIVE STATEMENT: Pt states that the toe exercises have been challenging but the other ankle exercises have been going well.   PERTINENT HISTORY: History of R knee MCL tear, R LE fracture 10-15 years ago, R foot fractures From eval: Pt reports history of myasthenia gravis (mostly eye is affected and  fatigue). Pt notes some continued pain in right foot after twisting foot on steps. Pt reports continued tightness on lateral R foot. Has noticed pain wrapping around to medial foot. Pt reports recent falls. Pt notes falls have mostly occurred on steps (most recent at the end of October 2023).   PAIN:  Are you having pain? Yes: NPRS scale: 0 currently/10 Pain location: R lateral ankle mostly; bunion side can be sharp pain Pain description: Aching Aggravating factors: Walking, stairs, dorsiflexion Relieving factors: Ice  PRECAUTIONS: Fall  WEIGHT BEARING RESTRICTIONS: No  FALLS: Has patient fallen in last 6 months? Yes. Number of falls 3-4  LIVING ENVIRONMENT: Lives with: lives with their family and lives with their spouse Lives in: House/apartment Stairs: Yes: Internal: 1 flight steps; bilateral but cannot reach both and External: 1 flight steps; bilateral but cannot reach both Has following equipment at home: None  PLOF: Independent; works full time at [employer] on feet for most of the day  PATIENT GOALS: Decrease falls  OBJECTIVE:   LOWER EXTREMITY ROM:     Active  Right Eval Left Eval  Ankle dorsiflexion 17 25  Ankle plantarflexion WFL WFL   (Blank rows = not tested)  LOWER EXTREMITY MMT:    MMT Right Eval Left Eval  Hip flexion 4 4+  Hip extension 3+ 4+  Hip abduction 3+ 3+  Hip  adduction    Hip internal rotation    Hip external rotation    Knee flexion 4+ 5  Knee extension 5 5  Ankle dorsiflexion 5 5  Ankle plantarflexion 11 heel raises until fatigue 20 heel raises  Ankle inversion 3+ 5  Ankle eversion 3+ 5  (Blank rows = not tested)  STAIRS: Level of Assistance: Modified independence Stair Negotiation Technique: Alternating Pattern  with Bilateral Rails Number of Stairs: 10  Height of Stairs: 4 to 6" steps  Comments: Trunk lean to right during descent with minor LOB  GAIT: Gait pattern: WFL  FUNCTIONAL TESTS:  Berg Balance Scale: 53/56 Functional gait assessment: 24/30   PATIENT SURVEYS:  Did not assess  TODAY'S TREATMENT:                                                                                                                              [facility] Adult PT Treatment:                                                DATE: 06/14/22 Therapeutic Exercise: Nustep L5 x 5 min Standing Slant board gastroc stretch x 30 sec Slant board soleus stretch x 30 sec Heel/toe raise 2x10 Hip 3 way red TB 2x10 each Runner's step up 2x10 with bilat UE support initially and then none Sitting Ankle 3 way green TB 2x10 each Leg press DL 2x10 115#, SL 2x10 90# Figure  4 stretch x 30 sec Piriformis stretch x 30 sec   DATE: 06/02/22  See HEP    PATIENT EDUCATION: Education details: Exam findings, POC, HEP Person educated: Patient Education method: Explanation, Demonstration, and Handouts Education comprehension: verbalized understanding and returned demonstration  HOME EXERCISE PROGRAM: Access Code: [email or web address] URL: [email or web address] Date: 06/02/2022 Prepared by: Estill Bamberg April Thurnell Garbe  Exercises - Toe Yoga - Alternating Great Toe and Lesser Toe Extension  - 1 x daily - 7 x weekly - 1 sets - 10 reps - Toe Spreading  - 1 x daily - 7 x weekly - 1 sets - 10 reps - CLX Ankle Dorsiflexion and Eversion  - 1 x daily - 7 x weekly - 2 sets -  10 reps - Seated Figure 4 Ankle Inversion with Resistance  - 1 x daily - 7 x weekly - 2 sets - 10 reps - Seated Heel Raise  - 1 x daily - 7 x weekly - 2 sets - 10 reps - Seated Toe Raise  - 1 x daily - 7 x weekly - 2 sets - 10 reps  GOALS: Goals reviewed with patient? Yes  SHORT TERM GOALS: Target date: 06/30/2022   Pt will be ind with initial HEP Baseline: Goal status: INITIAL  2.  Pt will report reduction in worst ankle pain with activities to </=2/10 Baseline:  Goal status: MET    LONG TERM GOALS: Target date: 07/28/2022   Pt will be ind with progression and advancement of HEP  Baseline:  Goal status: INITIAL  2.  Pt will be able to squat at least 25# to demo improved functional LE strength Baseline:  Goal status: INITIAL  3.  Pt will demo at least 28/30 on FGA to demo MCID Baseline:  Goal status: INITIAL  4.  Pt will report no ankle pain with all activities Baseline:  Goal status: INITIAL   ASSESSMENT:  CLINICAL IMPRESSION: Reviewed HEP and progressed ankle strengthening to green TB. Initiated hip strengthening this session for improved hip strategy with balance. Pt is demonstrating good gains in ankle strength and decreased pain   Patient is a 36 y.o. F who was seen today for physical therapy evaluation and treatment for increasing falls. PMH significant for myasthenia gravis. Last fall in October resulted in R ankle sprain. Assessment significant for decreased R ankle strength and instability with some associated pain. Pt demos generalized bilat LE weakness (especially pt's hips). Balance assessment yielded good Berg Balance Score of 53/56; however, FGA score of 24/30 places her at a moderate fall risk. Pt would benefit from PT to address these issues for safer home and community mobility.   OBJECTIVE IMPAIRMENTS: decreased balance, decreased endurance, decreased mobility, difficulty walking, decreased ROM, decreased strength, improper body mechanics, and pain.    ACTIVITY LIMITATIONS: squatting, stairs, locomotion level, and caring for others  PARTICIPATION LIMITATIONS: community activity and occupation  PERSONAL FACTORS: Fitness, Past/current experiences, Time since onset of injury/illness/exacerbation, and 1 comorbidity: myasthenia gravis  are also affecting patient's functional outcome.   REHAB POTENTIAL: Good  CLINICAL DECISION MAKING: Evolving/moderate complexity  EVALUATION COMPLEXITY: Moderate  PLAN:  PT FREQUENCY: 1x/week  PT DURATION: 8 weeks  PLANNED INTERVENTIONS: Therapeutic exercises, Therapeutic activity, Neuromuscular re-education, Balance training, Gait training, Patient/Family education, Self Care, Joint mobilization, Stair training, Aquatic Therapy, Dry Needling, Electrical stimulation, Cryotherapy, Moist heat, Taping, Ionotophoresis 81m/ml Dexamethasone, Manual therapy, and Re-evaluation  PLAN FOR NEXT SESSION: Assess response to HEP. Progress foot/ankle strengthening as able. Initiate hip strengthening.  Kamauri Denardo April Ma L Blase Beckner, PT, DPT 06/14/2022, 10:16 AM

## 2022-06-19 ENCOUNTER — Other Ambulatory Visit: Payer: Self-pay

## 2022-06-23 ENCOUNTER — Ambulatory Visit: Payer: 59 | Admitting: Physical Therapy

## 2022-07-05 ENCOUNTER — Ambulatory Visit: Payer: Self-pay | Attending: Neurology

## 2022-07-05 ENCOUNTER — Telehealth: Payer: Self-pay

## 2022-07-05 DIAGNOSIS — G7000 Myasthenia gravis without (acute) exacerbation: Secondary | ICD-10-CM | POA: Insufficient documentation

## 2022-07-05 DIAGNOSIS — R2681 Unsteadiness on feet: Secondary | ICD-10-CM | POA: Insufficient documentation

## 2022-07-05 DIAGNOSIS — R296 Repeated falls: Secondary | ICD-10-CM | POA: Insufficient documentation

## 2022-07-05 DIAGNOSIS — M6281 Muscle weakness (generalized): Secondary | ICD-10-CM | POA: Insufficient documentation

## 2022-07-05 NOTE — Telephone Encounter (Signed)
Called patient on home phone and left message on voicemail about missed appointment and calling office to schedule more appointments.  Helane Gunther, PTA 07/05/2022

## 2022-09-01 ENCOUNTER — Other Ambulatory Visit (HOSPITAL_COMMUNITY): Payer: Self-pay

## 2022-09-08 ENCOUNTER — Ambulatory Visit: Payer: Self-pay | Admitting: Medical-Surgical

## 2022-09-18 ENCOUNTER — Ambulatory Visit (INDEPENDENT_AMBULATORY_CARE_PROVIDER_SITE_OTHER): Payer: 59 | Admitting: Medical-Surgical

## 2022-09-18 ENCOUNTER — Encounter: Payer: Self-pay | Admitting: Medical-Surgical

## 2022-09-18 VITALS — BP 101/63 | HR 78 | Resp 20 | Ht 64.0 in | Wt 152.2 lb

## 2022-09-18 DIAGNOSIS — R5383 Other fatigue: Secondary | ICD-10-CM

## 2022-09-18 DIAGNOSIS — R4189 Other symptoms and signs involving cognitive functions and awareness: Secondary | ICD-10-CM | POA: Diagnosis not present

## 2022-09-18 MED ORDER — MODAFINIL 200 MG PO TABS: 200.0000 mg | ORAL_TABLET | Freq: Every day | ORAL | 2 refills | Status: DC

## 2022-09-18 NOTE — Progress Notes (Signed)
   Established Patient Office Visit  Subjective   Patient ID: Robin Robertson, female   DOB: 07-Dec-1985 Age: 38 y.o. MRN: [id number]   Chief Complaint  Patient presents with   Fatigue   Shortness of Breath   BRAIN FOG   HPI Pleasant 37 year old female presenting today with complaints of fatigue, and brain fog. She has chronic issues with fatigue but it has been much worse since she had COVID around the beginning of the year. After she recovered from the viral symptoms, she noted that the fatigue did not improve. Notes that she has difficulty staying awake at times. Has trouble completing regular tasks due to exhaustion. She has also been having difficulty with concentration and focus. Has not found anything that helps. On Mestinon to treat myasthenia gravis, stable on current regimen. Does note some shortness of breath but this is with increased activity rather than at rest. Thinks it is related to deconditioning with recent reduction in activity level.    Objective:    Vitals:   09/18/22 1334  BP: 101/63  Pulse: 78  Resp: 20  Height: 5\' 4"  (1.626 m)  Weight: 152 lb 3.2 oz (69 kg)  SpO2: 98%  BMI (Calculated): 26.11    Physical Exam Vitals reviewed.  Constitutional:      General: She is not in acute distress.    Appearance: Normal appearance. She is well-developed. She is not ill-appearing.  HENT:     Head: Normocephalic and atraumatic.  Cardiovascular:     Rate and Rhythm: Normal rate and regular rhythm.     Pulses: Normal pulses.     Heart sounds: Normal heart sounds.  Pulmonary:     Effort: Pulmonary effort is normal. No respiratory distress.     Breath sounds: Normal breath sounds. No decreased breath sounds, wheezing, rhonchi or rales.  Skin:    General: Skin is warm and dry.  Neurological:     Mental Status: She is alert and oriented to person, place, and time.  Psychiatric:        Mood and Affect: Mood normal.        Behavior: Behavior normal.        Thought  Content: Thought content normal.        Judgment: Judgment normal.   No results found for this or any previous visit (from the past 24 hour(s)).     The ASCVD Risk score (Arnett DK, et al., 2019) failed to calculate for the following reasons:   The 2019 ASCVD risk score is only valid for ages 46 to 91   Assessment & Plan:   1. Fatigue, unspecified type 2. Brain fog Unclear etiology. Possible long COVID symptoms vs MG exacerbation. Checking labs as below to evaluate for other causes. Trialing Provigil 200mg  daily in the morning.  - CBC with Differential/Platelet - COMPLETE METABOLIC PANEL WITH GFR - Hemoglobin A1c - Iron, TIBC and Ferritin Panel - T4, free - TSH - Vitamin B12 - VITAMIN D 25 Hydroxy (Vit-D Deficiency, Fractures)  Return if symptoms worsen or fail to improve.  ___________________________________________ Clearnce Sorrel, DNP, APRN, FNP-BC [facility] [place]

## 2022-09-19 LAB — CBC WITH DIFFERENTIAL/PLATELET
Basophils Absolute: 0.1 10*3/uL (ref 0.0–0.2)
Basos: 1 %
EOS (ABSOLUTE): 0.5 10*3/uL — ABNORMAL HIGH (ref 0.0–0.4)
Eos: 7 %
Hematocrit: 40.8 % (ref 34.0–46.6)
Hemoglobin: 13.8 g/dL (ref 11.1–15.9)
Immature Grans (Abs): 0 10*3/uL (ref 0.0–0.1)
Immature Granulocytes: 0 %
Lymphocytes Absolute: 2.4 10*3/uL (ref 0.7–3.1)
Lymphs: 35 %
MCH: 29.9 pg (ref 26.6–33.0)
MCHC: 33.8 g/dL (ref 31.5–35.7)
MCV: 88 fL (ref 79–97)
Monocytes Absolute: 0.3 10*3/uL (ref 0.1–0.9)
Monocytes: 5 %
Neutrophils Absolute: 3.6 10*3/uL (ref 1.4–7.0)
Neutrophils: 52 %
Platelets: 288 10*3/uL (ref 150–450)
RBC: 4.62 x10E6/uL (ref 3.77–5.28)
RDW: 12.5 % (ref 11.7–15.4)
WBC: 6.9 10*3/uL (ref 3.4–10.8)

## 2022-09-19 LAB — COMPREHENSIVE METABOLIC PANEL
ALT: 16 IU/L (ref 0–32)
AST: 13 IU/L (ref 0–40)
Albumin/Globulin Ratio: 2 (ref 1.2–2.2)
Albumin: 4.5 g/dL (ref 3.9–4.9)
Alkaline Phosphatase: 56 IU/L (ref 44–121)
BUN/Creatinine Ratio: 15 (ref 9–23)
BUN: 11 mg/dL (ref 6–20)
Bilirubin Total: 0.4 mg/dL (ref 0.0–1.2)
CO2: 21 mmol/L (ref 20–29)
Calcium: 9.2 mg/dL (ref 8.7–10.2)
Chloride: 104 mmol/L (ref 96–106)
Creatinine, Ser: 0.71 mg/dL (ref 0.57–1.00)
Globulin, Total: 2.3 g/dL (ref 1.5–4.5)
Glucose: 118 mg/dL — ABNORMAL HIGH (ref 70–99)
Potassium: 4 mmol/L (ref 3.5–5.2)
Sodium: 141 mmol/L (ref 134–144)
Total Protein: 6.8 g/dL (ref 6.0–8.5)
eGFR: 113 mL/min/{1.73_m2} (ref >59)

## 2022-09-19 LAB — IRON,TIBC AND FERRITIN PANEL
Ferritin: 54 ng/mL (ref 15–150)
Iron Saturation: 33 % (ref 15–55)
Iron: 143 ug/dL (ref 27–159)
Total Iron Binding Capacity: 436 ug/dL (ref 250–450)
UIBC: 293 ug/dL (ref 131–425)

## 2022-09-19 LAB — HEMOGLOBIN A1C
Est. average glucose Bld gHb Est-mCnc: 108 mg/dL
Hgb A1c MFr Bld: 5.4 % (ref 4.8–5.6)

## 2022-09-19 LAB — TSH: TSH: 0.707 u[IU]/mL (ref 0.450–4.500)

## 2022-09-19 LAB — VITAMIN B12: Vitamin B-12: 340 pg/mL (ref 232–1245)

## 2022-09-19 LAB — SPECIMEN STATUS REPORT

## 2022-09-19 LAB — VITAMIN D 25 HYDROXY (VIT D DEFICIENCY, FRACTURES): Vit D, 25-Hydroxy: 35.5 ng/mL (ref 30.0–100.0)

## 2022-09-19 LAB — T4, FREE: Free T4: 0.84 ng/dL (ref 0.82–1.77)

## 2022-10-16 ENCOUNTER — Encounter: Payer: Self-pay | Admitting: Medical-Surgical

## 2022-10-16 DIAGNOSIS — J3089 Other allergic rhinitis: Secondary | ICD-10-CM

## 2022-10-16 DIAGNOSIS — Z0182 Encounter for allergy testing: Secondary | ICD-10-CM

## 2022-10-17 MED ORDER — MODAFINIL 200 MG PO TABS: 300.0000 mg | ORAL_TABLET | Freq: Every day | ORAL | 1 refills | Status: DC

## 2022-10-17 NOTE — Addendum Note (Signed)
Addended byChristen Butter on: 10/17/2022 07:47 AM   Modules accepted: Orders

## 2022-11-01 ENCOUNTER — Telehealth: Payer: 59 | Admitting: Physician Assistant

## 2022-11-01 DIAGNOSIS — L237 Allergic contact dermatitis due to plants, except food: Secondary | ICD-10-CM

## 2022-11-01 MED ORDER — PREDNISONE 10 MG PO TABS: ORAL_TABLET | ORAL | 0 refills | Status: AC

## 2022-11-01 MED ORDER — TRIAMCINOLONE ACETONIDE 0.1 % EX CREA: 1.0000 | TOPICAL_CREAM | Freq: Two times a day (BID) | CUTANEOUS | 0 refills | Status: DC

## 2022-11-01 NOTE — Progress Notes (Signed)
I have spent 5 minutes in review of e-visit questionnaire, review and updating patient chart, medical decision making and response to patient.   Shaelee Forni Cody Jerilyn Gillaspie, PA-C    

## 2022-11-01 NOTE — Addendum Note (Signed)
Addended by: Waldon Merl on: 11/01/2022 09:51 AM   Modules accepted: Orders

## 2022-11-01 NOTE — Progress Notes (Signed)

## 2022-11-20 ENCOUNTER — Encounter: Payer: Self-pay | Admitting: Neurology

## 2022-11-20 ENCOUNTER — Ambulatory Visit (INDEPENDENT_AMBULATORY_CARE_PROVIDER_SITE_OTHER): Payer: 59 | Admitting: Neurology

## 2022-11-20 VITALS — BP 119/71 | HR 78 | Ht 64.0 in | Wt 147.0 lb

## 2022-11-20 DIAGNOSIS — G7001 Myasthenia gravis with (acute) exacerbation: Secondary | ICD-10-CM | POA: Diagnosis not present

## 2022-11-20 MED ORDER — PYRIDOSTIGMINE BROMIDE 60 MG PO TABS: ORAL_TABLET | ORAL | 3 refills | Status: DC

## 2022-11-20 MED ORDER — ARMODAFINIL 150 MG PO TABS: 150.0000 mg | ORAL_TABLET | Freq: Every day | ORAL | 0 refills | Status: DC

## 2022-11-20 NOTE — Patient Instructions (Signed)
Return to clinic 6 months

## 2022-11-20 NOTE — Progress Notes (Signed)
Follow-up Visit   Date: 11/20/22   Robin Robertson MRN: [id number] DOB: May 19, 1986   Interim History: Robin Robertson is a 37 y.o. right-handed Caucasian female with seropositive gravis (2013) returning to the clinic for follow-up of myasthenia gravis.  The patient was accompanied to the clinic by self.   IMPRESSION/PLAN: Seropositive myasthenia gravis with mild exacerbation, diagnosed 2013, predominately ocular.  Exam shows right ptosis (stable).   Symptoms have been manageable on mestinon alone.  She has not been on prednisone or has severe MG exacerbation.  Right eye twitches are not related to MG and most likely eye muscle strain.     PLAN/RECOMMENDATIONS:  Continue mestinon 60mg  twice daily  Return to clinic in 6 months  -----------------------------------------------------------  History of present illness: She was diagnosed with MG by antibody testing in ~ 2013 when living in [place], [place].  These records are not available to review.  Fortunately, her symptoms have predominately been ocular and she has not needed anything more than mestinon 60mg  2-3 times daily.  She reports having has generalized fatigue, right eye droopiness, shortness of breath, palpitation, headaches.  She sleeps on one pillow. She tends to get a lot of URI and feels that her weakness is worse when sick.    She takes mestinon 60mg  twice daily.  She has never been on prednisone.    She had never had MG crisis, required IVIG, or plasmapheresis.   UPDATE 09/23/2021:  She is here for 6 month visit. Overall, her MG is well-controlled. She complains of arm heaviness where she is unable to blow dry her hair, but does not have difficulty with other ADLs.  She has noticed that usually at the end of her work-day, she has difficulty focusing with her eyes, denies double vision.  No problems with speech or swallow. She continues to have generalized fatigue and intermittent shortness of breath. CT chest was  negative for thymoma.   UPDATE 05/12/2022:  She is here for follow-up visit.  She increased the dose of mestinon 60mg  three times daily for about a month, which improved her weakness and fatigue.  She was able to stay on mestinon 60mg  twice daily and takes an extra dose 2-3 times per month.  No double vision, difficulty swallowing/talking.  She has fallen 3-4 times in the past month, which has always occurred on steps. No associated leg or back pain.  No lightheadedness or vision changes. She feels that her legs are weak.  She sprained her right ankle and was using crutches until recently.    UPDATE 11/20/2022:  She reports having some increased twitches of the right eye. 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  She had COVID in late December/early January and had some cognitive issues related to that.  PCP gave her modafinil which has helped her fatigue. Since doing this, she is taking mestinon only in the morning.  She was previously taking mestinon 60mg  twice daily. No new MG complaints.   Medications:  Current Outpatient Medications on File Prior to Visit  Medication Sig Dispense Refill   albuterol (VENTOLIN HFA) 108 (90 Base) MCG/ACT inhaler Inhale 2 puffs by mouth into the lungs every 6 (six) hours as needed for wheezing. 8.5 g 5   fluticasone (FLOVENT HFA) 110 MCG/ACT inhaler Inhale 2 puffs into the lungs 2 (two) times daily. For cough 1 each 0   modafinil (PROVIGIL) 200 MG tablet Take 1.5 tablets (300 mg total) by mouth daily. (  Patient taking differently: Take 300 mg by mouth daily. Take 200 mg daily) 45 tablet 1   Norethindrone Acetate-Ethinyl Estrad-FE (BLISOVI 24 FE) 1-20 MG-MCG(24) tablet Take 1 tablet by mouth daily. 84 tablet 2   PARoxetine (PAXIL) 20 MG tablet Take 1 tablet (20 mg total) by mouth daily. 90 tablet 1   pyridostigmine (MESTINON) 60 MG tablet 1 tablet (60 mg total) in the morning at 8am and 1 tablet (60 mg total) daily at 12 noon AND 1 tablet  (60 mg total) every evening at 5pm (Patient taking differently: 1 tablet (60 mg total) in the morning at 8am and 1 tablet (60 mg total) daily at 12 noon AND 1 tablet (60 mg total) every evening at 5pm  Take two times daily and another tablet as needed.  11/20/22: Take one tablet in the morning and one in the afternoon as needed.) 360 tablet 3   triamcinolone cream (KENALOG) 0.1 % Apply 1 Application topically 2 (two) times daily. 30 g 0   No current facility-administered medications on file prior to visit.    Allergies:  Allergies  Allergen Reactions   Fentanyl Anaphylaxis    Stopped breathing, heart rate dropped to 8bpm   Influenza Vaccines Shortness Of Breath    Chest pain, Left side of body went numb for 2 weeks   Vicodin [Hydrocodone-Acetaminophen] Rash    Anger and Black outs    Vital Signs:  BP 119/71   Pulse 78   Ht 5\' 4"  (1.626 m)   Wt 147 lb (66.7 kg)   SpO2 98%   BMI 25.23 kg/m    Neurological Exam: MENTAL STATUS including orientation to time, place, person, recent and remote memory, attention span and concentration, language, and fund of knowledge is normal.  Speech is not dysarthric.  CRANIAL NERVES: Pupils equal round and reactive to light.  Normal conjugate, extra-ocular eye movements in all directions of gaze.  Moderate right ptosis at baseline, no worsening with sustained upgaze.  Face is symmetric and muscles are 5/5. Palate elevates symmetrically.  Tongue is midline.  MOTOR:  Motor strength is 5/5 in all extremities, mild give-way weakness in the legs.  No atrophy, fasciculations or abnormal movements.  No pronator drift.  Tone is normal.    COORDINATION/GAIT:  Normal finger-to- nose-finger.  She is able to stand up from chair without using arms.   Gait narrow based and stable.   Data: CT chest w contrast 02/06/2021:  No thymoma   Thank you for allowing me to participate in patient's care.  If I can answer any additional questions, I would be pleased to do  so.    Sincerely,    Rontavious Albright K. Allena Katz, DO

## 2022-11-20 NOTE — Addendum Note (Signed)
Addended byChristen Butter on: 11/20/2022 12:55 PM   Modules accepted: Orders

## 2022-11-27 ENCOUNTER — Other Ambulatory Visit: Payer: Self-pay | Admitting: Medical-Surgical

## 2022-11-27 DIAGNOSIS — Z8659 Personal history of other mental and behavioral disorders: Secondary | ICD-10-CM

## 2022-11-28 ENCOUNTER — Other Ambulatory Visit: Payer: Self-pay

## 2022-11-28 ENCOUNTER — Other Ambulatory Visit (HOSPITAL_COMMUNITY): Payer: Self-pay

## 2022-11-28 MED ORDER — PAROXETINE HCL 20 MG PO TABS
20.0000 mg | ORAL_TABLET | Freq: Every day | ORAL | 1 refills | Status: DC
Start: 2022-11-28 — End: 2023-05-26
  Filled 2022-11-28: qty 90, 90d supply, fill #0
  Filled 2023-02-26: qty 90, 90d supply, fill #1

## 2022-12-18 ENCOUNTER — Telehealth: Payer: 59 | Admitting: Physician Assistant

## 2022-12-18 ENCOUNTER — Other Ambulatory Visit: Payer: Self-pay | Admitting: Medical-Surgical

## 2022-12-18 ENCOUNTER — Other Ambulatory Visit (HOSPITAL_COMMUNITY): Payer: Self-pay

## 2022-12-18 DIAGNOSIS — B9689 Other specified bacterial agents as the cause of diseases classified elsewhere: Secondary | ICD-10-CM | POA: Diagnosis not present

## 2022-12-18 DIAGNOSIS — J019 Acute sinusitis, unspecified: Secondary | ICD-10-CM | POA: Diagnosis not present

## 2022-12-18 MED ORDER — ARMODAFINIL 150 MG PO TABS
150.0000 mg | ORAL_TABLET | Freq: Every day | ORAL | 0 refills | Status: DC
  Filled 2022-12-18: qty 30, 30d supply, fill #0

## 2022-12-18 MED ORDER — AMOXICILLIN-POT CLAVULANATE 875-125 MG PO TABS
1.0000 | ORAL_TABLET | Freq: Two times a day (BID) | ORAL | 0 refills | Status: DC
Start: 2022-12-18 — End: 2023-01-10
  Filled 2022-12-18: qty 14, 7d supply, fill #0

## 2022-12-18 NOTE — Progress Notes (Signed)
I have spent 5 minutes in review of e-visit questionnaire, review and updating patient chart, medical decision making and response to patient.   Mykale Gandolfo Cody Ysidro Ramsay, PA-C    

## 2022-12-18 NOTE — Progress Notes (Signed)

## 2022-12-19 ENCOUNTER — Other Ambulatory Visit: Payer: Self-pay

## 2023-01-05 ENCOUNTER — Telehealth: Payer: 59 | Admitting: Nurse Practitioner

## 2023-01-05 DIAGNOSIS — R3989 Other symptoms and signs involving the genitourinary system: Secondary | ICD-10-CM

## 2023-01-05 MED ORDER — NITROFURANTOIN MONOHYD MACRO 100 MG PO CAPS
100.0000 mg | ORAL_CAPSULE | Freq: Two times a day (BID) | ORAL | 0 refills | Status: AC
Start: 2023-01-05 — End: 2023-01-10

## 2023-01-05 NOTE — Progress Notes (Signed)

## 2023-01-10 ENCOUNTER — Other Ambulatory Visit: Payer: Self-pay

## 2023-01-10 ENCOUNTER — Ambulatory Visit: Admit: 2023-01-10 | Payer: 59

## 2023-01-10 ENCOUNTER — Other Ambulatory Visit: Payer: Self-pay | Admitting: Medical-Surgical

## 2023-01-10 ENCOUNTER — Ambulatory Visit
Admission: EM | Admit: 2023-01-10 | Discharge: 2023-01-10 | Disposition: A | Discharge status: Home / Self Care | Payer: 59 | Attending: Family Medicine | Admitting: Family Medicine

## 2023-01-10 ENCOUNTER — Telehealth: Payer: 59 | Admitting: Family Medicine

## 2023-01-10 VITALS — BP 110/74 | HR 78 | Temp 98.0°F | Resp 16

## 2023-01-10 DIAGNOSIS — J069 Acute upper respiratory infection, unspecified: Secondary | ICD-10-CM | POA: Diagnosis not present

## 2023-01-10 DIAGNOSIS — J302 Other seasonal allergic rhinitis: Secondary | ICD-10-CM

## 2023-01-10 DIAGNOSIS — J329 Chronic sinusitis, unspecified: Secondary | ICD-10-CM

## 2023-01-10 MED ORDER — PREDNISONE 50 MG PO TABS: ORAL_TABLET | ORAL | 0 refills | Status: DC

## 2023-01-10 MED ORDER — AZITHROMYCIN 250 MG PO TABS: ORAL_TABLET | ORAL | 0 refills | Status: DC

## 2023-01-10 MED ORDER — ARMODAFINIL 250 MG PO TABS: 250.0000 mg | ORAL_TABLET | Freq: Every day | ORAL | 0 refills | Status: DC

## 2023-01-10 NOTE — Addendum Note (Signed)
Addended byChristen Butter on: 01/10/2023 02:55 PM   Modules accepted: Orders

## 2023-01-10 NOTE — Progress Notes (Signed)
Because you were treated with a first line treatment and have a reoccurrence within the month you will need to be seen in person to have evaluation, I feel your condition warrants further evaluation and I recommend that you be seen in a face to face visit.   NOTE: There will be NO CHARGE for this eVisit   If you are having a true medical emergency please call 911.

## 2023-01-10 NOTE — Discharge Instructions (Signed)
Continue taking your allergy pill once a day Add prednisone 50 mg a day for 5 days Start back on the Flonase If you still do not see improvement after couple days, fill and take the Z-Pak

## 2023-01-10 NOTE — ED Provider Notes (Signed)
[facility]    CSN: [id number] Arrival date & time: 01/10/23  1617      History   Chief Complaint Chief Complaint  Patient presents with   Nasal Congestion    Entered by patient    HPI Robin Robertson is a 37 y.o. female.   HPI  Past Medical History:  Diagnosis Date   Headache    Myasthenia gravis (HCC)    Myasthenia gravis (HCC)    Post partum depression     Patient Active Problem List   Diagnosis Date Noted   Brain fog 09/18/2022   Women's annual routine gynecological examination 09/10/2019   Migraine without aura and without status migrainosus, not intractable 08/05/2018   Depression 08/05/2018   Fatigue 06/14/2018   Postpartum depression 02/18/2018   Rubella non-immune status, antepartum 07/16/2017   Myasthenia gravis (HCC) 11/24/2014    Past Surgical History:  Procedure Laterality Date   WISDOM TOOTH EXTRACTION      OB History     Gravida  2   Para  2   Term  2   Preterm      AB      Living  2      SAB      IAB      Ectopic      Multiple  0   Live Births  1            Home Medications    Prior to Admission medications   Medication Sig Start Date End Date Taking? Authorizing Provider  Armodafinil 250 MG tablet Take 1 tablet (250 mg total) by mouth daily. 01/10/23   Christen Butter, NP  azithromycin (ZITHROMAX Z-PAK) 250 MG tablet Take two pills today followed by one a day until gone 01/10/23  Yes Eustace Moore, MD  predniSONE (DELTASONE) 50 MG tablet Take once a day for 5 days.  Take with food 01/10/23  Yes Eustace Moore, MD  albuterol (VENTOLIN HFA) 108 (90 Base) MCG/ACT inhaler Inhale 2 puffs by mouth into the lungs every 6 (six) hours as needed for wheezing. 11/04/21   Christen Butter, NP  fluticasone (FLOVENT HFA) 110 MCG/ACT inhaler Inhale 2 puffs into the lungs 2 (two) times daily. For cough 05/16/22   Christen Butter, NP  nitrofurantoin, macrocrystal-monohydrate, (MACROBID) 100 MG capsule Take 1 capsule (100 mg  total) by mouth 2 (two) times daily for 5 days. 01/05/23 01/10/23  Viviano Simas, FNP  Norethindrone Acetate-Ethinyl Estrad-FE (BLISOVI 24 FE) 1-20 MG-MCG(24) tablet Take 1 tablet by mouth daily. 05/15/22   Christen Butter, NP  PARoxetine (PAXIL) 20 MG tablet Take 1 tablet (20 mg total) by mouth daily. 11/28/22   Christen Butter, NP  pyridostigmine (MESTINON) 60 MG tablet Take 1 tablet twice daily (8am and 2pm) 11/20/22   Glendale Chard, DO    Family History Family History  Problem Relation Age of Onset   Hypertension Father    Cerebrovascular Disease Maternal Grandmother    Heart disease Paternal Grandmother    Myasthenia gravis Paternal Grandmother    Hypertension Paternal Grandfather     Social History Social History   Tobacco Use   Smoking status: Never   Smokeless tobacco: Never  Vaping Use   Vaping Use: Never used  Substance Use Topics   Alcohol use: Yes    Comment: Social Drinking   Drug use: No     Allergies   Fentanyl, Influenza vaccines, and Vicodin [hydrocodone-acetaminophen]   Review of Systems Review of  Systems   Physical Exam Triage Vital Signs ED Triage Vitals  Enc Vitals Group     BP 01/10/23 1638 110/74     Pulse Rate 01/10/23 1638 78     Resp 01/10/23 1638 16     Temp 01/10/23 1638 98 F (36.7 C)     Temp Source 01/10/23 1638 Oral     SpO2 01/10/23 1638 98 %     Weight --      Height --      Head Circumference --      Peak Flow --      Pain Score 01/10/23 1640 5     Pain Loc --      Pain Edu? --      Excl. in GC? --    No data found.  Updated Vital Signs BP 110/74 (BP Location: Right Arm)   Pulse 78   Temp 98 F (36.7 C) (Oral)   Resp 16   LMP 01/01/2023 (Approximate)   SpO2 98%     Physical Exam See other note  UC Treatments / Results  Labs (all labs ordered are listed, but only abnormal results are displayed) Labs Reviewed - No data to display  EKG   Radiology No results found.  Procedures Procedures (including critical  care time)  Medications Ordered in UC Medications - No data to display  Initial Impression / Assessment and Plan / UC Course  I have reviewed the triage vital signs and the nursing notes.  Pertinent labs & imaging results that were available during my care of the patient were reviewed by me and considered in my medical decision making (see chart for details).    Final Clinical Impressions(s) / UC Diagnoses   Final diagnoses:  Seasonal allergies  Viral upper respiratory tract infection     Discharge Instructions      Continue taking your allergy pill once a day Add prednisone 50 mg a day for 5 days Start back on the Flonase If you still do not see improvement after couple days, fill and take the Z-Pak    ED Prescriptions     Medication Sig Dispense Auth. Provider   predniSONE (DELTASONE) 50 MG tablet Take once a day for 5 days.  Take with food 5 tablet Eustace Moore, MD   azithromycin (ZITHROMAX Z-PAK) 250 MG tablet Take two pills today followed by one a day until gone 6 tablet Delton See Letta Pate, MD      PDMP not reviewed this encounter.   Eustace Moore, MD 01/10/23 [phone]

## 2023-01-10 NOTE — ED Triage Notes (Signed)
Pt states she was treated June 17th for sinus infection with abx, symptoms improved but states symptoms returned this week, sinus pressure, and congestion. Had negative covid test at home Monday. She is also finishing macrobid tomorrow for UTI.

## 2023-01-10 NOTE — ED Provider Notes (Signed)
[facility]    CSN: [id number] Arrival date & time:         History   Chief Complaint Chief Complaint  Patient presents with   Nasal Congestion    Entered by patient    HPI Robin Robertson is a 37 y.o. female.   HPI  Patient states that she has underlying allergies.  She states that she frequently gets sinus infections.  She had a virtual visit 12/18/2022 and was diagnosed with bacterial sinusitis.  She was treated with Augmentin.  At that time she had 4 days of symptoms with sinus pressure teeth pain green nasal discharge postnasal drip. After that she developed a urinary tract infection and is on Macrobid Now she is having symptoms again.  She states that she did get somewhat better on the Augmentin, but now her symptoms are back.  She states for the last 3 to 4 days she has had sinus pressure, tooth pain, purulent drainage. She has 2 small children at home She states that she is on an unknown allergy pill she takes She has Flonase at home that she does not take regularly  Past Medical History:  Diagnosis Date   Headache    Myasthenia gravis (HCC)    Myasthenia gravis (HCC)    Post partum depression     Patient Active Problem List   Diagnosis Date Noted   Brain fog 09/18/2022   Women's annual routine gynecological examination 09/10/2019   Migraine without aura and without status migrainosus, not intractable 08/05/2018   Depression 08/05/2018   Fatigue 06/14/2018   Postpartum depression 02/18/2018   Rubella non-immune status, antepartum 07/16/2017   Myasthenia gravis (HCC) 11/24/2014    Past Surgical History:  Procedure Laterality Date   WISDOM TOOTH EXTRACTION      OB History     Gravida  2   Para  2   Term  2   Preterm      AB      Living  2      SAB      IAB      Ectopic      Multiple  0   Live Births  1            Home Medications    Prior to Admission medications   Medication Sig Start Date End Date Taking?  Authorizing Provider  Armodafinil 250 MG tablet Take 1 tablet (250 mg total) by mouth daily. 01/10/23   Christen Butter, NP  albuterol (VENTOLIN HFA) 108 (90 Base) MCG/ACT inhaler Inhale 2 puffs by mouth into the lungs every 6 (six) hours as needed for wheezing. 11/04/21   Christen Butter, NP  azithromycin (ZITHROMAX Z-PAK) 250 MG tablet Take two pills today followed by one a day until gone 01/10/23   Eustace Moore, MD  fluticasone (FLOVENT HFA) 110 MCG/ACT inhaler Inhale 2 puffs into the lungs 2 (two) times daily. For cough 05/16/22   Christen Butter, NP  nitrofurantoin, macrocrystal-monohydrate, (MACROBID) 100 MG capsule Take 1 capsule (100 mg total) by mouth 2 (two) times daily for 5 days. 01/05/23 01/10/23  Viviano Simas, FNP  Norethindrone Acetate-Ethinyl Estrad-FE (BLISOVI 24 FE) 1-20 MG-MCG(24) tablet Take 1 tablet by mouth daily. 05/15/22   Christen Butter, NP  PARoxetine (PAXIL) 20 MG tablet Take 1 tablet (20 mg total) by mouth daily. 11/28/22   Christen Butter, NP  predniSONE (DELTASONE) 50 MG tablet Take once a day for 5 days.  Take with food 01/10/23  Eustace Moore, MD  pyridostigmine (MESTINON) 60 MG tablet Take 1 tablet twice daily (8am and 2pm) 11/20/22   Glendale Chard, DO    Family History Family History  Problem Relation Age of Onset   Hypertension Father    Cerebrovascular Disease Maternal Grandmother    Heart disease Paternal Grandmother    Myasthenia gravis Paternal Grandmother    Hypertension Paternal Grandfather     Social History Social History   Tobacco Use   Smoking status: Never   Smokeless tobacco: Never  Vaping Use   Vaping Use: Never used  Substance Use Topics   Alcohol use: Yes    Comment: Social Drinking   Drug use: No     Allergies   Fentanyl, Influenza vaccines, and Vicodin [hydrocodone-acetaminophen]   Review of Systems Review of Systems  See HPI Physical Exam Triage Vital Signs ED Triage Vitals  Enc Vitals Group     BP      Pulse      Resp       Temp      Temp src      SpO2      Weight      Height      Head Circumference      Peak Flow      Pain Score      Pain Loc      Pain Edu?      Excl. in GC?    No data found.  Updated Vital Signs LMP 01/01/2023 (Approximate)       Physical Exam Constitutional:      General: She is not in acute distress.    Appearance: She is well-developed and normal weight. She is not ill-appearing.  HENT:     Head: Normocephalic and atraumatic.     Right Ear: Tympanic membrane normal.     Left Ear: Tympanic membrane and ear canal normal.     Nose: Congestion and rhinorrhea present.     Mouth/Throat:     Pharynx: Posterior oropharyngeal erythema present.     Comments: Nasal membranes swollen and red. Eyes:     Conjunctiva/sclera: Conjunctivae normal.     Pupils: Pupils are equal, round, and reactive to light.  Cardiovascular:     Rate and Rhythm: Normal rate and regular rhythm.     Heart sounds: Normal heart sounds.  Pulmonary:     Effort: Pulmonary effort is normal. No respiratory distress.     Breath sounds: Normal breath sounds.  Abdominal:     General: There is no distension.     Palpations: Abdomen is soft.  Musculoskeletal:        General: Normal range of motion.     Cervical back: Normal range of motion.  Lymphadenopathy:     Cervical: No cervical adenopathy.  Skin:    General: Skin is warm and dry.  Neurological:     General: No focal deficit present.     Mental Status: She is alert.     Gait: Gait normal.      UC Treatments / Results  Labs (all labs ordered are listed, but only abnormal results are displayed) Labs Reviewed - No data to display  EKG   Radiology No results found.  Procedures Procedures (including critical care time)  Medications Ordered in UC Medications - No data to display  Initial Impression / Assessment and Plan / UC Course  I have reviewed the triage vital signs and the nursing notes.  Pertinent labs & imaging  results that were  available during my care of the patient were reviewed by me and considered in my medical decision making (see chart for details).     I explained to the patient that I do not agree with her bacterial sinusitis diagnosis in June.  She likely had a cold.  Perhaps exacerbated by allergies.  I explained to her that bacterial sinusitis is not usually diagnosed until 7 to 10 days of symptoms.  At this time I am going to give her some steroids for her allergy symptoms and encouraged her to have additional conservative management at home.  I will give her a Z-Pak to fill and take if she fails to improve in 7 to 10 days.  Follow-up with her PCP Final Clinical Impressions(s) / UC Diagnoses   Final diagnoses:  Allergic rhinitis, unspecified seasonality, unspecified trigger  Viral upper respiratory tract infection     Discharge Instructions      Take prednisone as directed Use Flonase as directed Continue allergy pill daily Fill and take azithromycin if you fail to improve in 7 to 10 days    ED Prescriptions   None    PDMP not reviewed this encounter.   Eustace Moore, MD 01/10/23 [phone]

## 2023-01-10 NOTE — Discharge Instructions (Signed)
Take prednisone as directed Use Flonase as directed Continue allergy pill daily Fill and take azithromycin if you fail to improve in 7 to 10 days

## 2023-01-11 NOTE — Addendum Note (Signed)
Addended byChristen Butter on: 01/11/2023 08:50 PM   Modules accepted: Orders

## 2023-01-22 MED ORDER — NORETHIN ACE-ETH ESTRAD-FE 1-20 MG-MCG PO TABS: 1.0000 | ORAL_TABLET | Freq: Every day | ORAL | 3 refills | Status: DC

## 2023-01-22 NOTE — Addendum Note (Signed)
Addended byChristen Butter on: 01/22/2023 12:56 PM   Modules accepted: Orders

## 2023-01-26 ENCOUNTER — Other Ambulatory Visit: Payer: Self-pay | Admitting: Medical-Surgical

## 2023-01-26 MED ORDER — ARMODAFINIL 250 MG PO TABS: 250.0000 mg | ORAL_TABLET | Freq: Every day | ORAL | 0 refills | Status: DC

## 2023-01-26 NOTE — Telephone Encounter (Signed)
Patient requesting armodafnil 250mg  Last written 01/10/2023 Last ov 09/18/2022 No upcoming appt schld.

## 2023-01-26 NOTE — Telephone Encounter (Signed)
Needs to schedule a follow up appointment for further refills as this is a controlled substance that requires frequent monitoring and documentation. Please contact patient to facilitate scheduling.

## 2023-01-29 NOTE — Telephone Encounter (Signed)
Called patient, LVM to call office to schedule appointment for controlled substance, thanks.

## 2023-02-12 ENCOUNTER — Other Ambulatory Visit: Payer: Self-pay | Admitting: Medical-Surgical

## 2023-02-14 NOTE — Telephone Encounter (Signed)
Due for follow up appointment.

## 2023-02-15 ENCOUNTER — Other Ambulatory Visit: Payer: Self-pay | Admitting: Medical-Surgical

## 2023-02-16 ENCOUNTER — Other Ambulatory Visit: Payer: Self-pay

## 2023-02-16 MED ORDER — ARMODAFINIL 250 MG PO TABS: 250.0000 mg | ORAL_TABLET | Freq: Every day | ORAL | 0 refills | Status: DC

## 2023-02-16 NOTE — Progress Notes (Signed)
LMVM to contact the office to schedule an appointment and I also sent a letter through MyChart.  I'm not able to refuse medication because it's a controlled substance.

## 2023-02-19 ENCOUNTER — Other Ambulatory Visit: Payer: Self-pay | Admitting: Medical-Surgical

## 2023-02-19 ENCOUNTER — Other Ambulatory Visit (HOSPITAL_COMMUNITY): Payer: Self-pay

## 2023-02-19 MED ORDER — ARMODAFINIL 250 MG PO TABS: 250.0000 mg | ORAL_TABLET | Freq: Every day | ORAL | 0 refills | Status: DC

## 2023-02-20 ENCOUNTER — Other Ambulatory Visit: Payer: Self-pay

## 2023-02-20 ENCOUNTER — Other Ambulatory Visit (HOSPITAL_COMMUNITY): Payer: Self-pay

## 2023-02-20 ENCOUNTER — Other Ambulatory Visit: Payer: Self-pay | Admitting: Medical-Surgical

## 2023-02-20 MED ORDER — ARMODAFINIL 250 MG PO TABS
250.0000 mg | ORAL_TABLET | Freq: Every day | ORAL | 0 refills | Status: DC
Start: 2023-02-20 — End: 2023-03-02
  Filled 2023-02-20: qty 30, 30d supply, fill #0

## 2023-02-20 NOTE — Progress Notes (Signed)
Appointment scheduled 03/02/2023

## 2023-03-02 ENCOUNTER — Encounter: Payer: Self-pay | Admitting: Medical-Surgical

## 2023-03-02 ENCOUNTER — Ambulatory Visit: Payer: 59 | Admitting: Medical-Surgical

## 2023-03-02 ENCOUNTER — Other Ambulatory Visit (HOSPITAL_COMMUNITY): Payer: Self-pay

## 2023-03-02 VITALS — BP 112/73 | HR 79 | Resp 20 | Ht 64.0 in | Wt 141.7 lb

## 2023-03-02 DIAGNOSIS — R5383 Other fatigue: Secondary | ICD-10-CM

## 2023-03-02 MED ORDER — ARMODAFINIL 250 MG PO TABS
250.0000 mg | ORAL_TABLET | Freq: Every day | ORAL | 1 refills | Status: DC
Start: 2023-03-22 — End: 2023-08-29
  Filled 2023-03-02 – 2023-03-22 (×2): qty 90, 90d supply, fill #0
  Filled 2023-06-22: qty 90, 90d supply, fill #1

## 2023-03-02 NOTE — Progress Notes (Signed)
        Established patient visit  History, exam, impression, and plan:  1. Chronic fatigue Pleasant 37 year old female presenting today with a history of chronic fatigue secondary to long COVID symptoms and myasthenia gravis.  She is currently being followed by neurology for myasthenia gravis and is on a stable regimen with Mestinon.  Continues to experience significant chronic fatigue.  She was started on armodafinil 250 mg daily.  Taking this medication as prescribed, tolerating well with no side effects.  Feels the medication is working extremely well for her.  She did go for a short time without it due to delay in refill and reports the chronic fatigue returned.  Without the medication, has difficulty with falling asleep during the day and has difficulty with regular daily activities.  As she is doing well on a stable regimen, continue armodafinil 250 mg daily.  Plan to follow-up in 6 months.   Procedures performed this visit: None.  Return in about 6 months (around 08/31/2023) for chronic fatigue follow up.  __________________________________ Thayer Ohm, DNP, APRN, FNP-BC Primary Care and Sports Medicine [facility] [place]

## 2023-03-20 ENCOUNTER — Other Ambulatory Visit: Payer: Self-pay | Admitting: Medical-Surgical

## 2023-03-20 DIAGNOSIS — Z8659 Personal history of other mental and behavioral disorders: Secondary | ICD-10-CM

## 2023-03-21 ENCOUNTER — Other Ambulatory Visit (HOSPITAL_COMMUNITY): Payer: Self-pay

## 2023-03-22 ENCOUNTER — Other Ambulatory Visit (HOSPITAL_COMMUNITY): Payer: Self-pay

## 2023-03-23 ENCOUNTER — Other Ambulatory Visit (HOSPITAL_COMMUNITY): Payer: Self-pay

## 2023-05-26 ENCOUNTER — Other Ambulatory Visit: Payer: Self-pay | Admitting: Medical-Surgical

## 2023-05-26 DIAGNOSIS — Z8659 Personal history of other mental and behavioral disorders: Secondary | ICD-10-CM

## 2023-05-28 ENCOUNTER — Encounter: Payer: Self-pay | Admitting: Neurology

## 2023-05-28 ENCOUNTER — Other Ambulatory Visit (HOSPITAL_COMMUNITY): Payer: Self-pay

## 2023-05-28 ENCOUNTER — Ambulatory Visit (INDEPENDENT_AMBULATORY_CARE_PROVIDER_SITE_OTHER): Payer: 59 | Admitting: Neurology

## 2023-05-28 ENCOUNTER — Encounter: Payer: Self-pay | Admitting: Medical-Surgical

## 2023-05-28 VITALS — BP 124/70 | HR 78 | Ht 64.0 in | Wt 140.0 lb

## 2023-05-28 DIAGNOSIS — G7000 Myasthenia gravis without (acute) exacerbation: Secondary | ICD-10-CM

## 2023-05-28 MED ORDER — PAROXETINE HCL 20 MG PO TABS
20.0000 mg | ORAL_TABLET | Freq: Every day | ORAL | 0 refills | Status: DC
  Filled 2023-05-28: qty 90, 90d supply, fill #0

## 2023-05-28 NOTE — Progress Notes (Signed)
Follow-up Visit   Date: 05/28/23   Robin Robertson MRN: [id number] DOB: 1985/10/01   Interim History: Robin Robertson is a 37 y.o. right-handed Caucasian female with seropositive gravis (2013) returning to the clinic for follow-up of myasthenia gravis.  The patient was accompanied to the clinic by self.   IMPRESSION/PLAN: Seropositive myasthenia gravis with mild exacerbation (right ptosis), diagnosed 2013. Exam shows right ptosis (stable).   Symptoms have been manageable on mestinon alone.  She has not been on prednisone.  - Continue mestinon 60mg  daily + extra dose as needed  Return to clinic in 1 year  -----------------------------------------------------------  History of present illness: She was diagnosed with MG by antibody testing in ~ 2013 when living in [place], [place].  These records are not available to review.  Fortunately, her symptoms have predominately been ocular and she has not needed anything more than mestinon 60mg  2-3 times daily.  She reports having has generalized fatigue, right eye droopiness, shortness of breath, palpitation, headaches.  She sleeps on one pillow. She tends to get a lot of URI and feels that her weakness is worse when sick.    She takes mestinon 60mg  twice daily.  She has never been on prednisone.    She had never had MG crisis, required IVIG, or plasmapheresis.   UPDATE 09/23/2021:  She is here for 6 month visit. Overall, her MG is well-controlled. She complains of arm heaviness where she is unable to blow dry her hair, but does not have difficulty with other ADLs.  She has noticed that usually at the end of her work-day, she has difficulty focusing with her eyes, denies double vision.  No problems with speech or swallow. She continues to have generalized fatigue and intermittent shortness of breath. CT chest was negative for thymoma.   UPDATE 05/12/2022:  She is here for follow-up visit.  She increased the dose of mestinon 60mg  three  times daily for about a month, which improved her weakness and fatigue.  She was able to stay on mestinon 60mg  twice daily and takes an extra dose 2-3 times per month.  No double vision, difficulty swallowing/talking.  She has fallen 3-4 times in the past month, which has always occurred on steps. No associated leg or back pain.  No lightheadedness or vision changes. She feels that her legs are weak.  She sprained her right ankle and was using crutches until recently.    UPDATE 11/20/2022:  She reports having some increased twitches of the right eye. 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  She had COVID in late December/early January and had some cognitive issues related to that.  PCP gave her modafinil which has helped her fatigue. Since doing this, she is taking mestinon only in the morning.  She was previously taking mestinon 60mg  twice daily. No new MG complaints.   UPDATE 05/28/2023:   She is here for follow-up.  She takes mestinon 60mg  daily and an extra dose as needed (about once per week).  She denies double vision or limb weakness.  She has been doing well.  Her PCP started modafinil for fatigue and she feels that her energy and cognition has improved.  Medications:  Current Outpatient Medications on File Prior to Visit  Medication Sig Dispense Refill   albuterol (VENTOLIN HFA) 108 (90 Base) MCG/ACT inhaler Inhale 2 puffs by mouth into the lungs every 6 (six) hours as needed for wheezing. 8.5 g 5   Armodafinil  250 MG tablet Take 1 tablet (250 mg total) by mouth daily. 90 tablet 1   fluticasone (FLOVENT HFA) 110 MCG/ACT inhaler Inhale 2 puffs into the lungs 2 (two) times daily. For cough 1 each 0   norethindrone-ethinyl estradiol-FE (LOESTRIN FE) 1-20 MG-MCG tablet Take 1 tablet by mouth daily. 84 tablet 3   PARoxetine (PAXIL) 20 MG tablet Take 1 tablet (20 mg total) by mouth daily. 90 tablet 0   pyridostigmine (MESTINON) 60 MG tablet Take 1 tablet twice daily  (8am and 2pm) (Patient taking differently: Take 1 tablet once a day. Take second dose if needed.) 180 tablet 3   azithromycin (ZITHROMAX Z-PAK) 250 MG tablet Take two pills today followed by one a day until gone (Patient not taking: Reported on 05/28/2023) 6 tablet 0   No current facility-administered medications on file prior to visit.    Allergies:  Allergies  Allergen Reactions   Fentanyl Anaphylaxis    Stopped breathing, heart rate dropped to 8bpm   Influenza Vaccines Shortness Of Breath    Chest pain, Left side of body went numb for 2 weeks   Vicodin [Hydrocodone-Acetaminophen] Rash    Anger and Black outs    Vital Signs:  BP 124/70   Pulse 78   Ht 5\' 4"  (1.626 m)   Wt 140 lb (63.5 kg)   SpO2 99%   BMI 24.03 kg/m    Neurological Exam: MENTAL STATUS including orientation to time, place, person, recent and remote memory, attention span and concentration, language, and fund of knowledge is normal.  Speech is not dysarthric.  CRANIAL NERVES: Pupils equal round and reactive to light.  Normal conjugate, extra-ocular eye movements in all directions of gaze.  Moderate right ptosis at baseline, no worsening with sustained upgaze.  Face is symmetric. Palate elevates symmetrically.  Tongue is midline.  MOTOR:  Motor strength is 5/5 in all extremities.  No atrophy, fasciculations or abnormal movements.  No pronator drift.  Tone is normal.    COORDINATION/GAIT:   She is able to stand up from chair without using arms.   Gait narrow based and stable.   Data: CT chest w contrast 02/06/2021:  No thymoma   Thank you for allowing me to participate in patient's care.  If I can answer any additional questions, I would be pleased to do so.    Sincerely,    Hannelore Bova K. Allena Katz, DO

## 2023-06-22 ENCOUNTER — Other Ambulatory Visit (HOSPITAL_COMMUNITY): Payer: Self-pay

## 2023-08-15 ENCOUNTER — Other Ambulatory Visit: Payer: Self-pay

## 2023-08-15 ENCOUNTER — Ambulatory Visit
Admission: RE | Admit: 2023-08-15 | Discharge: 2023-08-15 | Disposition: A | Discharge status: Home / Self Care | Payer: 59 | Source: Ambulatory Visit | Attending: Physician Assistant | Admitting: Physician Assistant

## 2023-08-15 VITALS — BP 116/74 | HR 108 | Temp 100.2°F | Resp 16

## 2023-08-15 DIAGNOSIS — J111 Influenza due to unidentified influenza virus with other respiratory manifestations: Secondary | ICD-10-CM

## 2023-08-15 LAB — POCT INFLUENZA A/B
Influenza A, POC: POSITIVE — AB
Influenza B, POC: NEGATIVE

## 2023-08-15 MED ORDER — OSELTAMIVIR PHOSPHATE 75 MG PO CAPS: 75.0000 mg | ORAL_CAPSULE | Freq: Two times a day (BID) | ORAL | 0 refills | Status: AC

## 2023-08-15 NOTE — ED Triage Notes (Addendum)
Sick since Monday night and has had low grade fever, body aches, chills, cough productive sputum, lethargy, dizzy. Has been taking dayquil, nyquil, albuterol inhaler.

## 2023-08-15 NOTE — ED Provider Notes (Signed)
[facility]    CSN: [id number] Arrival date & time: 08/15/23  1047      History   Chief Complaint Chief Complaint  Patient presents with   Influenza    HPI Robin Robertson is a 38 y.o. female.   Patient complains of cough congestion and bodyaches.  Patient has a past medical history of asthma she has been using her albuterol inhaler.  Patient states that she also has myasthenia gravis.  Patient has been exposed to influenza and COVID.  Patient denies any nausea or vomiting.  She is able to eat and drink.  The history is provided by the patient.  Influenza   Past Medical History:  Diagnosis Date   Headache    Myasthenia gravis (HCC)    Myasthenia gravis (HCC)    Post partum depression     Patient Active Problem List   Diagnosis Date Noted   Brain fog 09/18/2022   Women's annual routine gynecological examination 09/10/2019   Migraine without aura and without status migrainosus, not intractable 08/05/2018   Depression 08/05/2018   Fatigue 06/14/2018   Postpartum depression 02/18/2018   Rubella non-immune status, antepartum 07/16/2017   Myasthenia gravis (HCC) 11/24/2014    Past Surgical History:  Procedure Laterality Date   WISDOM TOOTH EXTRACTION      OB History     Gravida  2   Para  2   Term  2   Preterm      AB      Living  2      SAB      IAB      Ectopic      Multiple  0   Live Births  1            Home Medications    Prior to Admission medications   Medication Sig Start Date End Date Taking? Authorizing Provider  oseltamivir (TAMIFLU) 75 MG capsule Take 1 capsule (75 mg total) by mouth 2 (two) times daily for 5 days. 08/15/23 08/20/23 Yes Elson Areas, PA-C  albuterol (VENTOLIN HFA) 108 (90 Base) MCG/ACT inhaler Inhale 2 puffs by mouth into the lungs every 6 (six) hours as needed for wheezing. 11/04/21   Christen Butter, NP  Armodafinil 250 MG tablet Take 1 tablet (250 mg total) by mouth daily. 03/22/23   Christen Butter, NP  azithromycin (ZITHROMAX Z-PAK) 250 MG tablet Take two pills today followed by one a day until gone Patient not taking: Reported on 05/28/2023 01/10/23   Eustace Moore, MD  fluticasone (FLOVENT HFA) 110 MCG/ACT inhaler Inhale 2 puffs into the lungs 2 (two) times daily. For cough 05/16/22   Christen Butter, NP  norethindrone-ethinyl estradiol-FE (LOESTRIN FE) 1-20 MG-MCG tablet Take 1 tablet by mouth daily. 01/22/23   Christen Butter, NP  PARoxetine (PAXIL) 20 MG tablet Take 1 tablet (20 mg total) by mouth daily. 05/28/23   Christen Butter, NP  pyridostigmine (MESTINON) 60 MG tablet Take 1 tablet twice daily (8am and 2pm) Patient taking differently: Take 1 tablet once a day. Take second dose if needed. 11/20/22   Glendale Chard, DO    Family History Family History  Problem Relation Age of Onset   Hypertension Father    Cerebrovascular Disease Maternal Grandmother    Heart disease Paternal Grandmother    Myasthenia gravis Paternal Grandmother    Hypertension Paternal Grandfather     Social History Social History   Tobacco Use   Smoking status: Never  Smokeless tobacco: Never  Vaping Use   Vaping status: Never Used  Substance Use Topics   Alcohol use: Yes    Comment: Social Drinking   Drug use: No     Allergies   Fentanyl, Influenza vaccines, and Vicodin [hydrocodone-acetaminophen]   Review of Systems Review of Systems  All other systems reviewed and are negative.    Physical Exam Triage Vital Signs ED Triage Vitals  Encounter Vitals Group     BP 08/15/23 1119 116/74     Systolic BP Percentile --      Diastolic BP Percentile --      Pulse Rate 08/15/23 1119 (!) 108     Resp 08/15/23 1119 16     Temp 08/15/23 1119 100.2 F (37.9 C)     Temp src --      SpO2 08/15/23 1119 97 %     Weight --      Height --      Head Circumference --      Peak Flow --      Pain Score 08/15/23 1121 3     Pain Loc --      Pain Education --      Exclude from Growth Chart --     No data found.  Updated Vital Signs BP 116/74   Pulse (!) 108   Temp 100.2 F (37.9 C)   Resp 16   LMP 08/06/2023 (Approximate)   SpO2 97%   Visual Acuity Right Eye Distance:   Left Eye Distance:   Bilateral Distance:    Right Eye Near:   Left Eye Near:    Bilateral Near:     Physical Exam Vitals and nursing note reviewed.  Constitutional:      Appearance: She is well-developed.  HENT:     Head: Normocephalic.  Cardiovascular:     Rate and Rhythm: Normal rate.  Pulmonary:     Effort: Pulmonary effort is normal.  Abdominal:     General: There is no distension.  Musculoskeletal:        General: Normal range of motion.     Cervical back: Normal range of motion.  Skin:    General: Skin is warm.  Neurological:     General: No focal deficit present.     Mental Status: She is alert and oriented to person, place, and time.      UC Treatments / Results  Labs (all labs ordered are listed, but only abnormal results are displayed) Labs Reviewed  POCT INFLUENZA A/B - Abnormal; Notable for the following components:      Result Value   Influenza A, POC Positive (*)    All other components within normal limits    EKG   Radiology No results found.  Procedures Procedures (including critical care time)  Medications Ordered in UC Medications - No data to display  Initial Impression / Assessment and Plan / UC Course  I have reviewed the triage vital signs and the nursing notes.  Pertinent labs & imaging results that were available during my care of the patient were reviewed by me and considered in my medical decision making (see chart for details).     Patient is influenza A positive.  Patient is given a prescription for Tamiflu I advised her to continue her albuterol inhaler.  She is advised to return if symptoms worsen or change. Final Clinical Impressions(s) / UC Diagnoses   Final diagnoses:  Influenza     Discharge Instructions  Continue  albuterol.  Return if any problems.    ED Prescriptions     Medication Sig Dispense Auth. Provider   oseltamivir (TAMIFLU) 75 MG capsule Take 1 capsule (75 mg total) by mouth 2 (two) times daily for 5 days. 10 capsule Elson Areas, [place]      PDMP not reviewed this encounter. An After Visit Summary was printed and given to the patient.       Elson Areas, [place] 08/15/23 1216

## 2023-08-15 NOTE — Discharge Instructions (Signed)
Continue albuterol.  Return if any problems.

## 2023-08-23 ENCOUNTER — Other Ambulatory Visit: Payer: Self-pay | Admitting: Medical-Surgical

## 2023-08-23 DIAGNOSIS — Z8659 Personal history of other mental and behavioral disorders: Secondary | ICD-10-CM

## 2023-08-24 ENCOUNTER — Other Ambulatory Visit (HOSPITAL_COMMUNITY): Payer: Self-pay

## 2023-08-24 MED ORDER — PAROXETINE HCL 20 MG PO TABS
20.0000 mg | ORAL_TABLET | Freq: Every day | ORAL | 0 refills | Status: DC
Start: 2023-08-24 — End: 2023-08-29
  Filled 2023-08-24: qty 90, 90d supply, fill #0

## 2023-08-29 ENCOUNTER — Ambulatory Visit: Payer: 59 | Admitting: Medical-Surgical

## 2023-08-29 ENCOUNTER — Other Ambulatory Visit (HOSPITAL_COMMUNITY): Payer: Self-pay

## 2023-08-29 ENCOUNTER — Other Ambulatory Visit: Payer: Self-pay

## 2023-08-29 ENCOUNTER — Encounter: Payer: Self-pay | Admitting: Medical-Surgical

## 2023-08-29 VITALS — BP 108/76 | HR 78 | Resp 20 | Ht 64.0 in | Wt 141.2 lb

## 2023-08-29 DIAGNOSIS — Z13220 Encounter for screening for lipoid disorders: Secondary | ICD-10-CM | POA: Diagnosis not present

## 2023-08-29 DIAGNOSIS — Z8659 Personal history of other mental and behavioral disorders: Secondary | ICD-10-CM | POA: Diagnosis not present

## 2023-08-29 DIAGNOSIS — Z0000 Encounter for general adult medical examination without abnormal findings: Secondary | ICD-10-CM | POA: Diagnosis not present

## 2023-08-29 DIAGNOSIS — G7000 Myasthenia gravis without (acute) exacerbation: Secondary | ICD-10-CM | POA: Diagnosis not present

## 2023-08-29 DIAGNOSIS — R87610 Atypical squamous cells of undetermined significance on cytologic smear of cervix (ASC-US): Secondary | ICD-10-CM | POA: Diagnosis not present

## 2023-08-29 DIAGNOSIS — R5383 Other fatigue: Secondary | ICD-10-CM | POA: Diagnosis not present

## 2023-08-29 MED ORDER — ALBUTEROL SULFATE HFA 108 (90 BASE) MCG/ACT IN AERS
2.0000 | INHALATION_SPRAY | Freq: Four times a day (QID) | RESPIRATORY_TRACT | 5 refills | Status: AC | PRN
  Filled 2023-08-29: qty 6.7, 20d supply, fill #0

## 2023-08-29 MED ORDER — NORETHIN ACE-ETH ESTRAD-FE 1-20 MG-MCG PO TABS
1.0000 | ORAL_TABLET | Freq: Every day | ORAL | 3 refills | Status: DC
  Filled 2023-08-29: qty 84, 84d supply, fill #0
  Filled 2023-11-28: qty 84, 84d supply, fill #1

## 2023-08-29 MED ORDER — ARMODAFINIL 250 MG PO TABS
250.0000 mg | ORAL_TABLET | Freq: Every day | ORAL | 1 refills | Status: DC
Start: 2023-08-29 — End: 2024-02-13
  Filled 2023-08-29: qty 90, 90d supply, fill #0
  Filled 2023-10-03: qty 30, 30d supply, fill #0
  Filled 2023-11-07: qty 30, 30d supply, fill #1
  Filled 2023-12-06: qty 30, 30d supply, fill #2
  Filled 2024-01-09: qty 30, 30d supply, fill #3
  Filled 2024-02-08: qty 30, 30d supply, fill #4

## 2023-08-29 MED ORDER — PAROXETINE HCL 20 MG PO TABS
20.0000 mg | ORAL_TABLET | Freq: Every day | ORAL | 3 refills | Status: DC
  Filled 2023-08-29 – 2023-11-28 (×2): qty 90, 90d supply, fill #0

## 2023-08-29 NOTE — Progress Notes (Unsigned)
 Annual Wellness Visit     Patient: Robin Robertson, Female    DOB: February 20, 1986, 38 y.o.   MRN: [id number]  Subjective  Chief Complaint  Patient presents with   Annual Exam   Depression   Fatigue    Robin Robertson is a 38 y.o. female who presents today for her Annual Wellness Visit. She reports consuming a general diet. Home exercise routine includes walking  and bicycle and 3  hrs per week and . Exercise is limited by respiratory condition(s): and weather. She generally feels well. She reports sleeping fairly well. She does have additional problems to discuss today.   Vision:Within last year and Dental: No current dental problems and Last dental visit: September 2024   Patient Active Problem List   Diagnosis Date Noted   Brain fog 09/18/2022   Migraine without aura and without status migrainosus, not intractable 08/05/2018   Depression 08/05/2018   Fatigue 06/14/2018   Postpartum depression 02/18/2018   Rubella non-immune status, antepartum 07/16/2017   Myasthenia gravis (HCC) 11/24/2014   Past Medical History:  Diagnosis Date   Headache    Myasthenia gravis (HCC)    Myasthenia gravis (HCC)    Post partum depression    Past Surgical History:  Procedure Laterality Date   WISDOM TOOTH EXTRACTION     Social History   Tobacco Use   Smoking status: Never   Smokeless tobacco: Never  Vaping Use   Vaping status: Never Used  Substance Use Topics   Alcohol use: Yes    Comment: Social Drinking   Drug use: No   Social History   Socioeconomic History   Marital status: Married    Spouse name: Not on file   Number of children: 1   Years of education: associates   Highest education level: Associate degree: academic program  Occupational History   Occupation: [employer]  Tobacco Use   Smoking status: Never   Smokeless tobacco: Never  Vaping Use   Vaping status: Never Used  Substance and Sexual Activity   Alcohol use: Yes    Comment: Social Drinking    Drug use: No   Sexual activity: Yes    Partners: Male    Birth control/protection: Pill  Other Topics Concern   Not on file  Social History Narrative   Patient drinks 4-5 cups of caffeine weekly.   Patient is right handed.   Has child, CHLOE.      Lives in a two story home   Social Drivers of Health   Financial Resource Strain: Low Risk  (02/26/2023)   Overall Financial Resource Strain (CARDIA)    Difficulty of Paying Living Expenses: Not very hard  Food Insecurity: No Food Insecurity (02/26/2023)   Hunger Vital Sign    Worried About Running Out of Food in the Last Year: Never true    Ran Out of Food in the Last Year: Never true  Transportation Needs: No Transportation Needs (02/26/2023)   PRAPARE - Administrator, Civil Service (Medical): No    Lack of Transportation (Non-Medical): No  Physical Activity: Insufficiently Active (02/26/2023)   Exercise Vital Sign    Days of Exercise per Week: 3 days    Minutes of Exercise per Session: 20 min  Stress: Stress Concern Present (02/26/2023)   [employer] of Occupational Health - Occupational Stress Questionnaire    Feeling of Stress : To some extent  Social Connections: Socially Integrated (02/26/2023)   Social Connection and Isolation Panel [  NHANES]    Frequency of Communication with Friends and Family: More than three times a week    Frequency of Social Gatherings with Friends and Family: Three times a week    Attends Religious Services: 1 to 4 times per year    Active Member of Clubs or Organizations: Yes    Attends Banker Meetings: More than 4 times per year    Marital Status: Married  Catering manager Violence: Unknown (10/07/2021)   Received from [employer], [facility]   HITS    Physically Hurt: Not on file    Insult or Talk Down To: Not on file    Threaten Physical Harm: Not on file    Scream or Curse: Not on file   Family Status  Relation Name Status   Mother  Alive       spinal  tumor-benign   Father  Alive   MGM  (Not Specified)   PGM  (Not Specified)   PGF  (Not Specified)   Brother  Alive  No partnership data on file   Family History  Problem Relation Age of Onset   Hypertension Father    Cerebrovascular Disease Maternal Grandmother    Heart disease Paternal Grandmother    Myasthenia gravis Paternal Grandmother    Hypertension Paternal Grandfather    Allergies  Allergen Reactions   Fentanyl Anaphylaxis    Stopped breathing, heart rate dropped to 8bpm   Influenza Vaccines Shortness Of Breath    Chest pain, Left side of body went numb for 2 weeks   Vicodin [Hydrocodone-Acetaminophen] Rash    Anger and Black outs      Medications: Outpatient Medications Prior to Visit  Medication Sig   fluticasone (FLOVENT HFA) 110 MCG/ACT inhaler Inhale 2 puffs into the lungs 2 (two) times daily. For cough   pyridostigmine (MESTINON) 60 MG tablet Take 1 tablet twice daily (8am and 2pm) (Patient taking differently: Take 1 tablet once a day. Take second dose if needed.)   [DISCONTINUED] albuterol (VENTOLIN HFA) 108 (90 Base) MCG/ACT inhaler Inhale 2 puffs by mouth into the lungs every 6 (six) hours as needed for wheezing.   [DISCONTINUED] Armodafinil 250 MG tablet Take 1 tablet (250 mg total) by mouth daily.   [DISCONTINUED] norethindrone-ethinyl estradiol-FE (LOESTRIN FE) 1-20 MG-MCG tablet Take 1 tablet by mouth daily.   [DISCONTINUED] PARoxetine (PAXIL) 20 MG tablet Take 1 tablet (20 mg total) by mouth daily.   [DISCONTINUED] azithromycin (ZITHROMAX Z-PAK) 250 MG tablet Take two pills today followed by one a day until gone (Patient not taking: Reported on 05/28/2023)   No facility-administered medications prior to visit.    Allergies  Allergen Reactions   Fentanyl Anaphylaxis    Stopped breathing, heart rate dropped to 8bpm   Influenza Vaccines Shortness Of Breath    Chest pain, Left side of body went numb for 2 weeks   Vicodin [Hydrocodone-Acetaminophen] Rash     Anger and Black outs    Patient Care Team: Christen Butter, NP as PCP - General (Nurse Practitioner) Glendale Chard, DO as Consulting Physician (Neurology)  Review of Systems  Constitutional: Negative.   HENT:  Positive for congestion.   Eyes: Negative.        Right eye ptosis  Respiratory: Negative.    Cardiovascular: Negative.   Gastrointestinal: Negative.   Genitourinary: Negative.   Musculoskeletal: Negative.   Skin: Negative.   Neurological: Negative.        Distant history of Katheran Awe.  Has current history of treatment for myasthenia gravis.  Currently under treatment for Chronic fatigue syndrome.  Endo/Heme/Allergies: Negative.   Psychiatric/Behavioral:  Negative for memory loss, substance abuse and suicidal ideas.        History of anxiety depression takes Paxil daily.        Objective  BP 108/76 (BP Location: Left Arm, Cuff Size: Normal)   Pulse 78   Resp 20   Ht 5\' 4"  (1.626 m)   Wt 141 lb 3.2 oz (64 kg)   LMP 08/06/2023 (Approximate)   SpO2 100%   BMI 24.24 kg/m  BP Readings from Last 3 Encounters:  08/29/23 108/76  08/15/23 116/74  05/28/23 124/70   Wt Readings from Last 3 Encounters:  08/29/23 141 lb 3.2 oz (64 kg)  05/28/23 140 lb (63.5 kg)  03/02/23 141 lb 11.2 oz (64.3 kg)   SpO2 Readings from Last 3 Encounters:  08/29/23 100%  08/15/23 97%  05/28/23 99%      Physical Exam Constitutional:      Appearance: She is normal weight.  HENT:     Head: Normocephalic and atraumatic.     Right Ear: Tympanic membrane normal.     Left Ear: Tympanic membrane normal.     Nose:     Comments: Patient recovering from influenza A approximately 2 weeks ago and has some sinus congestion.    Mouth/Throat:     Mouth: Mucous membranes are moist.     Pharynx: Oropharynx is clear.  Eyes:     Extraocular Movements: Extraocular movements intact.     Conjunctiva/sclera: Conjunctivae normal.     Pupils: Pupils are equal, round, and reactive to light.      Comments: Right eye ptosis  Cardiovascular:     Rate and Rhythm: Normal rate.     Pulses: Normal pulses.     Heart sounds: Normal heart sounds.  Pulmonary:     Effort: Pulmonary effort is normal.     Breath sounds: Normal breath sounds.  Abdominal:     General: Abdomen is flat. Bowel sounds are normal.     Palpations: Abdomen is soft.  Musculoskeletal:        General: Normal range of motion.     Cervical back: Normal range of motion and neck supple.  Skin:    General: Skin is warm and dry.     Capillary Refill: Capillary refill takes less than 2 seconds.  Neurological:     General: No focal deficit present.     Mental Status: She is alert and oriented to person, place, and time. Mental status is at baseline.  Psychiatric:        Mood and Affect: Mood normal.        Behavior: Behavior normal.        Thought Content: Thought content normal.        Judgment: Judgment normal.       Most recent fall risk assessment:    08/29/2023   10:51 AM  Fall Risk   Falls in the past year? 1  Number falls in past yr: 0  Injury with Fall? 1  Risk for fall due to : History of fall(s)  Follow up Falls evaluation completed    Most recent depression screenings:    08/29/2023   10:52 AM 09/18/2022    3:41 PM  PHQ 2/9 Scores  PHQ - 2 Score 0 0  PHQ- 9 Score 4 12   Most recent cognitive screening:  Most recent Audit-C  alcohol use screening    02/26/2023   10:13 AM  Alcohol Use Disorder Test (AUDIT)  1. How often do you have a drink containing alcohol? 3  2. How many drinks containing alcohol do you have on a typical day when you are drinking? 0  3. How often do you have six or more drinks on one occasion? 1  AUDIT-C Score 4   4. How often during the last year have you found that you were not able to stop drinking once you had started? 0  5. How often during the last year have you failed to do what was normally expected from you because of drinking? 0  6. How often during the  last year have you needed a first drink in the morning to get yourself going after a heavy drinking session? 0  7. How often during the last year have you had a feeling of guilt of remorse after drinking? 0  8. How often during the last year have you been unable to remember what happened the night before because you had been drinking? 0  9. Have you or someone else been injured as a result of your drinking? 0  10. Has a relative or friend or a doctor or another health worker been concerned about your drinking or suggested you cut down? 0  Alcohol Use Disorder Identification Test Final Score (AUDIT) 4      Patient-reported   A score of 3 or more in women, and 4 or more in men indicates increased risk for alcohol abuse, EXCEPT if all of the points are from question 1    Last CBC Lab Results  Component Value Date   WBC 6.9 09/18/2022   HGB 13.8 09/18/2022   HCT 40.8 09/18/2022   MCV 88 09/18/2022   MCH 29.9 09/18/2022   RDW 12.5 09/18/2022   PLT 288 09/18/2022   Last metabolic panel Lab Results  Component Value Date   GLUCOSE 118 (H) 09/18/2022   NA 141 09/18/2022   K 4.0 09/18/2022   CL 104 09/18/2022   CO2 21 09/18/2022   BUN 11 09/18/2022   CREATININE 0.71 09/18/2022   EGFR 113 09/18/2022   CALCIUM 9.2 09/18/2022   PROT 6.8 09/18/2022   ALBUMIN 4.5 09/18/2022   LABGLOB 2.3 09/18/2022   AGRATIO 2.0 09/18/2022   BILITOT 0.4 09/18/2022   ALKPHOS 56 09/18/2022   AST 13 09/18/2022   ALT 16 09/18/2022   Last lipids Lab Results  Component Value Date   CHOL 166 05/15/2022   HDL 63 05/15/2022   LDLCALC 83 05/15/2022   TRIG 104 05/15/2022   CHOLHDL 2.6 05/15/2022   Last thyroid functions Lab Results  Component Value Date   TSH 0.707 09/18/2022   T3TOTAL 83.0 11/02/2016   Last vitamin D Lab Results  Component Value Date   VD25OH 35.5 09/18/2022   Last vitamin B12 and Folate Lab Results  Component Value Date   VITAMINB12 340 09/18/2022      No results found  for any visits on 08/29/23.    Assessment & Plan   Annual wellness visit done today including the all of the following: Reviewed patient's Family Medical History Reviewed and updated list of patient's medical providers Assessment of cognitive impairment was done Assessed patient's functional ability Established a written schedule for health screening services Health Risk Assessent Completed and Reviewed  Exercise Activities and Dietary recommendations  Goals   None     Immunization History  Administered Date(s)  Administered   MMR 02/02/2018   PFIZER(Purple Top)SARS-COV-2 Vaccination 07/01/2019, 07/22/2019   Tdap 03/17/2015, 11/16/2017    Health Maintenance  Topic Date Due   COVID-19 Vaccine (3 - 2024-25 season) 09/14/2023 (Originally 03/04/2023)   INFLUENZA VACCINE  10/18/2098 (Originally 02/01/2023)   Cervical Cancer Screening (HPV/Pap Cotest)  10/27/2025   DTaP/Tdap/Td (3 - Td or Tdap) 11/17/2027   Hepatitis C Screening  Completed   HIV Screening  Completed   HPV VACCINES  Aged Out     Discussed health benefits of physical activity, and encouraged her to engage in regular exercise appropriate for her age and condition.    Problem List Items Addressed This Visit       Nervous and Auditory   Myasthenia gravis (HCC) - Primary   Being managed by neurology Dr. Nita Sickle.  Patient reports that is well-managed and the only complaint is chronic slight right eyelid ptosis. Relevant Medications   - Armodafinil 250 MG tablet; Take 1 tablet (250 mg total) by mouth daily.  Dispense: 90 tablet; Refill: 1.     Other   Fatigue   In addition to yearly exam patient is present for medication follow-up and management of chronic fatigue.  Patient reports taking medication as directed and that chronic fatigue is well-managed with Armodafinil.  Patient is not answered and changing chronic fatigue medication regimen at this time.  Patient states that medication has been life-changing.   Plan to continue and refill armodafinil. Relevant Medications.   - Armodafinil 250 MG tablet; Take 1 tablet (250 mg total) by mouth daily.  Dispense: 90 tablet; Refill: 1      History of depression       Patient states anxiety and depression is well-managed denies SI/HI.  No changes to regimen at this time refill paroxetine. Relevant Medications   - PARoxetine (PAXIL) 20 MG tablet; Take 1 tablet (20 mg total) by mouth daily.  Dispense: 90 tablet; Refill: 3      Annual physical exam       Patient has not had CBC or CMP drawn since 2023.  Will draw CBC and CMP and lipid panel.   Relevant Orders   Lipid panel   CBC with Differential/Platelet   CMP14+EGFR     Lipid screening       Per USPSTF guidelines will screen for hyperlipidemia.  Relevant Orders   Lipid panel     Atypical squamous cells of undetermined significance on cytologic smear of cervix (ASC-US)     Patient to schedule PAP smear/cervical exam in the next 2 months    Return in about 6 months (around 02/26/2024) for chronic disease follow up.  Patient due for PAP smear in 2 months.  Per USPSTF guidelines will screen for hyperlipidemia.     Loma Sousa, RN

## 2023-08-30 ENCOUNTER — Encounter: Payer: Self-pay | Admitting: Medical-Surgical

## 2023-08-30 LAB — LIPID PANEL
Chol/HDL Ratio: 2.3 ratio (ref 0.0–4.4)
Cholesterol, Total: 152 mg/dL (ref 100–199)
HDL: 65 mg/dL
LDL Chol Calc (NIH): 69 mg/dL (ref 0–99)
Triglycerides: 98 mg/dL (ref 0–149)
VLDL Cholesterol Cal: 18 mg/dL (ref 5–40)

## 2023-08-30 LAB — CBC WITH DIFFERENTIAL/PLATELET
Basophils Absolute: 0 10*3/uL (ref 0.0–0.2)
Basos: 0 %
EOS (ABSOLUTE): 0.2 10*3/uL (ref 0.0–0.4)
Eos: 2 %
Hematocrit: 39.9 % (ref 34.0–46.6)
Hemoglobin: 13 g/dL (ref 11.1–15.9)
Immature Grans (Abs): 0 10*3/uL (ref 0.0–0.1)
Immature Granulocytes: 0 %
Lymphocytes Absolute: 2.4 10*3/uL (ref 0.7–3.1)
Lymphs: 26 %
MCH: 29.7 pg (ref 26.6–33.0)
MCHC: 32.6 g/dL (ref 31.5–35.7)
MCV: 91 fL (ref 79–97)
Monocytes Absolute: 0.5 10*3/uL (ref 0.1–0.9)
Monocytes: 5 %
Neutrophils Absolute: 6.3 10*3/uL (ref 1.4–7.0)
Neutrophils: 67 %
Platelets: 393 10*3/uL (ref 150–450)
RBC: 4.37 x10E6/uL (ref 3.77–5.28)
RDW: 12.1 % (ref 11.7–15.4)
WBC: 9.4 10*3/uL (ref 3.4–10.8)

## 2023-08-30 LAB — CMP14+EGFR
ALT: 14 [IU]/L (ref 0–32)
AST: 12 [IU]/L (ref 0–40)
Albumin: 4.3 g/dL (ref 3.9–4.9)
Alkaline Phosphatase: 50 [IU]/L (ref 44–121)
BUN/Creatinine Ratio: 19 (ref 9–23)
BUN: 13 mg/dL (ref 6–20)
Bilirubin Total: 0.4 mg/dL (ref 0.0–1.2)
CO2: 21 mmol/L (ref 20–29)
Calcium: 9.6 mg/dL (ref 8.7–10.2)
Chloride: 106 mmol/L (ref 96–106)
Creatinine, Ser: 0.68 mg/dL (ref 0.57–1.00)
Globulin, Total: 2.1 g/dL (ref 1.5–4.5)
Glucose: 89 mg/dL (ref 70–99)
Potassium: 4.2 mmol/L (ref 3.5–5.2)
Sodium: 142 mmol/L (ref 134–144)
Total Protein: 6.4 g/dL (ref 6.0–8.5)
eGFR: 115 mL/min/{1.73_m2}

## 2023-08-30 NOTE — Progress Notes (Signed)
 Medical screening examination/treatment was performed by qualified clinical staff member and as supervising provider I was immediately available for consultation/collaboration. I have reviewed documentation and agree with assessment and plan.  Thayer Ohm, DNP, APRN, FNP-BC [facility]

## 2023-10-03 ENCOUNTER — Other Ambulatory Visit (HOSPITAL_COMMUNITY): Payer: Self-pay

## 2023-10-10 ENCOUNTER — Other Ambulatory Visit (HOSPITAL_COMMUNITY)
Admission: RE | Admit: 2023-10-10 | Discharge: 2023-10-10 | Disposition: A | Discharge status: Home / Self Care | Source: Ambulatory Visit | Attending: Medical-Surgical | Admitting: Medical-Surgical

## 2023-10-10 ENCOUNTER — Encounter: Payer: Self-pay | Admitting: Medical-Surgical

## 2023-10-10 ENCOUNTER — Ambulatory Visit: Payer: 59 | Admitting: Medical-Surgical

## 2023-10-10 VITALS — BP 109/67 | HR 76 | Resp 20 | Ht 64.0 in | Wt 145.5 lb

## 2023-10-10 DIAGNOSIS — Z124 Encounter for screening for malignant neoplasm of cervix: Secondary | ICD-10-CM | POA: Diagnosis not present

## 2023-10-10 DIAGNOSIS — L900 Lichen sclerosus et atrophicus: Secondary | ICD-10-CM

## 2023-10-10 MED ORDER — CLOBETASOL PROPIONATE 0.05 % EX OINT: 1.0000 | TOPICAL_OINTMENT | Freq: Two times a day (BID) | CUTANEOUS | 0 refills | Status: AC | PRN

## 2023-10-10 NOTE — Progress Notes (Signed)
        Established patient visit  History, exam, impression, and plan:  1. Cervical cancer screening (Primary) Pleasant 38 year old female presenting today for completion of cervical cancer screening.  Her last Pap smear was approximately 3 years ago with abnormal results.  We are updating today with HPV cotesting.  Declines STD testing.  No concerning symptoms at present. - Cytology - PAP  GYN:  External genitalia within normal limits.  Vaginal mucosa pink, moist, normal rugae.  Nonfriable cervix without lesions, no discharge or bleeding noted on speculum exam.  Bimanual exam revealed normal, nongravid uterus.  No cervical motion tenderness. No adnexal masses bilaterally.  Isla Pence, CMA present as chaperone.   2. Lichen sclerosus Reports an area of itchiness along the right side of the vaginal introitus that has been present for greater than 10 years.  This area is intermittently itchy but has not had any changes.  Occasionally uses Aquaphor but has used clobetasol in the past which was helpful.  She has a little clobetasol at home however it is very much expired.  Reordering today.  Procedures performed this visit: None.  Return if symptoms worsen or fail to improve.  __________________________________ Thayer Ohm, DNP, APRN, FNP-BC Primary Care and Sports Medicine [facility]

## 2023-10-12 ENCOUNTER — Encounter: Payer: Self-pay | Admitting: Medical-Surgical

## 2023-10-12 LAB — CYTOLOGY - PAP
Comment: NEGATIVE
Diagnosis: NEGATIVE
High risk HPV: NEGATIVE

## 2023-10-31 ENCOUNTER — Telehealth: Admitting: Physician Assistant

## 2023-10-31 DIAGNOSIS — B9689 Other specified bacterial agents as the cause of diseases classified elsewhere: Secondary | ICD-10-CM

## 2023-10-31 DIAGNOSIS — J019 Acute sinusitis, unspecified: Secondary | ICD-10-CM | POA: Diagnosis not present

## 2023-10-31 MED ORDER — AMOXICILLIN-POT CLAVULANATE 875-125 MG PO TABS
1.0000 | ORAL_TABLET | Freq: Two times a day (BID) | ORAL | 0 refills | Status: DC
Start: 2023-10-31 — End: 2024-02-13

## 2023-10-31 NOTE — Progress Notes (Signed)

## 2023-11-07 ENCOUNTER — Other Ambulatory Visit: Payer: Self-pay

## 2023-11-28 ENCOUNTER — Other Ambulatory Visit (HOSPITAL_COMMUNITY): Payer: Self-pay

## 2023-11-28 ENCOUNTER — Other Ambulatory Visit: Payer: Self-pay | Admitting: Neurology

## 2023-11-29 ENCOUNTER — Other Ambulatory Visit: Payer: Self-pay

## 2023-12-06 ENCOUNTER — Telehealth: Payer: Self-pay

## 2023-12-06 ENCOUNTER — Other Ambulatory Visit: Payer: Self-pay

## 2023-12-06 ENCOUNTER — Encounter: Payer: Self-pay | Admitting: Neurology

## 2023-12-06 NOTE — Telephone Encounter (Signed)
 pyridostigmine  (60 MG) no longer covered [place]. Can we please get a PA done for patient?

## 2023-12-07 ENCOUNTER — Other Ambulatory Visit (HOSPITAL_COMMUNITY): Payer: Self-pay

## 2023-12-07 NOTE — Telephone Encounter (Signed)
 Called patients insurance to see what is going on with patients Mestinon . Was informed by [facility] that insurance is requiring she fill Rx at a [facility] pharmacy.   Will inform patient on Mychart.

## 2023-12-10 ENCOUNTER — Other Ambulatory Visit (HOSPITAL_COMMUNITY): Payer: Self-pay

## 2023-12-10 ENCOUNTER — Other Ambulatory Visit: Payer: Self-pay

## 2023-12-10 MED ORDER — PYRIDOSTIGMINE BROMIDE 60 MG PO TABS
60.0000 mg | ORAL_TABLET | Freq: Every day | ORAL | 11 refills | Status: AC
Start: 2023-12-10 — End: ?
  Filled 2023-12-10: qty 60, 30d supply, fill #0
  Filled 2024-02-08: qty 60, 30d supply, fill #1
  Filled 2024-03-25: qty 60, 30d supply, fill #2
  Filled 2024-05-27: qty 60, 30d supply, fill #3

## 2024-01-09 ENCOUNTER — Other Ambulatory Visit: Payer: Self-pay

## 2024-01-24 ENCOUNTER — Other Ambulatory Visit: Payer: Self-pay | Admitting: Medical-Surgical

## 2024-02-08 ENCOUNTER — Other Ambulatory Visit: Payer: Self-pay

## 2024-02-13 ENCOUNTER — Ambulatory Visit: Payer: 59 | Admitting: Medical-Surgical

## 2024-02-13 ENCOUNTER — Encounter: Payer: Self-pay | Admitting: Medical-Surgical

## 2024-02-13 ENCOUNTER — Other Ambulatory Visit (HOSPITAL_COMMUNITY): Payer: Self-pay

## 2024-02-13 VITALS — BP 90/49 | HR 78 | Resp 20 | Ht 64.0 in | Wt 144.2 lb

## 2024-02-13 DIAGNOSIS — R5383 Other fatigue: Secondary | ICD-10-CM | POA: Diagnosis not present

## 2024-02-13 DIAGNOSIS — Z3041 Encounter for surveillance of contraceptive pills: Secondary | ICD-10-CM

## 2024-02-13 DIAGNOSIS — G43009 Migraine without aura, not intractable, without status migrainosus: Secondary | ICD-10-CM

## 2024-02-13 DIAGNOSIS — G7000 Myasthenia gravis without (acute) exacerbation: Secondary | ICD-10-CM | POA: Diagnosis not present

## 2024-02-13 DIAGNOSIS — F32A Depression, unspecified: Secondary | ICD-10-CM | POA: Diagnosis not present

## 2024-02-13 DIAGNOSIS — Z8659 Personal history of other mental and behavioral disorders: Secondary | ICD-10-CM

## 2024-02-13 MED ORDER — PAROXETINE HCL 20 MG PO TABS
20.0000 mg | ORAL_TABLET | Freq: Every day | ORAL | 3 refills | Status: AC
  Filled 2024-02-13: qty 4, 4d supply, fill #0
  Filled 2024-02-13: qty 86, 86d supply, fill #0
  Filled 2024-05-27: qty 90, 90d supply, fill #1

## 2024-02-13 MED ORDER — ARMODAFINIL 250 MG PO TABS
250.0000 mg | ORAL_TABLET | Freq: Every day | ORAL | 5 refills | Status: DC
  Filled 2024-02-13 (×2): qty 30, 30d supply, fill #0

## 2024-02-13 MED ORDER — NORETHIN ACE-ETH ESTRAD-FE 1-20 MG-MCG PO TABS
1.0000 | ORAL_TABLET | Freq: Every day | ORAL | 4 refills | Status: AC
  Filled 2024-02-13: qty 84, 84d supply, fill #0
  Filled 2024-05-11: qty 84, 84d supply, fill #1
  Filled 2024-08-01: qty 84, 84d supply, fill #2

## 2024-02-13 NOTE — Progress Notes (Signed)
 Established patient visit   History of Present Illness   Discussed the use of AI scribe software for clinical note transcription with the patient, who gave verbal consent to proceed.  History of Present Illness   Robin Robertson is a 38 year old female with myasthenia gravis who presents with worsening daytime fatigue and sleep disturbances.  She experiences increasing daytime fatigue despite taking armodafinil , which loses effectiveness between 3 and 5 PM. She often requires a nap after work, but this does not fully alleviate her fatigue. She feels she could go to bed as early as 5 PM if not for family obligations.  She has significant difficulty with sleep at night, with racing thoughts preventing her from falling asleep despite feeling tired. She often stays up late doing household chores due to her inability to sleep. She has not taken any medication to aid sleep, and this issue has only arisen recently.  She reports irregular menstrual cycles, night sweats, and feeling hot during the day, raising concerns about hormonal changes or perimenopause. Her fatigue and muscle weakness have been more pronounced since having COVID-19 in January 2024. She recalls falling asleep while vacuuming, which prompted her to seek medical attention.  Current medications include armodafinil  for fatigue related to myasthenia gravis, Paxil , which she feels is still effective, and birth control, preferring a three-month supply if possible.        Physical Exam   Physical Exam Vitals reviewed.  Constitutional:      General: She is not in acute distress.    Appearance: Normal appearance. She is not ill-appearing.  HENT:     Head: Normocephalic and atraumatic.  Cardiovascular:     Rate and Rhythm: Normal rate and regular rhythm.     Pulses: Normal pulses.     Heart sounds: Normal heart sounds. No murmur heard.    No friction rub. No gallop.  Pulmonary:     Effort: Pulmonary effort is normal.  No respiratory distress.     Breath sounds: Normal breath sounds. No wheezing.  Skin:    General: Skin is warm and dry.  Neurological:     Mental Status: She is alert and oriented to person, place, and time.  Psychiatric:        Mood and Affect: Mood normal.        Behavior: Behavior normal. Behavior is cooperative.        Thought Content: Thought content normal.        Judgment: Judgment normal.     Assessment & Plan   Assessment and Plan    Fatigue and hypersomnolence Chronic fatigue and hypersomnolence, worsened post-COVID. Armodafinil  effective until mid-afternoon. Differential includes sleep disorder or idiopathic hypersomnolence. Discussed potential off-label use of Adderall or Vyvanse . - Refill armodafinil  prescription. - Investigate Adderall, Vyvanse , or Sunosi for better management of daytime sleepiness. - Consider contacting her neurologist to inquire about sleep disorder evaluation.  Insomnia Recent onset insomnia, possibly due to hormonal fluctuations or stress. - Investigate potential sleep aids if necessary.  Perimenopausal symptoms Symptoms suggestive of perimenopause, including irregular cycles and night sweats. Hormonal fluctuations may contribute to fatigue and insomnia. - Monitor symptoms and consider hormonal evaluation if symptoms persist.  Migraine Recent exacerbation of migraines, possibly related to weather changes.  Myasthenia gravis Well-controlled. Fatigue noted. - Follow up with Neurology as instructed.  - Continue Mestinon .  Depression Well-managed on Paxil . No current depressive symptoms. - Continue Paxil  as prescribed.  Follow up   Return in about 6 months (around 08/15/2024) for chronic disease follow up.  __________________________________ Zada FREDRIK Palin, DNP, APRN, FNP-BC Primary Care and Sports Medicine [facility]

## 2024-02-18 ENCOUNTER — Other Ambulatory Visit (HOSPITAL_COMMUNITY): Payer: Self-pay

## 2024-02-18 MED ORDER — LISDEXAMFETAMINE DIMESYLATE 30 MG PO CAPS
30.0000 mg | ORAL_CAPSULE | Freq: Every day | ORAL | 0 refills | Status: DC
  Filled 2024-02-18: qty 30, 30d supply, fill #0

## 2024-02-20 ENCOUNTER — Other Ambulatory Visit (HOSPITAL_COMMUNITY): Payer: Self-pay

## 2024-03-24 ENCOUNTER — Encounter: Payer: Self-pay | Admitting: Medical-Surgical

## 2024-03-25 ENCOUNTER — Other Ambulatory Visit: Payer: Self-pay

## 2024-03-25 ENCOUNTER — Other Ambulatory Visit: Payer: Self-pay | Admitting: Medical-Surgical

## 2024-03-25 NOTE — Telephone Encounter (Signed)
 Last filled 02/18/2024  Last OV 08/132025  Upcoming 08/20/2024

## 2024-03-26 ENCOUNTER — Encounter: Payer: Self-pay | Admitting: Medical-Surgical

## 2024-03-26 ENCOUNTER — Other Ambulatory Visit (HOSPITAL_COMMUNITY): Payer: Self-pay

## 2024-03-26 MED ORDER — LISDEXAMFETAMINE DIMESYLATE 40 MG PO CAPS
40.0000 mg | ORAL_CAPSULE | Freq: Every day | ORAL | 0 refills | Status: DC
  Filled 2024-03-26: qty 30, 30d supply, fill #0

## 2024-03-31 ENCOUNTER — Encounter: Payer: Self-pay | Admitting: Medical-Surgical

## 2024-03-31 ENCOUNTER — Telehealth: Admitting: Medical-Surgical

## 2024-03-31 DIAGNOSIS — R5383 Other fatigue: Secondary | ICD-10-CM

## 2024-03-31 DIAGNOSIS — G7000 Myasthenia gravis without (acute) exacerbation: Secondary | ICD-10-CM | POA: Diagnosis not present

## 2024-03-31 DIAGNOSIS — Z0289 Encounter for other administrative examinations: Secondary | ICD-10-CM | POA: Diagnosis not present

## 2024-03-31 NOTE — Progress Notes (Signed)
 Virtual Visit via Video Note  I connected with Nyala Kirchner Holaway on 03/31/24 at  8:10 AM EDT by a video enabled telemedicine application and verified that I am speaking with the correct person using two identifiers.   I discussed the limitations of evaluation and management by telemedicine and the availability of in person appointments. The patient expressed understanding and agreed to proceed.  Patient location: home Provider locations: office  Subjective:    CC: FMLA paperwork  HPI: Very pleasant 38 year old female presenting via MyChart video visit to discuss FMLA papers.  She has had multiple episodes of profound fatigue in the morning before her stimulant medication kicks in causing her to be at risk for falling asleep behind the wheel.  To prevent this, she has to take frequent stops to help wake up before continuing to work.  This has led to multiple late arrivals and she has received a message of warning for separation of employment if this continues.  At this time, we are currently experiencing with different medications to help with her chronic fatigue and narcolepsy symptoms.  She is also followed by neurology.  Past medical history, Surgical history, Family history not pertinant except as noted below, Social history, Allergies, and medications have been entered into the medical record, reviewed, and corrections made.   Review of Systems: See HPI for pertinent positives and negatives.   Objective:    General: Speaking clearly in complete sentences without any shortness of breath.  Alert and oriented x3.  Normal judgment. No apparent acute distress.  Impression and Recommendations:    1. Myasthenia gravis (HCC) (Primary) 2. Fatigue, unspecified type 3. Encounter for completion of form with patient Discussed FMLA paperwork.  We are filling this out for intermittent FMLA to account for up to 1 hour 1-4 times weekly so there is no penalty for later rivals.  She has several dates  since May and we will send those dates to me via MyChart.  She was told we can only backdate the FMLA paperwork for 1 month but I will try to get these dates included.  I discussed the assessment and treatment plan with the patient. The patient was provided an opportunity to ask questions and all were answered. The patient agreed with the plan and demonstrated an understanding of the instructions.   The patient was advised to call back or seek an in-person evaluation if the symptoms worsen or if the condition fails to improve as anticipated.  Return if symptoms worsen or fail to improve.  Zada FREDRIK Palin, DNP, APRN, FNP-BC [facility]

## 2024-04-23 ENCOUNTER — Other Ambulatory Visit: Payer: Self-pay | Admitting: Medical-Surgical

## 2024-04-24 ENCOUNTER — Other Ambulatory Visit: Payer: Self-pay | Admitting: Medical-Surgical

## 2024-04-24 MED ORDER — LISDEXAMFETAMINE DIMESYLATE 40 MG PO CAPS
40.0000 mg | ORAL_CAPSULE | Freq: Every day | ORAL | 0 refills | Status: DC
  Filled 2024-04-24: qty 30, 30d supply, fill #0

## 2024-04-24 MED ORDER — LISDEXAMFETAMINE DIMESYLATE 40 MG PO CAPS
40.0000 mg | ORAL_CAPSULE | Freq: Every day | ORAL | 0 refills | Status: AC
  Filled 2024-05-27: qty 30, 30d supply, fill #0

## 2024-04-24 MED ORDER — LISDEXAMFETAMINE DIMESYLATE 40 MG PO CAPS
40.0000 mg | ORAL_CAPSULE | Freq: Every day | ORAL | 0 refills | Status: DC
  Filled 2024-06-30: qty 30, 30d supply, fill #0

## 2024-04-24 NOTE — Telephone Encounter (Signed)
 Last filled 03/26/2024  Last OV 03/31/2024  Upcoming appointment 08/20/2024

## 2024-04-25 ENCOUNTER — Other Ambulatory Visit (HOSPITAL_COMMUNITY): Payer: Self-pay

## 2024-05-12 ENCOUNTER — Other Ambulatory Visit (HOSPITAL_COMMUNITY): Payer: Self-pay

## 2024-05-27 ENCOUNTER — Other Ambulatory Visit (HOSPITAL_COMMUNITY): Payer: Self-pay

## 2024-05-28 ENCOUNTER — Encounter: Payer: Self-pay | Admitting: Neurology

## 2024-05-28 ENCOUNTER — Ambulatory Visit: Payer: 59 | Admitting: Neurology

## 2024-05-28 VITALS — BP 114/72 | HR 67 | Ht 64.0 in | Wt 138.0 lb

## 2024-05-28 DIAGNOSIS — G7001 Myasthenia gravis with (acute) exacerbation: Secondary | ICD-10-CM | POA: Diagnosis not present

## 2024-05-28 NOTE — Progress Notes (Signed)
 Follow-up Visit   Date: 05/28/24   Robin Robertson MRN: [id number] DOB: 1985/07/19   Interim History: Robin Robertson is a 38 y.o. right-handed Caucasian female with seropositive gravis (2013) returning to the clinic for follow-up of myasthenia gravis.  The patient was accompanied to the clinic by self.   IMPRESSION/PLAN: Seropositive myasthenia gravis with mild exacerbation (right ptosis), diagnosed 2013.  Exam is stable with moderate right ptosis.  She has not been on prednisone .  - Continue mestinon  60mg  1 tablet daily and take extra tablet as needed for breakthrough symptoms.  Return to clinic in 1 year  -----------------------------------------------------------  History of present illness: She was diagnosed with MG by antibody testing in ~ 2013 when living in [place], [place] .  These records are not available to review.  Fortunately, her symptoms have predominately been ocular and she has not needed anything more than mestinon  60mg  2-3 times daily.  She reports having has generalized fatigue, right eye droopiness, shortness of breath, palpitation, headaches.  She sleeps on one pillow. She tends to get a lot of URI and feels that her weakness is worse when sick.    She takes mestinon  60mg  twice daily.  She has never been on prednisone .    She had never had MG crisis, required IVIG, or plasmapheresis.   UPDATE 05/28/2024:  Discussed the use of AI scribe software for clinical note transcription with the patient, who gave verbal consent to proceed.  History of Present Illness Approximately two weeks ago, she experienced a significant episode of fatigue and weakness while using a leaf blower and picking up sticks. She described the sensation as a 'wave of like, like everything just went out.' She went inside to rest and felt her arms were 'like jello' for the remainder of the day. To manage this, she took an extra dose of Mestinon  in addition to her regular morning and  evening doses.  She typically takes Mestinon  once daily, but occasionally takes an extra dose at lunchtime if she has a particularly busy day and feels tired. She estimates this happens infrequently, and could count on probably one hand the amount of times she's had to do that.  No issues with swallowing, talking, or experiencing double vision. However, her vision feels more strained lately, particularly at the end of the workday, though she does not describe it as double vision.  She has been on Vyvanse  for the past three to four months, which she feels has helped with her overall energy levels.    Medications:  Current Outpatient Medications on File Prior to Visit  Medication Sig Dispense Refill   albuterol  (VENTOLIN  HFA) 108 (90 Base) MCG/ACT inhaler Inhale 2 puffs by mouth into the lungs every 6 (six) hours as needed for wheezing. 6.7 g 5   clobetasol  ointment (TEMOVATE ) 0.05 % Apply 1 Application topically 2 (two) times daily as needed. 30 g 0   fluticasone  (FLOVENT  HFA) 110 MCG/ACT inhaler Inhale 2 puffs into the lungs 2 (two) times daily. For cough 1 each 0   lisdexamfetamine (VYVANSE ) 40 MG capsule Take 1 capsule (40 mg total) by mouth daily. 30 capsule 0   [START ON 06/23/2024] lisdexamfetamine (VYVANSE ) 40 MG capsule Take 1 capsule (40 mg total) by mouth daily. 30 capsule 0   norethindrone -ethinyl estradiol -FE (JUNEL  FE 1/20) 1-20 MG-MCG tablet Take 1 tablet by mouth daily. 84 tablet 4   PARoxetine  (PAXIL ) 20 MG tablet Take 1 tablet (20 mg total) by mouth daily. 90 tablet 3  pyridostigmine  (MESTINON ) 60 MG tablet Take 1 tablet once a day. Take second dose if needed. 60 tablet 11   No current facility-administered medications on file prior to visit.    Allergies:  Allergies  Allergen Reactions   Fentanyl Anaphylaxis    Stopped breathing, heart rate dropped to 8bpm   Influenza Vaccines Shortness Of Breath    Chest pain, Left side of body went numb for 2 weeks   Vicodin  [Hydrocodone-Acetaminophen ] Rash    Anger and Black outs    Vital Signs:  BP 114/72   Pulse 67   Ht 5' 4 (1.626 m)   Wt 138 lb (62.6 kg)   SpO2 98%   BMI 23.69 kg/m    Neurological Exam: MENTAL STATUS including orientation to time, place, person, recent and remote memory, attention span and concentration, language, and fund of knowledge is normal.  Speech is not dysarthric.  CRANIAL NERVES: Pupils equal round and reactive to light.  Normal conjugate, extra-ocular eye movements in all directions of gaze.  Moderate right ptosis at baseline, no worsening with sustained upgaze.  Face is symmetric. Palate elevates symmetrically.  Tongue is midline.  MOTOR:  Motor strength is 5/5 in all extremities.  No atrophy, fasciculations or abnormal movements.  No pronator drift.  Tone is normal.    COORDINATION/GAIT:   She is able to stand up from chair without using arms.   Gait narrow based and stable.   Data: CT chest w contrast 02/06/2021:  No thymoma   Thank you for allowing me to participate in patient's care.  If I can answer any additional questions, I would be pleased to do so.    Sincerely,    Jakaya Jacobowitz K. Tobie, DO

## 2024-07-01 ENCOUNTER — Other Ambulatory Visit (HOSPITAL_COMMUNITY): Payer: Self-pay

## 2024-07-22 ENCOUNTER — Encounter (HOSPITAL_COMMUNITY): Payer: Self-pay

## 2024-07-22 ENCOUNTER — Other Ambulatory Visit (HOSPITAL_COMMUNITY): Payer: Self-pay

## 2024-07-22 MED ORDER — AMOXICILLIN 500 MG PO CAPS
500.0000 mg | ORAL_CAPSULE | Freq: Three times a day (TID) | ORAL | 0 refills | Status: AC
  Filled 2024-07-22: qty 21, 7d supply, fill #0

## 2024-08-01 ENCOUNTER — Other Ambulatory Visit: Payer: Self-pay

## 2024-08-01 ENCOUNTER — Other Ambulatory Visit (HOSPITAL_COMMUNITY): Payer: Self-pay

## 2024-08-01 ENCOUNTER — Other Ambulatory Visit: Payer: Self-pay | Admitting: Medical-Surgical

## 2024-08-05 ENCOUNTER — Encounter: Payer: Self-pay | Admitting: Medical-Surgical

## 2024-08-05 ENCOUNTER — Other Ambulatory Visit (HOSPITAL_COMMUNITY): Payer: Self-pay

## 2024-08-05 MED ORDER — LISDEXAMFETAMINE DIMESYLATE 40 MG PO CAPS
40.0000 mg | ORAL_CAPSULE | Freq: Every day | ORAL | 0 refills | Status: AC
  Filled 2024-08-05: qty 30, 30d supply, fill #0

## 2024-08-20 ENCOUNTER — Ambulatory Visit: Admitting: Medical-Surgical

## 2025-06-01 ENCOUNTER — Ambulatory Visit: Admitting: Neurology
